# Patient Record
Sex: Male | Born: 1935 | Race: White | Hispanic: No | Smoking: Former smoker
Health system: Southern US, Community
[De-identification: ages and names within clinical notes are randomized; demographics above are authoritative.]

## PROBLEM LIST (undated history)

## (undated) DIAGNOSIS — F0390 Unspecified dementia without behavioral disturbance: Secondary | ICD-10-CM

## (undated) DIAGNOSIS — I2510 Atherosclerotic heart disease of native coronary artery without angina pectoris: Secondary | ICD-10-CM

## (undated) DIAGNOSIS — I213 ST elevation (STEMI) myocardial infarction of unspecified site: Secondary | ICD-10-CM

## (undated) DIAGNOSIS — E785 Hyperlipidemia, unspecified: Secondary | ICD-10-CM

## (undated) DIAGNOSIS — I10 Essential (primary) hypertension: Secondary | ICD-10-CM

## (undated) DIAGNOSIS — I4821 Permanent atrial fibrillation: Secondary | ICD-10-CM

## (undated) DIAGNOSIS — E119 Type 2 diabetes mellitus without complications: Secondary | ICD-10-CM

## (undated) DIAGNOSIS — I350 Nonrheumatic aortic (valve) stenosis: Secondary | ICD-10-CM

## (undated) HISTORY — PX: INGUINAL HERNIA REPAIR: SHX194

## (undated) HISTORY — DX: Essential (primary) hypertension: I10

## (undated) HISTORY — DX: Nonrheumatic aortic (valve) stenosis: I35.0

## (undated) HISTORY — DX: Atherosclerotic heart disease of native coronary artery without angina pectoris: I25.10

## (undated) HISTORY — DX: Hyperlipidemia, unspecified: E78.5

## (undated) HISTORY — PX: HERNIA REPAIR: SHX51

---

## 1996-09-07 HISTORY — PX: CORONARY ARTERY BYPASS GRAFT: SHX141

## 2002-07-17 ENCOUNTER — Ambulatory Visit (HOSPITAL_COMMUNITY): Admission: RE | Admit: 2002-07-17 | Discharge: 2002-07-17 | Payer: Self-pay | Admitting: Family Medicine

## 2002-07-17 ENCOUNTER — Encounter: Payer: Self-pay | Admitting: Family Medicine

## 2002-08-18 ENCOUNTER — Ambulatory Visit (HOSPITAL_COMMUNITY): Admission: RE | Admit: 2002-08-18 | Discharge: 2002-08-18 | Payer: Self-pay | Admitting: Cardiology

## 2002-08-18 ENCOUNTER — Encounter: Payer: Self-pay | Admitting: Cardiology

## 2002-08-30 ENCOUNTER — Encounter (HOSPITAL_COMMUNITY): Admission: RE | Admit: 2002-08-30 | Discharge: 2002-09-29 | Payer: Self-pay | Admitting: *Deleted

## 2002-08-30 ENCOUNTER — Encounter: Payer: Self-pay | Admitting: *Deleted

## 2002-09-18 ENCOUNTER — Encounter: Payer: Self-pay | Admitting: *Deleted

## 2002-09-22 ENCOUNTER — Encounter: Payer: Self-pay | Admitting: *Deleted

## 2002-09-22 ENCOUNTER — Ambulatory Visit (HOSPITAL_COMMUNITY): Admission: RE | Admit: 2002-09-22 | Discharge: 2002-09-23 | Payer: Self-pay | Admitting: *Deleted

## 2002-09-22 ENCOUNTER — Encounter (INDEPENDENT_AMBULATORY_CARE_PROVIDER_SITE_OTHER): Payer: Self-pay | Admitting: *Deleted

## 2003-03-21 ENCOUNTER — Emergency Department (HOSPITAL_COMMUNITY): Admission: EM | Admit: 2003-03-21 | Discharge: 2003-03-21 | Payer: Self-pay | Admitting: Emergency Medicine

## 2003-10-01 ENCOUNTER — Other Ambulatory Visit: Admission: RE | Admit: 2003-10-01 | Discharge: 2003-10-01 | Payer: Self-pay | Admitting: Dermatology

## 2004-02-19 ENCOUNTER — Ambulatory Visit: Payer: Self-pay | Admitting: *Deleted

## 2004-02-20 ENCOUNTER — Ambulatory Visit (HOSPITAL_COMMUNITY): Admission: RE | Admit: 2004-02-20 | Discharge: 2004-02-20 | Payer: Self-pay | Admitting: Family Medicine

## 2005-12-10 ENCOUNTER — Ambulatory Visit: Payer: Self-pay | Admitting: Cardiovascular Disease

## 2005-12-17 ENCOUNTER — Ambulatory Visit: Payer: Self-pay | Admitting: Cardiovascular Disease

## 2005-12-17 ENCOUNTER — Ambulatory Visit (HOSPITAL_COMMUNITY): Admission: RE | Admit: 2005-12-17 | Discharge: 2005-12-18 | Payer: Self-pay | Admitting: Cardiovascular Disease

## 2005-12-22 ENCOUNTER — Ambulatory Visit: Payer: Self-pay | Admitting: Cardiovascular Disease

## 2005-12-23 ENCOUNTER — Ambulatory Visit: Payer: Self-pay | Admitting: Cardiovascular Disease

## 2006-03-16 ENCOUNTER — Ambulatory Visit: Payer: Self-pay | Admitting: Cardiology

## 2006-03-16 ENCOUNTER — Ambulatory Visit (HOSPITAL_COMMUNITY): Admission: RE | Admit: 2006-03-16 | Discharge: 2006-03-17 | Payer: Self-pay | Admitting: Cardiology

## 2006-04-02 ENCOUNTER — Ambulatory Visit: Payer: Self-pay | Admitting: Cardiovascular Disease

## 2006-04-14 ENCOUNTER — Encounter (HOSPITAL_COMMUNITY): Admission: RE | Admit: 2006-04-14 | Discharge: 2006-05-14 | Payer: Self-pay | Admitting: Cardiology

## 2006-04-27 ENCOUNTER — Inpatient Hospital Stay (HOSPITAL_COMMUNITY): Admission: EM | Admit: 2006-04-27 | Discharge: 2006-04-29 | Payer: Self-pay | Admitting: Emergency Medicine

## 2006-04-27 ENCOUNTER — Ambulatory Visit: Payer: Self-pay | Admitting: Internal Medicine

## 2006-05-19 ENCOUNTER — Encounter (HOSPITAL_COMMUNITY): Admission: RE | Admit: 2006-05-19 | Discharge: 2006-06-18 | Payer: Self-pay | Admitting: Cardiology

## 2006-06-03 ENCOUNTER — Ambulatory Visit: Payer: Self-pay | Admitting: Cardiology

## 2006-06-18 ENCOUNTER — Encounter (HOSPITAL_COMMUNITY): Admission: RE | Admit: 2006-06-18 | Discharge: 2006-07-18 | Payer: Self-pay | Admitting: Cardiology

## 2006-07-19 ENCOUNTER — Encounter (HOSPITAL_COMMUNITY): Admission: RE | Admit: 2006-07-19 | Discharge: 2006-08-18 | Payer: Self-pay | Admitting: Cardiology

## 2006-08-03 ENCOUNTER — Ambulatory Visit: Payer: Self-pay | Admitting: Cardiology

## 2006-08-03 LAB — CONVERTED CEMR LAB
Basophils Absolute: 0 10*3/uL (ref 0.0–0.1)
Basophils Relative: 0.1 % (ref 0.0–1.0)
Eosinophils Absolute: 0.3 10*3/uL (ref 0.0–0.6)
Eosinophils Relative: 4.1 % (ref 0.0–5.0)
Hemoglobin: 14.2 g/dL (ref 13.0–17.0)
MCV: 96.7 fL (ref 78.0–100.0)
Monocytes Relative: 6.6 % (ref 3.0–11.0)
Neutro Abs: 5.6 10*3/uL (ref 1.4–7.7)
Neutrophils Relative %: 76.2 % (ref 43.0–77.0)
RBC: 4.22 M/uL (ref 4.22–5.81)

## 2007-01-19 ENCOUNTER — Ambulatory Visit: Payer: Self-pay | Admitting: Cardiology

## 2007-01-19 LAB — CONVERTED CEMR LAB
ALT: 21 units/L (ref 0–53)
AST: 26 units/L (ref 0–37)
Alkaline Phosphatase: 84 units/L (ref 39–117)
Cholesterol: 135 mg/dL (ref 0–200)
Total CHOL/HDL Ratio: 3.8

## 2007-03-21 ENCOUNTER — Ambulatory Visit: Payer: Self-pay | Admitting: Cardiology

## 2007-03-21 LAB — CONVERTED CEMR LAB
Bilirubin, Direct: 0.1 mg/dL (ref 0.0–0.3)
Cholesterol: 136 mg/dL (ref 0–200)
Total Bilirubin: 1.1 mg/dL (ref 0.3–1.2)
Total Protein: 6.5 g/dL (ref 6.0–8.3)
VLDL: 21 mg/dL (ref 0–40)

## 2007-07-06 ENCOUNTER — Ambulatory Visit (HOSPITAL_COMMUNITY): Admission: RE | Admit: 2007-07-06 | Discharge: 2007-07-06 | Payer: Self-pay | Admitting: Family Medicine

## 2007-07-15 ENCOUNTER — Ambulatory Visit: Payer: Self-pay | Admitting: Cardiology

## 2007-07-20 ENCOUNTER — Ambulatory Visit: Payer: Self-pay | Admitting: Cardiology

## 2007-09-14 ENCOUNTER — Ambulatory Visit: Payer: Self-pay | Admitting: Cardiology

## 2008-02-06 ENCOUNTER — Ambulatory Visit (HOSPITAL_COMMUNITY): Admission: RE | Admit: 2008-02-06 | Discharge: 2008-02-06 | Payer: Self-pay | Admitting: Family Medicine

## 2008-03-12 ENCOUNTER — Ambulatory Visit: Payer: Self-pay | Admitting: Cardiology

## 2008-03-12 LAB — CONVERTED CEMR LAB
ALT: 17 units/L (ref 0–53)
AST: 20 units/L (ref 0–37)
Alkaline Phosphatase: 55 units/L (ref 39–117)
LDL Cholesterol: 71 mg/dL (ref 0–99)
Total Protein: 6.3 g/dL (ref 6.0–8.3)

## 2008-03-26 ENCOUNTER — Ambulatory Visit: Payer: Self-pay | Admitting: Cardiology

## 2008-08-20 ENCOUNTER — Telehealth: Payer: Self-pay | Admitting: Cardiology

## 2008-09-11 ENCOUNTER — Ambulatory Visit: Payer: Self-pay | Admitting: Cardiology

## 2008-09-11 ENCOUNTER — Encounter: Payer: Self-pay | Admitting: Cardiology

## 2008-09-11 ENCOUNTER — Ambulatory Visit: Payer: Self-pay

## 2008-09-11 DIAGNOSIS — I350 Nonrheumatic aortic (valve) stenosis: Secondary | ICD-10-CM

## 2008-09-11 DIAGNOSIS — I2510 Atherosclerotic heart disease of native coronary artery without angina pectoris: Secondary | ICD-10-CM

## 2008-09-11 DIAGNOSIS — E782 Mixed hyperlipidemia: Secondary | ICD-10-CM

## 2008-09-19 ENCOUNTER — Telehealth (INDEPENDENT_AMBULATORY_CARE_PROVIDER_SITE_OTHER): Payer: Self-pay | Admitting: *Deleted

## 2008-11-23 ENCOUNTER — Emergency Department (HOSPITAL_COMMUNITY): Admission: EM | Admit: 2008-11-23 | Discharge: 2008-11-23 | Payer: Self-pay | Admitting: Emergency Medicine

## 2008-11-26 ENCOUNTER — Ambulatory Visit: Payer: Self-pay | Admitting: Cardiology

## 2008-11-26 ENCOUNTER — Inpatient Hospital Stay (HOSPITAL_COMMUNITY): Admission: EM | Admit: 2008-11-26 | Discharge: 2008-11-29 | Payer: Self-pay | Admitting: Emergency Medicine

## 2008-12-06 ENCOUNTER — Ambulatory Visit (HOSPITAL_COMMUNITY): Admission: RE | Admit: 2008-12-06 | Discharge: 2008-12-06 | Payer: Self-pay | Admitting: Family Medicine

## 2008-12-19 ENCOUNTER — Telehealth: Payer: Self-pay | Admitting: Cardiology

## 2008-12-19 ENCOUNTER — Encounter: Payer: Self-pay | Admitting: Cardiology

## 2009-01-16 ENCOUNTER — Encounter (INDEPENDENT_AMBULATORY_CARE_PROVIDER_SITE_OTHER): Payer: Self-pay | Admitting: *Deleted

## 2009-01-17 ENCOUNTER — Ambulatory Visit: Payer: Self-pay | Admitting: Cardiology

## 2009-01-17 DIAGNOSIS — I4891 Unspecified atrial fibrillation: Secondary | ICD-10-CM

## 2009-01-23 ENCOUNTER — Encounter: Payer: Self-pay | Admitting: Cardiology

## 2009-01-25 ENCOUNTER — Telehealth: Payer: Self-pay | Admitting: Cardiology

## 2009-01-30 ENCOUNTER — Telehealth (INDEPENDENT_AMBULATORY_CARE_PROVIDER_SITE_OTHER): Payer: Self-pay | Admitting: *Deleted

## 2009-01-30 ENCOUNTER — Encounter (INDEPENDENT_AMBULATORY_CARE_PROVIDER_SITE_OTHER): Payer: Self-pay | Admitting: *Deleted

## 2009-01-30 ENCOUNTER — Ambulatory Visit: Payer: Self-pay | Admitting: Cardiology

## 2009-02-04 LAB — CONVERTED CEMR LAB
CO2: 32 meq/L (ref 19–32)
Calcium: 8.8 mg/dL (ref 8.4–10.5)
Glucose, Bld: 77 mg/dL (ref 70–99)
Sodium: 142 meq/L (ref 135–145)

## 2009-03-27 ENCOUNTER — Ambulatory Visit: Payer: Self-pay | Admitting: Cardiology

## 2009-04-10 ENCOUNTER — Ambulatory Visit: Payer: Self-pay | Admitting: Cardiology

## 2009-06-10 ENCOUNTER — Ambulatory Visit: Payer: Self-pay | Admitting: Cardiology

## 2009-09-16 ENCOUNTER — Ambulatory Visit (HOSPITAL_COMMUNITY): Admission: RE | Admit: 2009-09-16 | Discharge: 2009-09-16 | Payer: Self-pay | Admitting: Cardiology

## 2009-09-16 ENCOUNTER — Ambulatory Visit: Payer: Self-pay

## 2009-09-16 ENCOUNTER — Encounter: Payer: Self-pay | Admitting: Cardiology

## 2009-09-16 ENCOUNTER — Ambulatory Visit: Payer: Self-pay | Admitting: Internal Medicine

## 2009-09-16 ENCOUNTER — Ambulatory Visit: Payer: Self-pay | Admitting: Cardiology

## 2009-09-20 ENCOUNTER — Ambulatory Visit: Payer: Self-pay | Admitting: Cardiology

## 2009-09-23 LAB — CONVERTED CEMR LAB
BUN: 19 mg/dL (ref 6–23)
CO2: 31 meq/L (ref 19–32)
Calcium: 8.6 mg/dL (ref 8.4–10.5)
Chloride: 107 meq/L (ref 96–112)
Creatinine, Ser: 1.3 mg/dL (ref 0.4–1.5)
Glucose, Bld: 87 mg/dL (ref 70–99)

## 2009-10-04 ENCOUNTER — Ambulatory Visit (HOSPITAL_COMMUNITY): Admission: RE | Admit: 2009-10-04 | Discharge: 2009-10-04 | Payer: Self-pay | Admitting: Internal Medicine

## 2010-03-25 ENCOUNTER — Encounter: Payer: Self-pay | Admitting: Cardiology

## 2010-03-25 ENCOUNTER — Ambulatory Visit: Payer: Self-pay | Admitting: Cardiology

## 2010-03-26 ENCOUNTER — Encounter: Payer: Self-pay | Admitting: Cardiology

## 2010-04-01 LAB — CONVERTED CEMR LAB
ALT: 15 units/L (ref 0–53)
Alkaline Phosphatase: 97 units/L (ref 39–117)
BUN: 17 mg/dL (ref 6–23)
CO2: 31 meq/L (ref 19–32)
Cholesterol: 143 mg/dL (ref 0–200)
Indirect Bilirubin: 0.4 mg/dL (ref 0.0–0.9)
LDL Cholesterol: 78 mg/dL (ref 0–99)
Sodium: 144 meq/L (ref 135–145)
Total Protein: 6.8 g/dL (ref 6.0–8.3)
Triglycerides: 122 mg/dL (ref <150)

## 2010-05-11 LAB — CONVERTED CEMR LAB
BUN: 24 mg/dL — ABNORMAL HIGH (ref 6–23)
Basophils Relative: 0.9 % (ref 0.0–3.0)
CO2: 33 meq/L — ABNORMAL HIGH (ref 19–32)
CO2: 34 meq/L — ABNORMAL HIGH (ref 19–32)
Chloride: 99 meq/L (ref 96–112)
Creatinine, Ser: 1.4 mg/dL (ref 0.4–1.5)
Eosinophils Absolute: 0.4 10*3/uL (ref 0.0–0.7)
Eosinophils Relative: 7.6 % — ABNORMAL HIGH (ref 0.0–5.0)
GFR calc non Af Amer: 51.39 mL/min (ref >60)
GFR calc non Af Amer: 61.91 mL/min (ref >60)
Glucose, Bld: 90 mg/dL (ref 70–99)
Glucose, Bld: 92 mg/dL (ref 70–99)
HCT: 36 % — ABNORMAL LOW (ref 39.0–52.0)
Lymphocytes Relative: 20.5 % (ref 12.0–46.0)
Lymphs Abs: 1.2 10*3/uL (ref 0.7–4.0)
Monocytes Absolute: 0.5 10*3/uL (ref 0.1–1.0)
Neutro Abs: 3.5 10*3/uL (ref 1.4–7.7)
Neutrophils Relative %: 62.5 % (ref 43.0–77.0)
RDW: 13.7 % (ref 11.5–14.6)
Sodium: 140 meq/L (ref 135–145)

## 2010-05-13 NOTE — Assessment & Plan Note (Signed)
Summary: ROV   Visit Type:  Follow-up Primary Provider:  cresenzo  CC:  No complains.  History of Present Illness: Overall doing well.  Denies any chest pain.  No shortness of breath.  Has a garden, and has still quite a bit.   Current Medications (verified): 1)  Crestor 10 Mg Tabs (Rosuvastatin Calcium) .... Take 1 Tablet By Mouth Once A Day 2)  Metoprolol Succinate 50 Mg Xr24h-Tab (Metoprolol Succinate) .... Take One Half  Tablet By Mouth Daily 3)  Trandolapril 2 Mg Tabs (Trandolapril) .... Take 1 Tablet By Mouth Once A Day 4)  Diltiazem Hcl Er Beads 120 Mg Xr24h-Cap (Diltiazem Hcl Er Beads) .... Take 1 Capsule By Mouth Once A Day 5)  Warfarin Sodium 5 Mg Tabs (Warfarin Sodium) .... Use As Directed By Anticoagulation Clinic 6)  Omeprazole 20 Mg Cpdr (Omeprazole) .... Take 2 By Mouth Once Daily 7)  Furosemide 20 Mg Tabs (Furosemide) .... Take One-Half Tablet By Mouth Daily 8)  Aspirin 81 Mg Tbec (Aspirin) .... Take One Tablet By Mouth Daily  Allergies: 1)  ! Penicillin  Vital Signs:  Patient profile:   77 year old male Height:      67 inches Weight:      161.25 pounds BMI:     25.35 Pulse rate:   51 / minute Pulse rhythm:   regular Resp:     18 per minute BP sitting:   120 / 60  (left arm) Cuff size:   regular  Vitals Entered By: Vikki Ports (September 16, 2009 10:55 AM)  Physical Exam  General:  Well developed, well nourished, in no acute distress. Head:  normocephalic and atraumatic Eyes:  PERRLA/EOM intact; conjunctiva and lids normal. Lungs:  Clear bilaterally to auscultation and percussion. Heart:  Regular. PMI non displaced.  Normal S1 and S2.  SEM 2/6 ursb.  No DM.   Abdomen:  Bowel sounds positive; abdomen soft and non-tender without masses, organomegaly, or hernias noted. No hepatosplenomegaly. Extremities:  No clubbing or cyanosis. Neurologic:  Alert and oriented x 3.   EKG  Procedure date:  09/16/2009  Findings:      SB.  RBBB..    Impression &  Recommendations:  Problem # 1:  ATRIAL FIBRILLATION (ICD-427.31)  Controlled.  On low doses.  Cannot consider afording dabigatran.  Wants to remain on warfarin.  PT at [facility] medical His updated medication list for this problem includes:    Metoprolol Succinate 50 Mg Xr24h-tab (Metoprolol succinate) .Marland Kitchen... Take one half  tablet by mouth daily    Warfarin Sodium 5 Mg Tabs (Warfarin sodium) ..... Use as directed by anticoagulation clinic    Aspirin 81 Mg Tbec (Aspirin) .Marland Kitchen... Take one tablet by mouth daily  Orders: EKG w/ Interpretation (93000) TLB-BMP (Basic Metabolic Panel-BMET) (80048-METABOL)  Problem # 2:  AORTIC STENOSIS/ INSUFFICIENCY, NON-RHEUMATIC (ICD-424.1)  Mild by exam.   His updated medication list for this problem includes:    Metoprolol Succinate 50 Mg Xr24h-tab (Metoprolol succinate) .Marland Kitchen... Take one half  tablet by mouth daily    Trandolapril 2 Mg Tabs (Trandolapril) .Marland Kitchen... Take 1 tablet by mouth once a day    Furosemide 20 Mg Tabs (Furosemide) .Marland Kitchen... Take one-half tablet by mouth daily  Orders: EKG w/ Interpretation (93000) TLB-BMP (Basic Metabolic Panel-BMET) (80048-METABOL)  Problem # 3:  CAD, NATIVE VESSEL (ICD-414.01)  Stable.  No recurrent symptoms.  His updated medication list for this problem includes:    Metoprolol Succinate 50 Mg Xr24h-tab (Metoprolol succinate) .Marland Kitchen... Take  one half  tablet by mouth daily    Trandolapril 2 Mg Tabs (Trandolapril) .Marland Kitchen... Take 1 tablet by mouth once a day    Diltiazem Hcl Er Beads 120 Mg Xr24h-cap (Diltiazem hcl er beads) .Marland Kitchen... Take 1 capsule by mouth once a day    Warfarin Sodium 5 Mg Tabs (Warfarin sodium) ..... Use as directed by anticoagulation clinic    Aspirin 81 Mg Tbec (Aspirin) .Marland Kitchen... Take one tablet by mouth daily  Orders: EKG w/ Interpretation (93000) TLB-BMP (Basic Metabolic Panel-BMET) (80048-METABOL)  Problem # 4:  HYPERCHOLESTEROLEMIA  IIA (ICD-272.0)  Followup labs eventually.  His updated medication list for  this problem includes:    Crestor 10 Mg Tabs (Rosuvastatin calcium) .Marland Kitchen... Take 1 tablet by mouth once a day  Orders: EKG w/ Interpretation (93000) TLB-BMP (Basic Metabolic Panel-BMET) (80048-METABOL)  Patient Instructions: 1)  Your physician recommends that you continue on your current medications as directed. Please refer to the Current Medication list given to you today. 2)  Your physician wants you to follow-up in:  6 MONTHS.  You will receive a reminder letter in the mail two months in advance. If you don't receive a letter, please call our office to schedule the follow-up appointment. 3)  Your physician recommends that you have lab work today: BMP

## 2010-05-13 NOTE — Assessment & Plan Note (Signed)
Summary: Aaron [name]   Visit Type:  2 months follow up  CC:  No cardiac complains.  History of Present Illness: Overall doing well.  He played golf yesterday, danced three nights in a row, put five loads of wood under the shed.  He is not going to plant a big garden.  No exertional symptoms.    Current Medications (verified): 1)  Crestor 10 Mg Tabs (Rosuvastatin Calcium) .... Take 1 Tablet By Mouth Once A Day 2)  Metoprolol Succinate 50 Mg Xr24h-Tab (Metoprolol Succinate) .... Take One Half  Tablet By Mouth Daily 3)  Trandolapril 2 Mg Tabs (Trandolapril) .... Take 1 Tablet By Mouth Once A Day 4)  Diltiazem Hcl Er Beads 120 Mg Xr24h-Cap (Diltiazem Hcl Er Beads) .... Take 1 Capsule By Mouth Once A Day 5)  Warfarin Sodium 5 Mg Tabs (Warfarin Sodium) .... Use As Directed By Anticoagulation Clinic 6)  Omeprazole 20 Mg Cpdr (Omeprazole) .... Take 2 By Mouth Once Daily 7)  Furosemide 20 Mg Tabs (Furosemide) .... Take One-Half Tablet By Mouth Daily 8)  Aspirin 81 Mg Tbec (Aspirin) .... Take One Tablet By Mouth Daily  Allergies: 1)  ! Penicillin  Vital Signs:  Patient profile:   77 year old male Height:      67 inches Weight:      166.50 pounds Pulse rate:   56 / minute Pulse rhythm:   irregular Resp:     18 per minute BP sitting:   112 / 58  (left arm) Cuff size:   large  Vitals Entered By: Vikki Ports (June 10, 2009 2:23 PM)  Physical Exam  General:  Well developed, well nourished, in no acute distress. Lungs:  Clear bilaterally to auscultation and percussion. Heart:  PMI non displaced.  SEM  3/6 mid to late peaking.  No rub or diastolic murmur. Abdomen:  Bowel sounds positive; abdomen soft and non-tender without masses, organomegaly, or hernias noted. No hepatosplenomegaly. Pulses:  pulses normal in all 4 extremities Extremities:  No clubbing or cyanosis. Neurologic:  Alert and oriented x 3.   EKG  Procedure date:  06/10/2009  Findings:      NSR.  RBBB  Impression &  Recommendations:  Problem # 1:  CAD, NATIVE VESSEL (ICD-414.01) Doing well.  No major symptoms. His updated medication list for this problem includes:    Metoprolol Succinate 50 Mg Xr24h-tab (Metoprolol succinate) .Marland Kitchen... Take one half  tablet by mouth daily    Trandolapril 2 Mg Tabs (Trandolapril) .Marland Kitchen... Take 1 tablet by mouth once a day    Diltiazem Hcl Er Beads 120 Mg Xr24h-cap (Diltiazem hcl er beads) .Marland Kitchen... Take 1 capsule by mouth once a day    Warfarin Sodium 5 Mg Tabs (Warfarin sodium) ..... Use as directed by anticoagulation clinic    Aspirin 81 Mg Tbec (Aspirin) .Marland Kitchen... Take one tablet by mouth daily  Problem # 2:  AORTIC STENOSIS/ INSUFFICIENCY, NON-RHEUMATIC (ICD-424.1) Murmur consistent.  No symptoms at present.  Does not think he would take surgery anyway.  Check echo in three months.  His updated medication list for this problem includes:    Metoprolol Succinate 50 Mg Xr24h-tab (Metoprolol succinate) .Marland Kitchen... Take one half  tablet by mouth daily    Trandolapril 2 Mg Tabs (Trandolapril) .Marland Kitchen... Take 1 tablet by mouth once a day    Furosemide 20 Mg Tabs (Furosemide) .Marland Kitchen... Take one-half tablet by mouth daily  Problem # 3:  ATRIAL FIBRILLATION (ICD-427.31) Tolerating well. His updated medication list for this problem  includes:    Metoprolol Succinate 50 Mg Xr24h-tab (Metoprolol succinate) .Marland Kitchen... Take one half  tablet by mouth daily    Warfarin Sodium 5 Mg Tabs (Warfarin sodium) ..... Use as directed by anticoagulation clinic    Aspirin 81 Mg Tbec (Aspirin) .Marland Kitchen... Take one tablet by mouth daily  Problem # 4:  HYPERCHOLESTEROLEMIA  IIA (ICD-272.0)  Continue same. His updated medication list for this problem includes:    Crestor 10 Mg Tabs (Rosuvastatin calcium) .Marland Kitchen... Take 1 tablet by mouth once a day  His updated medication list for this problem includes:    Crestor 10 Mg Tabs (Rosuvastatin calcium) .Marland Kitchen... Take 1 tablet by mouth once a day  Other Orders: EKG w/ Interpretation  (93000) Echocardiogram (Echo)  Patient Instructions: 1)  Your physician recommends that you schedule a follow-up appointment in: 3 MONTHS 2)  Your physician has requested that you have an echocardiogram in 3 MONTHS.  Echocardiography is a painless test that uses sound waves to create images of your heart. It provides your doctor with information about the size and shape of your heart and how well your heart's chambers and valves are working.  This procedure takes approximately one hour. There are no restrictions for this procedure.

## 2010-05-15 NOTE — Assessment & Plan Note (Signed)
Summary: f21m   Visit Type:  Follow-up Primary Provider:  cresenzo  CC:  no conplaints.  History of Present Illness: Doing well.  Goes dancing.  Gets a little short of breath with square dancing, but not with the two step.  Denies chest pain.  No syncope or presyncope.   Current Problems (verified): 1)  Atrial Fibrillation  (ICD-427.31) 2)  Aortic Stenosis/ Insufficiency, Non-rheumatic  (ICD-424.1) 3)  Cad, Native Vessel  (ICD-414.01) 4)  Hypercholesterolemia Iia  (ICD-272.0)  Current Medications (verified): 1)  Crestor 10 Mg Tabs (Rosuvastatin Calcium) .... Take 1 Tablet By Mouth Once A Day 2)  Metoprolol Succinate 50 Mg Xr24h-Tab (Metoprolol Succinate) .... Take One Half  Tablet By Mouth Daily 3)  Trandolapril 2 Mg Tabs (Trandolapril) .... Take 1 Tablet By Mouth Once A Day 4)  Diltiazem Hcl Er Beads 120 Mg Xr24h-Cap (Diltiazem Hcl Er Beads) .... Take 1 Capsule By Mouth Once A Day 5)  Warfarin Sodium 5 Mg Tabs (Warfarin Sodium) .... Use As Directed By Anticoagulation Clinic 6)  Omeprazole 20 Mg Cpdr (Omeprazole) .... Take 2 By Mouth Once Daily 7)  Furosemide 20 Mg Tabs (Furosemide) .... Take One-Half Tablet By Mouth Daily 8)  Aspirin 81 Mg Tbec (Aspirin) .... Take One Tablet By Mouth Daily 9)  Ferrous Sulfate 325 (65 Fe) Mg Tbec (Ferrous Sulfate) .Marland Kitchen.. 1 By Mouth Daily  Allergies (verified): 1)  ! Penicillin  Past History:  Past Medical History: Last updated: 01/17/2009 PCI-PTCA (S/P) 1993 PCI with DES  ([place]) 2008 CAD S/p CABG Aortic Stenosis Hypertension hyperlipidemia gout PCI-DES (S/P)  04/28/2006 for instent restenosis CFX PCI-BMS (S/P)  12/17/2005 bifurcation CFX PCI-PTCA (S/P)  03/2006 for ISR of non DES  Vital Signs:  Patient profile:   77 year old male Height:      67 inches Weight:      162 pounds BMI:     25.46 Pulse rate:   41 / minute Resp:     16 per minute BP sitting:   143 / 71  (right arm)  Vitals Entered By: Marrion Coy, CNA (March 25, 2010  11:21 AM)  Physical Exam  General:  Well developed, well nourished, in no acute distress. Head:  normocephalic and atraumatic Eyes:  PERRLA/EOM intact; conjunctiva and lids normal. Lungs:  Clear bilaterally to auscultation and percussion. Heart:  2-3/6 SEM.  Normal S1 and S2.  S2 not fixed.  No DM.  No rub. PMI normal. Abdomen:  Bowel sounds positive; abdomen soft and non-tender without masses, organomegaly, or hernias noted. No hepatosplenomegaly.  Umbilical hernia present.  Pulses:  pulses normal in all 4 extremities Extremities:  No clubbing or cyanosis. Neurologic:  Alert and oriented x 3.   EKG  Procedure date:  03/25/2010  Findings:      SB. RBBB.  No acute changes.   Impression & Recommendations:  Problem # 1:  CAD, NATIVE VESSEL (ICD-414.01)  stable at present.  No recurrent angina. Continue current medication. His updated medication list for this problem includes:    Metoprolol Succinate 50 Mg Xr24h-tab (Metoprolol succinate) .Marland Kitchen... Take one half  tablet by mouth daily    Trandolapril 2 Mg Tabs (Trandolapril) .Marland Kitchen... Take 1 tablet by mouth once a day    Diltiazem Hcl Er Beads 120 Mg Xr24h-cap (Diltiazem hcl er beads) .Marland Kitchen... Take 1 capsule by mouth once a day    Warfarin Sodium 5 Mg Tabs (Warfarin sodium) ..... Use as directed by anticoagulation clinic    Aspirin 81 Mg Tbec (  Aspirin) .Marland Kitchen... Take one tablet by mouth daily  His updated medication list for this problem includes:    Metoprolol Succinate 50 Mg Xr24h-tab (Metoprolol succinate) .Marland Kitchen... Take one half  tablet by mouth daily    Trandolapril 2 Mg Tabs (Trandolapril) .Marland Kitchen... Take 1 tablet by mouth once a day    Diltiazem Hcl Er Beads 120 Mg Xr24h-cap (Diltiazem hcl er beads) .Marland Kitchen... Take 1 capsule by mouth once a day    Warfarin Sodium 5 Mg Tabs (Warfarin sodium) ..... Use as directed by anticoagulation clinic    Aspirin 81 Mg Tbec (Aspirin) .Marland Kitchen... Take one tablet by mouth daily  Orders: EKG w/ Interpretation  (93000)  Problem # 2:  AORTIC STENOSIS/ INSUFFICIENCY, NON-RHEUMATIC (ICD-424.1)  moderate by exam and echo.  Repeat exam and study in six months. His updated medication list for this problem includes:    Metoprolol Succinate 50 Mg Xr24h-tab (Metoprolol succinate) .Marland Kitchen... Take one half  tablet by mouth daily    Trandolapril 2 Mg Tabs (Trandolapril) .Marland Kitchen... Take 1 tablet by mouth once a day    Furosemide 20 Mg Tabs (Furosemide) .Marland Kitchen... Take one-half tablet by mouth daily  His updated medication list for this problem includes:    Metoprolol Succinate 50 Mg Xr24h-tab (Metoprolol succinate) .Marland Kitchen... Take one half  tablet by mouth daily    Trandolapril 2 Mg Tabs (Trandolapril) .Marland Kitchen... Take 1 tablet by mouth once a day    Furosemide 20 Mg Tabs (Furosemide) .Marland Kitchen... Take one-half tablet by mouth daily  Orders: EKG w/ Interpretation (93000)  Problem # 3:  ATRIAL FIBRILLATION (ICD-427.31)  No obvious recurrene.  Need to watch rate.  On calcium and beta blockade.  Recheck at next OV. His updated medication list for this problem includes:    Metoprolol Succinate 50 Mg Xr24h-tab (Metoprolol succinate) .Marland Kitchen... Take one half  tablet by mouth daily    Warfarin Sodium 5 Mg Tabs (Warfarin sodium) ..... Use as directed by anticoagulation clinic    Aspirin 81 Mg Tbec (Aspirin) .Marland Kitchen... Take one tablet by mouth daily  Orders: EKG w/ Interpretation (93000)  Problem # 4:  HYPERCHOLESTEROLEMIA  IIA (ICD-272.0)  Needs lipid and liver profile  His updated medication list for this problem includes:    Crestor 10 Mg Tabs (Rosuvastatin calcium) .Marland Kitchen... Take 1 tablet by mouth once a day  His updated medication list for this problem includes:    Crestor 10 Mg Tabs (Rosuvastatin calcium) .Marland Kitchen... Take 1 tablet by mouth once a day  Patient Instructions: 1)  Your physician recommends that you have a FASTING LIPID, LIVER and BMP. Nothing to eat or drink after midnight.  (Order given to patient) 2)  Your physician recommends that you  continue on your current medications as directed. Please refer to the Current Medication list given to you today. 3)  Your physician wants you to follow-up in: 6 MONTHS.   You will receive a reminder letter in the mail two months in advance. If you don't receive a letter, please call our office to schedule the follow-up appointment. 4)  Your physician has requested that you have an echocardiogram in 6 MONTHS.  Echocardiography is a painless test that uses sound waves to create images of your heart. It provides your doctor with information about the size and shape of your heart and how well your heart's chambers and valves are working.  This procedure takes approximately one hour. There are no restrictions for this procedure.

## 2010-07-19 LAB — CBC
HCT: 40 % (ref 39.0–52.0)
HCT: 41.5 % (ref 39.0–52.0)
Hemoglobin: 11.3 g/dL — ABNORMAL LOW (ref 13.0–17.0)
Hemoglobin: 13.8 g/dL (ref 13.0–17.0)
Hemoglobin: 14.2 g/dL (ref 13.0–17.0)
MCHC: 33.9 g/dL (ref 30.0–36.0)
MCHC: 33.9 g/dL (ref 30.0–36.0)
MCHC: 34.2 g/dL (ref 30.0–36.0)
MCHC: 34.3 g/dL (ref 30.0–36.0)
MCV: 98.7 fL (ref 78.0–100.0)
Platelets: 125 10*3/uL — ABNORMAL LOW (ref 150–400)
Platelets: 137 10*3/uL — ABNORMAL LOW (ref 150–400)
Platelets: 146 10*3/uL — ABNORMAL LOW (ref 150–400)
Platelets: 186 10*3/uL (ref 150–400)
Platelets: 131 10*3/uL — ABNORMAL LOW (ref 150–400)
RBC: 3.47 MIL/uL — ABNORMAL LOW (ref 4.22–5.81)
RBC: 4.06 MIL/uL — ABNORMAL LOW (ref 4.22–5.81)
RBC: 4.2 MIL/uL — ABNORMAL LOW (ref 4.22–5.81)
RDW: 14.2 % (ref 11.5–15.5)
RDW: 14.3 % (ref 11.5–15.5)
RDW: 13.8 % (ref 11.5–15.5)
WBC: 5.6 10*3/uL (ref 4.0–10.5)
WBC: 6.7 10*3/uL (ref 4.0–10.5)
WBC: 6.5 10*3/uL (ref 4.0–10.5)

## 2010-07-19 LAB — BASIC METABOLIC PANEL
BUN: 22 mg/dL (ref 6–23)
BUN: 41 mg/dL — ABNORMAL HIGH (ref 6–23)
BUN: 45 mg/dL — ABNORMAL HIGH (ref 6–23)
CO2: 26 mEq/L (ref 19–32)
CO2: 27 mEq/L (ref 19–32)
CO2: 28 mEq/L (ref 19–32)
Calcium: 8.3 mg/dL — ABNORMAL LOW (ref 8.4–10.5)
Calcium: 8.3 mg/dL — ABNORMAL LOW (ref 8.4–10.5)
Calcium: 8.4 mg/dL (ref 8.4–10.5)
Calcium: 8.1 mg/dL — ABNORMAL LOW (ref 8.4–10.5)
Chloride: 100 mEq/L (ref 96–112)
Chloride: 100 mEq/L (ref 96–112)
Creatinine, Ser: 1.48 mg/dL (ref 0.4–1.5)
Creatinine, Ser: 1.51 mg/dL — ABNORMAL HIGH (ref 0.4–1.5)
Creatinine, Ser: 1.8 mg/dL — ABNORMAL HIGH (ref 0.4–1.5)
Creatinine, Ser: 1.88 mg/dL — ABNORMAL HIGH (ref 0.4–1.5)
GFR calc Af Amer: 54 mL/min — ABNORMAL LOW (ref >60)
GFR calc Af Amer: 59 mL/min — ABNORMAL LOW (ref >60)
GFR calc Af Amer: 42 mL/min — ABNORMAL LOW (ref >60)
GFR calc non Af Amer: 36 mL/min — ABNORMAL LOW (ref >60)
GFR calc non Af Amer: 45 mL/min — ABNORMAL LOW (ref >60)
GFR calc non Af Amer: 49 mL/min — ABNORMAL LOW (ref >60)
GFR calc non Af Amer: 34 mL/min — ABNORMAL LOW (ref >60)
Glucose, Bld: 105 mg/dL — ABNORMAL HIGH (ref 70–99)
Potassium: 4.1 mEq/L (ref 3.5–5.1)
Sodium: 139 mEq/L (ref 135–145)
Sodium: 139 mEq/L (ref 135–145)

## 2010-07-19 LAB — CARDIAC PANEL(CRET KIN+CKTOT+MB+TROPI)
CK, MB: 3.5 ng/mL (ref 0.3–4.0)
CK, MB: 4.8 ng/mL — ABNORMAL HIGH (ref 0.3–4.0)
CK, MB: 6.1 ng/mL — ABNORMAL HIGH (ref 0.3–4.0)
Relative Index: 1.4 (ref 0.0–2.5)
Relative Index: 1.7 (ref 0.0–2.5)
Relative Index: 2.1 (ref 0.0–2.5)
Total CK: 283 U/L — ABNORMAL HIGH (ref 7–232)
Troponin I: 0.08 ng/mL — ABNORMAL HIGH (ref 0.00–0.06)
Troponin I: 0.11 ng/mL — ABNORMAL HIGH (ref 0.00–0.06)

## 2010-07-19 LAB — D-DIMER, QUANTITATIVE: D-Dimer, Quant: 3.91 ug/mL-FEU — ABNORMAL HIGH (ref 0.00–0.48)

## 2010-07-19 LAB — BASIC METABOLIC PANEL WITH GFR
CO2: 23 meq/L (ref 19–32)
Glucose, Bld: 125 mg/dL — ABNORMAL HIGH (ref 70–99)
Potassium: 4.1 meq/L (ref 3.5–5.1)
Sodium: 131 meq/L — ABNORMAL LOW (ref 135–145)

## 2010-07-19 LAB — PROTIME-INR
INR: 1.2 (ref 0.00–1.49)
INR: 1.9 — ABNORMAL HIGH (ref 0.00–1.49)
Prothrombin Time: 15 seconds (ref 11.6–15.2)
Prothrombin Time: 21.2 seconds — ABNORMAL HIGH (ref 11.6–15.2)

## 2010-07-19 LAB — DIFFERENTIAL
Basophils Absolute: 0 K/uL (ref 0.0–0.1)
Basophils Relative: 0 % (ref 0–1)
Eosinophils Absolute: 0.1 10*3/uL (ref 0.0–0.7)
Eosinophils Relative: 3 % (ref 0–5)
Eosinophils Relative: 2 % (ref 0–5)
Lymphocytes Relative: 6 % — ABNORMAL LOW (ref 12–46)
Lymphs Abs: 0.4 10*3/uL — ABNORMAL LOW (ref 0.7–4.0)
Monocytes Absolute: 0.5 10*3/uL (ref 0.1–1.0)
Monocytes Absolute: 0.3 K/uL (ref 0.1–1.0)
Monocytes Relative: 8 % (ref 3–12)
Monocytes Relative: 5 % (ref 3–12)
Neutro Abs: 5.5 10*3/uL (ref 1.7–7.7)
Neutro Abs: 5.7 10*3/uL (ref 1.7–7.7)
Neutrophils Relative %: 88 % — ABNORMAL HIGH (ref 43–77)

## 2010-07-19 LAB — BRAIN NATRIURETIC PEPTIDE: Pro B Natriuretic peptide (BNP): 570 pg/mL — ABNORMAL HIGH (ref 0.0–100.0)

## 2010-07-19 LAB — TSH: TSH: 1.987 u[IU]/mL (ref 0.350–4.500)

## 2010-07-20 LAB — BASIC METABOLIC PANEL
BUN: 25 mg/dL — ABNORMAL HIGH (ref 6–23)
Chloride: 99 mEq/L (ref 96–112)
Creatinine, Ser: 1.63 mg/dL — ABNORMAL HIGH (ref 0.4–1.5)
Glucose, Bld: 163 mg/dL — ABNORMAL HIGH (ref 70–99)
Potassium: 4.1 mEq/L (ref 3.5–5.1)

## 2010-07-20 LAB — CBC
HCT: 42.1 % (ref 39.0–52.0)
MCV: 98 fL (ref 78.0–100.0)
Platelets: 119 10*3/uL — ABNORMAL LOW (ref 150–400)
RDW: 13.9 % (ref 11.5–15.5)

## 2010-07-20 LAB — DIFFERENTIAL
Basophils Absolute: 0 10*3/uL (ref 0.0–0.1)
Basophils Relative: 0 % (ref 0–1)
Eosinophils Absolute: 0.1 10*3/uL (ref 0.0–0.7)
Eosinophils Relative: 2 % (ref 0–5)
Neutrophils Relative %: 85 % — ABNORMAL HIGH (ref 43–77)

## 2010-07-20 LAB — URINALYSIS, ROUTINE W REFLEX MICROSCOPIC
Glucose, UA: NEGATIVE mg/dL
Leukocytes, UA: NEGATIVE
Protein, ur: 100 mg/dL — AB
Specific Gravity, Urine: 1.025 (ref 1.005–1.030)
Urobilinogen, UA: 0.2 mg/dL (ref 0.0–1.0)

## 2010-07-20 LAB — URINE MICROSCOPIC-ADD ON

## 2010-08-26 NOTE — Discharge Summary (Signed)
NAMELENVILLE, HIBBERD           ACCOUNT NO.:  [id number]   MEDICAL RECORD NO.:  [id number]          PATIENT TYPE:  INP   LOCATION:  2020                         FACILITY:  [facility]   PHYSICIAN:  Richarda Overlie, MD       DATE OF BIRTH:  Sep 02, 1923   DATE OF ADMISSION:  11/25/2008  DATE OF DISCHARGE:  11/29/2008                               DISCHARGE SUMMARY   PRIMARY CARE PHYSICIAN:  Patrica Duel, M.D.   DISCHARGE DIAGNOSES:  1. Community-acquired pneumonia.  2. New onset atrial fibrillation.  3. Dyslipidemia.  4. Hypertension.  5. Mild aortic stenosis.  6. Psoriasis  7. Hyponatremia, resolved.  8. Mild dehydration.   SUBJECTIVE:  This is an 77 year old male who presents to the ER with the  chief complaint of fever, chills and rigors and cough.  The patient was  at [facility] emergency room on August 13 and was diagnosed with  pneumonia, was given one injection of Rocephin and was placed on  azithromycin to be continued as an outpatient but the patient continued  to have worsening of the symptoms with continued fever, chills and  rigors and subsequently presented to [facility] on the 16th of this  month.  At the time of presentation, the patient was found to have a  fever of 100.2, was mildly tachycardic, 95% on room air.  The patient  was also found to be in new onset atrial fibrillation with rapid  ventricular response.   The patient had an initial EKG that showed atrial fibrillation with a  rate of 111 and right bundle branch block.   HOSPITAL COURSE.:  1. Community-acquired pneumonia.  The patient was started on      ceftriaxone and azithromycin with significant improvement in the      patient's pulmonary symptoms.  He has remained afebrile during the      course of his hospitalization.  The patient was also treated with      Xopenex nebulizer as needed and he is being provided with meter      dose inhalers which he will continue to use at home.  The patient      was  also found to be in new onset atrial fibrillation which was      contributing to his shortness of breath. As far as oxygen      requirements are concerned, the patient was ambulated prior to      discharge and will be discharged home on oxygen if his ambulatory      oxygen saturation in room air is less than 90%.   1. Atrial fibrillation.  The patient was found to be in atrial      fibrillation with rapid ventricular response.  The patient had a      cardiology consultation by Dr. Charlies Constable.  His primary      cardiologist is Dr. Shawnie Pons.  The patient was initiated on a      Cardizem drip IV initially and was subsequently switched to p.o.      Cardizem with good rate control.  The patient also had a VQ scan  that was found to be low probability for acute PE.  The patient was      initiated on Coumadin because of a CHADS score of 2 and during his      hospitalization he was bridged with Lovenox.  The patient received      2 doses of Coumadin 7.5 mg each and his INR jumped from 1.2 to 1.9      within 2 days.  The dose of his Coumadin on 11/29/2008 was held.      The patient's Lovenox was discontinued.  The patient is being      provided with home health R.N. who will closely monitor the      patient's INR and the dose of the patient's Coumadin needs to be      adjusted fairly acutely following discharge and this needs to be      coordinated by the patient's primary care Jahniya Duzan is Dr. Patrica Duel.  The patient had a cardiac echo done in June 2010 that      did not show any significant valvular heart disease.  He had a      normal systolic function at that time.  Therefore repeat cardiac      echo was not done.   DISCHARGE MEDICATIONS:  1. Coumadin 6 mg p.o. daily to be started on 11/30/2008.  2. Avelox 400 mg p.o. daily for 5 days.  3. Cardizem CD 240 mg p.o. daily.  4. Lasix 40 mg p.o. daily.  5. Protonix 40 mg p.o. daily.  6. Combivent MDI 2 puffs q.4 hours  p.r.n.  7. Metoprolol 50 mg p.o. twice a day.  8. Aspirin 81 mg p.o. daily.  9. Mavik 2 mg p.o. daily.  10.Calcium (Os-Cal) 600 mg with vitamin D twice a day.  11.Crestor on 10 mg p.o. daily.      Richarda Overlie, MD  Electronically Signed     NA/MEDQ  D:  11/29/2008  T:  11/29/2008  Job:  [id number]

## 2010-08-26 NOTE — Consult Note (Signed)
Aaron [name], Aaron [name]           ACCOUNT NO.:  [id number]   MEDICAL RECORD NO.:  [id number]          PATIENT TYPE:  INP   LOCATION:  2020                         FACILITY:  [facility]   PHYSICIAN:  Everardo Beals. Juanda Chance, MD, FACCDATE OF BIRTH:  09/20/1923   DATE OF CONSULTATION:  11/26/2008  DATE OF DISCHARGE:                                 CONSULTATION   PRIMARY CARE PHYSICIAN:  Patrica Duel, MD   PRIMARY CARDIOLOGIST:  Arturo Morton. Riley Kill, MD, [facility]   CHIEF COMPLAINT:  AFib, RVR.   HISTORY OF PRESENT ILLNESS:  Mr. Aaron [name] is an 77 year old male with a  history of coronary artery disease.  He came to the hospital secondary  to presyncope.  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  He initially went to  [facility] on November 23, 2008, and was diagnosed with community-  acquired pneumonia.  He was put on antibiotics and discharged home.  When his symptoms worsened with fevers and poor p.o. intake, his family  brought him to [facility] on November 25, 2008, where he was admitted for  further evaluation and treatment.  He was noted to be in AFib with rapid  heart rate at times and Cardiology was asked to evaluate him.   Mr. Aaron [name] denies chest pain or shortness of breath.  He admits to  coughing but denies wheezing, although he does wheeze audibly.  He  states he has had palpitations in the past once or twice a week that are  brief and last only a few minutes.  He denies symptoms with these.  He  says he has had none in the last day or so and is not aware of an  irregular heart rate now.  He says he feels better than on admission but  is confused and oriented to name but not day, date, or place.  According  to the nursing staff earlier, he knew he was in the hospital but is not  remembering instructions well and is trying to get out of bed and seems  agitated at times.   PAST MEDICAL HISTORY:  1. Status post aortocoronary  bypass surgery in 1995 with LIMA to LAD.  2. Status post percutaneous intervention to the circumflex and OM      bifurcation in September and December 2007 as well as in January      2008.  3. Status post cardiac catheterization in January 2008 showing left      main patent, LAD totaled, LIMA to LAD patent, OM stent 95% reduced      to 10% with a drug-eluting stent in the AV branch, 90% reduced to      60% with PTCA, RCA 75% and EF 60%.  4. History of aortic stenosis with a valve area of 1.2 cm2, grade 1      diastolic dysfunction, mild mitral regurgitation, PA is 37 and a      peak gradient of 26 mmHg, mean gradient 14 mmHg by echocardiogram      in June 2010.  5. History of psoriasis.  6. History of left bundle-branch block.  7. Hypertension.  8. Hyperlipidemia.  9. Family history of premature coronary artery disease.   SURGICAL HISTORY:  He is status post cardiac catheterization as well as  bypass surgery, cholecystectomy, and hernia repair.   ALLERGIES:  He is allergic or intolerant to PENICILLIN and reportedly  VYTORIN.   CURRENT MEDICATIONS:  1. Albuterol inhaled q.6 h. and p.r.n.  2. Aspirin 81 mg daily.  3. Zithromax 500 mg IV at bedtime.  4. Os-Cal daily.  5. Rocephin 1 gram daily.  6. Heparin drip.  7. Lopressor 50 mg daily (prior to admission on Toprol-XL 50 mg      daily).  8. Protonix 40 mg a day.  9. Crestor 10 mg a day.  10.Senokot 1 tablet daily.  11.Mavik 2 mg daily   SOCIAL HISTORY:  He lives in [place] by himself.  He has a daughter  that lives in the area and he still drives.  He denies any history of  alcohol, tobacco, or drug abuse.  He is a retired Naval architect.   FAMILY HISTORY:  Both of his parents died of strokes but he has one  brother that died of a heart attack at age 40.   REVIEW OF SYSTEMS:  He reportedly had fevers to 102 prior to admission,  T-max in the hospital 100.5.  He has coughing and wheezing and states  the cough is productive  but cannot describe the sputum.  He also has  dyspnea on exertion but denies shortness of breath at rest.  He has  confusion as described above.  He has occasional arthralgias.  He denies  melena and full 14-point review of systems is otherwise negative.   PHYSICAL EXAMINATION:  VITAL SIGNS:  Temperature is 98.3, blood pressure  138/85, pulse 107, respiratory rate 18, O2 saturation 99% on 2 liters.  GENERAL:  He is a well-developed, well-nourished elderly white male in  no acute distress but does appear slightly anxious or agitated.  HEENT:  Essentially normal.  NECK:  There is no lymphadenopathy, thyromegaly, and he has minimal JVD.  CVA:  His heart is irregular in rate and rhythm with an S1 and S2 and a  2/6 systolic ejection murmur is noted.  Distal pulses are intact in all  four extremities.  LUNGS:  He has some rales and an expiratory wheeze is noted.  SKIN:  No rashes or lesions are noted.  ABDOMEN:  Soft and nontender with active bowel sounds.  EXTREMITIES:  There is no cyanosis, clubbing, or edema noted.  MUSCULOSKELETAL:  There is no joint deformity or effusions and no spine  or CVA tenderness.  NEURO:  He is alert and oriented to name but not today, date, or city.  He did eventually realize that he was in a hospital after some coaching.  Cranial nerves II through XII are grossly intact and there is no  unilateral weakness noted.   Chest x-ray shows left upper lobe airspace infiltrate, more diffuse when  compared to a chest x-ray performed November 23, 2008.   EKG atrial fibrillation rate 111 with a right bundle branch block, the  patient in sinus rhythm with a right bundle branch block by EKG in June  2010.   LABORATORY VALUES:  Hemoglobin 13.8, hematocrit 40, WBC 6.7, platelets  125, INR 1.2.  Sodium 134, potassium 4.7, chloride 100, CO2 of 26, BUN  41, creatinine 1.8, glucose 146, GFR 36, CK-MB 283/4.8 with an index of  1.7  and troponin I 0.11.  TSH 1.97.  Urinalysis  showing 40 ketones,  small amount of bilirubin, trace blood, 0-2 red blood cells and white  blood cells and few bacteria as of November 23, 2008.   IMPRESSION:  Aaron [name] was seen today by Dr. Charlies Constable.  He is an  77 year old male with a history of coronary artery disease who came to  the hospital because of presyncope and weakness and was found to have  pneumonia as well as atrial fibrillation with rapid ventricular  response.  Currently, the best option is rate control.  He has been  started on intravenous Cardizem by the primary care team.  We will also  increase his beta-blocker.  It would be appropriate to discontinue the  Cardizem in a.m. if rate control has been achieved by beta blockade.  It  is appropriate to anticoagulate him with heparin as has been done.  His  D-dimer was elevated and a V/Q scan is pending.  A decision about  Coumadin will be made once his mental status is further assessed.  His  confusion is very concerning for compliance with Coumadin as well as  increased fall risk.  Neuro consult may be needed to define this  further.  Additional cardiac enzymes are ordered as well as a BNP.  We  will continue to follow him closely following the hospital and follow up  on these labs.      Theodore Demark, PA-C      Bruce R. Juanda Chance, MD, [facility]  Electronically Signed    RB/MEDQ  D:  11/26/2008  T:  11/27/2008  Job:  [id number]

## 2010-08-26 NOTE — Letter (Signed)
March 26, 2008    Patrica Duel, M.D.  [address]  [place], [place] [address]   RE:  ESLI, JERNIGAN  MRN:  [id number]  /  DOB:  [birth year]   Dear Loraine Leriche,   I had the pleasure of seeing Ladanian Kelter in the office today in a  followup visit.  Cardiacwise, he is stable.  He has not been having any  chest pain.  He did have a chest x-ray recently in your office, I do  have the report of that from [facility].  There were no significant  findings.  He actually feels quite well.  His most recent lipid profile  here revealed an LDL of 71 and an HDL of 48.  This was in line with his  previous study.  His liver functions were unremarkable.   Medications include:  1. Mavik 2 mg daily.  2. Plavix 75 mg daily.  3. Aspirin 160 mg daily.  4. Crestor 10 mg daily.  5. Calcium.  6. Vitamin D 600 mg daily.  7. Metoprolol succinate 50 mg daily.   On physical examination, he is alert and oriented in no distress.  The  blood pressure is 132/78, the pulse is 60.  The lung fields are clear,  and the cardiac rhythm is regular.  There is a soft systolic ejection  murmur that would be graded at 2/6, there is no diastolic murmurs  appreciated, and there is no extremity edema.   The electrocardiogram demonstrates normal sinus rhythm with right bundle-  branch block and left posterior fascicular block.  Compared to previous  tracings, this is relatively new, but does not require any specific  treatment.   In summary, Mr. Bruna is doing well.  He does have a conduction  delay which he did not have previously, but this is not of great  concern.  We will discontinue to monitor this.  We will get a 2-D echo  in 6 months to evaluate his aortic murmur.  I will plan to see him back  in follow up in 6 months' time and drop you a note at that time.    Sincerely,      Arturo Morton. Riley Kill, MD, [facility]  Electronically Signed    TDS/MedQ  DD: 03/26/2008  DT: 03/27/2008  Job #:  [id number]

## 2010-08-26 NOTE — H&P (Signed)
NAMEJAWAN, Aaron [name]           ACCOUNT NO.:  [id number]   MEDICAL RECORD NO.:  [id number]          PATIENT TYPE:  INP   LOCATION:  5033                         FACILITY:  [facility]   PHYSICIAN:  Della Goo, M.D. DATE OF BIRTH:  1924/03/13   DATE OF ADMISSION:  11/26/2008  DATE OF DISCHARGE:                              HISTORY & PHYSICAL   PRIMARY CARE PHYSICIAN:  Patrica Duel, M.D.   CHIEF COMPLAINT:  Cough, fever, chills.   HISTORY OF PRESENT ILLNESS:  This is an 77 year old male who presents to  the emergency department at [facility] secondary to complaints  of worsening pneumonia with fever, chills, cough productive of whitish  to yellowish sputum.  The patient had been seen at the emergency  department at [facility] on August 13 and diagnosed with pneumonia and  had been treated with an IM injection of Rocephin and placed on  azithromycin to continue as an outpatient.  The patient reports  worsening of his symptoms with continued fever, chills and coughing, and  he contacted the on-call physician and was advised to go to [facility] for admission.   PAST MEDICAL HISTORY:  1. Significant for coronary artery disease.  2. Hyperlipidemia.  3. Hypertension.  4. Aortic stenosis.  5. Psoriasis.   PAST SURGICAL HISTORY:  1. History of a coronary artery bypass grafting.  2. Cholecystectomy.  3. Hernia repair.   MEDICATIONS:  1. Metoprolol 50 mg once daily.  2. Mavik 2 mg p.o. daily.  3. Plavix 75 mg 1 p.o. daily.  4. Crestor 10 mg 1 p.o. daily.  5. Aspirin 81 mg 1 p.o. daily.  6. Calcium 600 mg 1 p.o. daily.  7. Azithromycin 500 mg p.o. daily.   ALLERGIES:  PENICILLIN.   SOCIAL HISTORY:  The patient is a nonsmoker, nondrinker.  No history of  illicit drug usage.   FAMILY HISTORY:  Positive for coronary artery disease in a brother,  hypertension in his father and paternal uncles, and cerebrovascular  accidents in both parents.   REVIEW OF  SYSTEMS:  Pertinents are mentioned above.  The patient denies  having any nausea, vomiting, diarrhea.  Denies having any syncope or  seizures or lightheadedness.  He does report having fatigue and malaise  and loss of appetite.   PHYSICAL EXAMINATION FINDINGS:  This is an 77 year old well-nourished,  well-developed male in no visible discomfort or acute distress.  VITAL SIGNS:  Temperature 100.2 orally, blood pressure 124/61, heart  rate 109, respirations 20, O2 saturations 95%.  HEENT EXAMINATION:  Normocephalic, atraumatic.  Pupils equally round,  reactive to light.  Extraocular movements are intact.  Funduscopic  benign.  There is no scleral icterus.  Nares are patent bilaterally.  Oropharynx is clear.  NECK:  Supple, full range of motion.  No thyromegaly, adenopathy or  jugular venous distention.  CARDIOVASCULAR:  Regular rate and rhythm.  No murmurs, gallops or rubs.  LUNGS:  Clear to auscultation bilaterally.  ABDOMEN:  Positive bowel sounds, soft, nontender, nondistended.  EXTREMITIES:  Without cyanosis, clubbing or edema.  NEUROLOGIC EXAMINATION:  Alert and oriented x3.  No focal  deficits on  examination.   LABORATORY STUDIES:  White blood cell count 6.5, hemoglobin 14.2,  hematocrit 41.5, MCV 98.7, platelets 131, neutrophils 88% lymphocytes  6%.  Sodium 131, potassium 4.1, chloride 100, carbon dioxide 23, BUN 45,  creatinine 1.88 and glucose 125.  Chest x-ray performed on November 23, 2008, reveals a left upper lobe pneumonia.   ASSESSMENT:  An 77 year old male being admitted with:  1. Worsening left upper lobe pneumonia, community-acquired pneumonia.  2. Early sepsis.  3. Hyponatremia.  4. Mild dehydration.   PLAN:  The patient will be admitted and placed on IV antibiotic therapy  of Rocephin and azithromycin.  Nebulizer treatments have also been  ordered along with supplemental oxygen.  The patient will be placed on  gentle IV fluids for fluid resuscitation.   Antipyretics have been  ordered for fever as needed.  The patient's regular medications have  also been ordered, and the patient will be placed on DVT and GI  prophylaxis.  Further workup will ensue pending results of the patient's  clinical course.      Della Goo, M.D.  Electronically Signed     HJ/MEDQ  D:  11/26/2008  T:  11/26/2008  Job:  [id number]

## 2010-08-26 NOTE — Assessment & Plan Note (Signed)
[facility]                            CARDIOLOGY OFFICE NOTE   NAME:Aaron [name], Aaron [name]                  MRN:          [id number]  DATE:07/15/2007                            DOB:          1924/03/09    HISTORY:  Mr. Atchley is in for followup.  In general, he is doing  reasonably well.  He says at times he will get slightly dizzy.  He says  it sometimes feels like his heart is quivering at that time.  It is not  related to changing position.  He denies any ongoing chest pain.  He has  been able to tolerate his medicines without difficulty.   CURRENT MEDICATIONS:  1. Mavik 2 mg daily.  2. Metoprolol 25 mg p.o. b.i.d.  3. Plavix 75 mg daily.  4. Aspirin 160 mg daily.  5. Crestor 10 mg daily at bedtime.  6. Calcium with vitamin D.   PHYSICAL EXAMINATION:  GENERAL:  On physical he is alert and oriented  and in no distress.  VITAL SIGNS:  Blood pressure 122/76, pulse 64.  NECK:  There are not significant carotid bruits.  LUNGS:  The lung fields are clear.  HEART:  The cardiac rhythm today is regular without a significant murmur  noted.   Electrocardiogram demonstrates normal sinus rhythm and a rare premature  ventricular contraction.   Recent laboratory studies done revealed an LDL cholesterol of 63 with an  HDL of 50, triglycerides 104, cholesterol 134.  Liver functions are  normal.   IMPRESSION:  1. Coronary artery disease with complex intervention and prior drug      eluting stent at a circumflex bifurcation lesion using drug eluting      technology.  2. Hypercholesterolemia on lipid lowering therapy.  3. Palpitations and/or quivering heart with some mild intermittent      dizziness.   RECOMMENDATIONS:  1. Continue current medical regimen.  2. He and I discussed Plavix duration.  3. We will get an event monitor.    Arturo Morton. Riley Kill, MD, [facility]  Electronically Signed   TDS/MedQ  DD: 07/15/2007  DT: 07/15/2007  Job #: [id number]

## 2010-08-26 NOTE — Letter (Signed)
September 14, 2007    Aaron [name], M.D.  [address], [address]  [place], [place] [address]   RE:  Aaron [name], Aaron [name]  MRN:  [id number]  /  DOB:  Jul 02, 1923   Dear Aaron [name]:   I had the pleasure of seeing Aaron [name] in the office today in  followup.  Since I last saw him, he is doing well.  His dizziness is  virtually resolved.  He says that he stopped using caffeine, and also  cut down his chocolate and things are dramatically better.  He had an  event monitor.  I reviewed this with our electrophysiologist and there  were no significant findings to suggest a significant arrhythmia.   CURRENT MEDICATIONS:  1. Mavik 2 mg daily.  2. Metoprolol 25 mg b.i.d.  3. Plavix 75 mg daily  4. Aspirin 160 mg daily.  5. Crestor 10 mg nightly.  6. Doxycycline, which he just started.   PHYSICAL:  Today, his blood pressure is 130/80,  the pulse is 78.  LUNGS:  Fields are clear.  CARDIAC:  Rhythm is regular.   Overall, he appears to be stable.  He is doing well.  He is now out more  than a year from his percutaneous intervention.  Given the findings, I  would be likely inclined to continue on with clopidogrel.  We will see  him back in followup in 6 months' time, but I would be happy to see him  sooner if further problems arise. Thanks for allowing me to share in his  care.    Sincerely,      Aaron [name]. Riley Kill, MD, [facility]  Electronically Signed    TDS/MedQ  DD: 09/14/2007  DT: 09/14/2007  Job #: [id number]

## 2010-08-26 NOTE — Assessment & Plan Note (Signed)
[facility]                            CARDIOLOGY OFFICE NOTE   NAME:Aaron [name], Aaron [name]                  MRN:          [id number]  DATE:01/19/2007                            DOB:          02-18-1924    SUBJECTIVE:  Aaron [name] is in today for followup.  He really is doing  quite well.  He is now out about 9 months from his last percutaneous  coronary intervention.  His angina was resolved by placement of a drug-  eluting stent.  He currently is on both aspirin and Plavix and  tolerating all of this reasonably well.  His last CBC was unremarkable.   OBJECTIVE:  VITAL SIGNS:  Currently his blood pressure is 132/70, pulse  is 55.  LUNG:  Lung fields are clear.  CARDIAC:  The cardiac rhythm is regular.  There is a 2/6 systolic  ejection murmur compatible with aortic sclerosis.   Electrocardiogram today demonstrates sinus bradycardia, otherwise  unremarkable.   IMPRESSION:  1. Coronary artery disease with prior coronary artery bypass grafting      surgery and subsequent percutaneous stenting of the circumflex      artery with a drug-eluting platform, stable.  2. Hypercholesterolemia on Lipitor - lowering.  3. Memory loss complaints on the part of the patient who wants to      switch to maybe another drug.  4. Prior splenic infarct.   PLAN:  1. Continue dual antiplatelet therapy with reduction in aspirin dose      to 160 mg.  2. Return to the clinic in six months.  3. Lipid and liver profile with consideration of switching to Crestor.  4. Continue general medical followup with Dr. Patrica Duel.     Arturo Morton. Riley Kill, MD, [facility]  Electronically Signed    TDS/MedQ  DD: 01/19/2007  DT: 01/20/2007  Job #: [id number]   cc:   Patrica Duel, M.D.

## 2010-08-29 NOTE — Discharge Summary (Signed)
NAMETIMONTHY, HOVATER           ACCOUNT NO.:  [id number]   MEDICAL RECORD NO.:  [id number]          PATIENT TYPE:  OIB   LOCATION:  6525                         FACILITY:  [facility]   PHYSICIAN:  Veverly Fells. Excell Seltzer, MD  DATE OF BIRTH:  December 11, 1923   DATE OF ADMISSION:  12/17/2005  DATE OF DISCHARGE:  12/18/2005                                 DISCHARGE SUMMARY   PROCEDURES:  1. Cardiac catheterization.  2. Coronary arteriogram.  3. Left ventriculogram.  4. PTCA and bare metal stent to one vessel.   TIME AT DISCHARGE:  33 minutes.   PRIMARY DIAGNOSIS:  Unstable anginal pain.   SECONDARY DIAGNOSES:  1. Status post PTCA in 1993.  2. Single-vessel bypass in 1995 with LIMA to LAD.  3. History of aortic stenosis (mild).  4. Status post hernia repair and cholecystectomy.  5. Hypertension.  6. Family history of coronary artery disease in his brother.  7. Allergy or intolerance to penicillin and Vytorin.   HOSPITAL COURSE:  Mr. Desa is an 77 year old male with no previous  history of coronary artery disease.  He was referred by Dr. Nobie Putnam to Dr.  Excell Seltzer for exertional chest tightness, and we felt that he needed a cardiac  catheterization to further define his anatomy, and this was performed  December 17, 2005.   The cardiac catheterization showed a total LAD, and the LAD to LIMA was  patent with no obstructive disease.  The RCA system was normal.  There was a  99% proximal stenosis in the OM1 which was treated with a bare metal stent  reducing that to zero.  Mr. Placencia tolerated the procedure well, and the  sheath was removed without difficulty.   On 12/18/2005, Mr. Temme without chest pain or shortness of breath.  He  normally walks about 2 miles a day and did not feel he needed cardiac rehab,  but he was given activity restrictions.  On a recent lipid profile, his LDL  was 95, so his Lipitor was increased from 10 to 20 mg q.day.  He was in  sinus bradycardia with a  heart rate in the 50s on metoprolol 25 mg b.i.d.,  so no dose changes were made.  Mr. Antigua was evaluated by Dr. Excell Seltzer and  considered stable for discharge on 12/18/2005 with outpatient followup  arranged.   DISCHARGE INSTRUCTIONS:  His activity level is to be increased gradually.  He is to stick to a low-fat diet.  He is to call office for problems with  the cath site.  He is to follow up with Dr. Excell Seltzer on September 20th at  10:30.  He will need fasting lipid profile and liver function testing done  in 12 weeks.   DISCHARGE MEDICATIONS:  1. Mavik 2 mg q. day.  2. Lipitor 20 mg a day.  3. Coated aspirin 325 mg a day.  4. Plavix 75 mg q. day for 30 days.  5. Metoprolol 25 mg b.i.d.  6. Nitroglycerin sublingual p.r.n.  7. Calcium as prior to admission.     ______________________________  Theodore Demark, PA-C      Veverly Fells. Excell Seltzer,  MD  Electronically Signed    RB/MEDQ  D:  12/18/2005  T:  12/18/2005  Job:  [id number]   cc:   Patrica Duel, M.D.

## 2010-08-29 NOTE — Procedures (Signed)
   NAME:  Aaron [name], Aaron [name]                     ACCOUNT NO.:  [id number]   MEDICAL RECORD NO.:  [id number]                   PATIENT TYPE:  OUT   LOCATION:  RAD                                  FACILITY:  [facility]   PHYSICIAN:  Vida Roller, M.D.                DATE OF BIRTH:  03/03/1924   DATE OF PROCEDURE:  DATE OF DISCHARGE:  08/18/2002                                    STRESS TEST   INDICATIONS FOR PROCEDURE:  [name] is a 77 year old male with past  medical history significant for coronary artery disease, status post  coronary artery bypass grafting in 1996.  He denies any ischemic symptoms.  He is here for preoperative clearance for a cholecystectomy.   BASELINE DATA:  EKG showed sinus rhythm at 64 beats per minute with frequent  ventricular ectopy.  Blood pressure is 154/82.   The patient exercised for a total of 10 minutes and 19 seconds, Bruce  protocol stage 4, and 12.9 mets.  The patient denied any chest discomfort.  Cardiolite was inducted at 5 minutes and 49 seconds, and a heart rate of 123  beats per minute.  Maximum heart rate achieved was 152 beats per minute,  which is 113% of predicted maximum.  Maximum blood pressure was 230/82, and  recovered down to 162/78 in recovery.  EKG showed frequent ventricular  ectopy and minimal ST depression and T-wave inversion inferolaterally which  resolved in recovery.   Final images and results are pending.  M.D. reviewed.     Amy Mercy Riding, P.A. [facility]                     Vida Roller, M.D.    AB/MEDQ  D:  08/30/2002  T:  08/30/2002  Job:  [id number]

## 2010-08-29 NOTE — H&P (Signed)
Aaron [name], Aaron [name]           ACCOUNT NO.:  [id number]   MEDICAL RECORD NO.:  [id number]          PATIENT TYPE:  OIB   LOCATION:  6526                         FACILITY:  [facility]   PHYSICIAN:  Arturo Morton. Riley Kill, MD, FACCDATE OF BIRTH:  06-Jul-1923   DATE OF ADMISSION:  03/16/2006  DATE OF DISCHARGE:                              HISTORY & PHYSICAL   PRIMARY CARDIOLOGIST:  Dr. Tonny Bollman.   PRIMARY CARE PHYSICIAN:  Dr. Nobie Putnam in [place], [place].   CHIEF COMPLAINT:  Chest pain.   HISTORY OF PRESENT ILLNESS:  Aaron [name] is an 77 year old male with a  history of coronary artery disease.  He had percutaneous intervention to  his OM1 with a bare-metal stent on December 17, 2005.  Aaron [name]  states that about five weeks after that he began experiencing exertional  chest pain.  He was seen in the office on December 23, 2005 but was not  having symptoms at that time.  He began experiencing symptoms only with  increased exertion but his symptoms have progressed to the point that he  cannot walk a significant distance on flat ground without getting chest  tightness.  He has not tried nitroglycerin for this.  He saw his primary  care physician and was restarted on Plavix and had Imdur 30 added to his  medication regimen.  He did note an improvement in his symptoms but  because of the progressive nature of his chest pain he was referred for  cardiac catheterization.  He is here today for the procedure.  He has  had no resting chest pain.  He is currently symptom free.   PAST MEDICAL HISTORY:  1. Status post cardiac catheterization on December 17, 2005 with a 99%      OM1 treated with PTCA and bare-metal stent.  Secondary diagnosis      status post aortocoronary bypass surgery in 1995 with LIMA to LAD.  2. Aortic stenosis.  3. Hypertension.  4. Hyperlipidemia.  5. Family history of coronary artery disease.  6. Remote history of psoriasis and hives, both have  resolved.  7. Abdominal pain post cath in September 2007 with probable splenic      cyst on CT.   SURGICAL HISTORY:  He is status post cardiac catheterizations as well as  bypass surgery, hernia repair, cholecystectomy.   ALLERGIES:  PENICILLIN.   CURRENT MEDICATIONS:  1. Isosorbide 30 mg daily.  2. Mavik 2 mg daily.  3. Aspirin 325 mg daily.  4. Plavix 75 mg daily.  5. Metoprolol 50 mg 1/2 tablet b.i.d.  6. Lipitor 20 mg daily.   SOCIAL HISTORY:  He lives alone in [place], [place].  He has  two children but they are not close by.  He has not smoked or had  alcohol in 60 years.  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  He eats  a heart healthy diet.   FAMILY HISTORY:  Both his parents died of strokes.  He has one brother  who died of myocardial infarction at age 20, but no other siblings with  coronary artery disease.   REVIEW  OF SYSTEMS:  The chest pain as described above.  He denies  hematemesis, hemoptysis, melena.  He has had no fevers or chills.  He  has had no change in his weight.  A 12-point review of systems otherwise  negative.   PHYSICAL EXAM:  VITAL SIGNS:  Temperature is 96.8, blood pressure  131/64, heart rate 53, respiratory rate 20, O2 saturation 97% on room  air.  GENERAL:  He is a well-developed elderly white male in no acute  distress.  HEENT:  His head is normocephalic and atraumatic with pupils equal,  round, reactive to light accommodation.  Extraocular movements intact.  Sclera clear.  Nares without discharge.  NECK:  There is no lymphadenopathy, thyromegaly, bruit or JVD noted.  CVA:  His heart is regular in rate and rhythm with an S1-S2 and a 2 or  3/6 systolic ejection murmur is noted at the base.  It has a decrescendo  pattern.  LUNGS:  Clear to auscultation bilaterally.  ABDOMEN:  Soft and nontender with active bowel sounds.  EXTREMITIES:  There is no cyanosis, clubbing or edema noted.  No femoral  bruits are appreciated.  Distal  pulses are 2+ in all four extremities.  There is no cyanosis, clubbing or edema noted.  He has no joint  effusions or deformities and no spine or CVA tenderness.  NEURO:  He is alert and oriented.  Cranial nerves II-XII grossly intact.   LABORATORY VALUES:  Hemoglobin 14.9, hematocrit 43.3, WBC 6.8, platelets  167.  INR 1.1 and BMET pending.   IMPRESSION:  1. Chest pain:  Aaron [name] is here today for cardiac      catheterization.  Further evaluation and treatment will be will      depend on the results of the cath.  2. Aortic stenosis:  Have no record of an echocardiogram since 2005.      He is to follow-up with Dr. Excell Seltzer as an outpatient and      determination can be made on the timing of an echocardiogram at      that time.  3. Aaron [name] shoulders is otherwise stable and will be continued      on his home medications.      Theodore Demark, PA-C      Arturo Morton. Riley Kill, MD, [facility]  Electronically Signed    RB/MEDQ  D:  03/16/2006  T:  03/16/2006  Job:  [id number]   cc:   Patrica Duel, M.D.

## 2010-08-29 NOTE — Letter (Signed)
December 10, 2005     Patrica Duel, MD  [address]  [place], [place]  [address]   RE:  NICHOLS, CORTER  MRN:  [id number]  /  DOB:  Oct 10, 1923   Dear Dr. Nobie Putnam,   It was my pleasure to see the patient here in the cardiology clinic this  morning.  As you know, he is a very pleasant 77 year old man who has known  coronary artery disease who is status post coronary artery bypass surgery in  1995 presenting with new onset exertional chest tightness.   He reports a six week history of consistent chest tightness that occurs with  walking, using the vacuum cleaner or carrying heavy objects.  He states that  even with slow walking, he develops some degree of chest tightness.  He  denies dyspnea, orthopnea, PND, lightheadedness, palpitations or syncope.  His chest pressure does not radiate and it is localized to the anterior  chest area.  He has not tried sublingual nitroglycerin for his pain.   In general, he has done very well since his bypass surgery in 1995.  Prior  to the last six weeks, he really was asymptomatic and has maintained an  active lifestyle.  He regularly plays golf, works in his garden and walks a  few miles daily.   PAST MEDICAL HISTORY:  1. Coronary artery disease.  He underwent PTCA in 1993 and underwent      single vessel CABG in 1995.  I do not have the records of his bypass      surgery but, by his description, he had a LIMA graft likely to his LAD.      It was a single vessel bypass.  2. Aortic stenosis.  His last echocardiogram was performed in November of      2005 and demonstrated mild to moderate thickening and calcification of      his aortic valve and mild aortic stenosis by Doppler measurements.  3. Hernia repair in 1997.  4. Cholecystectomy.  5. Hypertension, currently treated with Mavik.   He reports no other medical problems today.   FAMILY HISTORY:  His parents are deceased of strokes.  He has a sister who  is deceased  from Alzheimer's disease and another living sister who is alive  with Alzheimer's disease.  He has a brother who died of asbestosis at age 9  and another brother who died of a myocardial infarction at age 71.   SOCIAL HISTORY:  He lives alone and maintains a very active lifestyle as  described above.  He has two children.  He is a remote smoker back in his  17s but has not smoked in approximately 60 years.  He has never used  recreational drugs and he does not drink alcohol or caffeinated beverages.  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   REVIEW OF SYSTEMS:  A complete 12 point review of systems was performed.  Pertinent positives include a heart murmur since 1990 and scarlet fever back  in the 1930s.  All other systems were reviewed and are negative except as  described above.   PHYSICAL EXAMINATION:  GENERAL:  The patient is an alert and oriented  elderly white male in no acute distress.  EYES:  Sclerae anicteric.  Conjunctivae pink.  Pupils are equally round and  reactive to light.  ENT:  Oropharynx is clear.  Moist oral mucosa.  NECK:  There is no cervical lymphadenopathy.  Carotid upstrokes  are 2+  without bruits.  Jugular venous pressure is normal.  LUNGS:  Clear to auscultation bilaterally.  CARDIOVASCULAR:  The apex is discrete and non-displaced.  The heart is  regular rate and rhythm.  There is a 2/6 harsh early systolic murmur at the  base with radiation to the base of the neck.  There are no gallops present.  There are no diastolic murmurs present.  ABDOMEN:  Soft and non-tender.  No abdominal bruits.  There is no  organomegaly present.  Bowel sounds are present.  EXTREMITIES:  There is no clubbing, cyanosis or edema.  Peripheral pulses  are 2+ and equal throughout.  Femoral pulses are 2+ without bruits.  SKIN:  Warm and dry.  NEUROLOGIC:  Motor strength is 5/5 and equal in the arms and legs.   MEDICATIONS:  1. Mavik 2 mg daily.  2. Aspirin  325 mg daily.  3. Calcium 600 mg daily.  4. Lipitor 10 mg at bedtime.   ALLERGIES:  PENICILLIN.   EKG:  EKG from the [place] office dated August 29th shows normal sinus  rhythm with non-specific T wave flattening.  There are no significant ST  segment changes and no other abnormalities noted.   LABORATORY DATA:  From March of this year shows total cholesterol of 162,  HDL of 48, LDL of 95.   ASSESSMENT:  This is an 77 year old man with prior coronary artery bypass  surgery presenting with the following problem list:  1. Chest pressure.  His symptoms are classic for angina.  Discussed      potential diagnostic and treatment options.  I do not think there is      much utility in performing a stress test as the patient's symptoms are      really classic and I am convinced that he is having anginal symptoms      from myocardial ischemia.  Regarding treatment options, I discussed      medical therapy versus pursuing an invasive strategy of performing a      cardiac catheterization.  With his active lifestyle and desire to      continue gardening and exercising, with the presence of symptoms at      even minimal levels of activity, I have recommended a cardiac      catheterization.  The patient is in agreement and we will proceed with      this next week.  In the meantime, I have asked him to start metoprolol      25 mg twice daily as anti-anginal therapy.  I have also asked him to      fill the prescription that you gave him yesterday for sublingual      nitroglycerin to take on an as needed basis.  He will have pre-cath lab      work done here in the office and I will plan on performing his cardiac      catheterization next week.  He should continue on a daily aspirin as      well.  Depending on the findings at the time of catheterization, we      will make further recommendations at that time. 2. Hypertension.  He appears to be well controlled with Mavik.  We will      add  metoprolol as well mainly for anti-anginal effect.  3. Dyslipidemia.  He had an acceptable lipid profile earlier this year and      will continue him on the Atorvastatin.   FOLLOWUP:  We will follow up with the patient in the hospital for his  catheterization and then I would like to see him back in the clinic in  approximately two to three weeks time.   Dr. Nobie Putnam, thanks again for the opportunity to evaluate this patient.  Please feel free to call me at any time with questions regarding his care.   Sincerely,      Micheline Chapman, MD   MDC/MedQ  DD:  12/10/2005 DT:  12/11/2005 Job #:  [id number]

## 2010-08-29 NOTE — Cardiovascular Report (Signed)
[name], [name]           ACCOUNT NO.:  [id number]   MEDICAL RECORD NO.:  [id number]          PATIENT TYPE:  OIB   LOCATION:  2899                         FACILITY:  [facility]   PHYSICIAN:  Veverly Fells. Excell Seltzer, MD  DATE OF BIRTH:  November 09, 1923   DATE OF PROCEDURE:  12/17/2005  DATE OF DISCHARGE:                              CARDIAC CATHETERIZATION   PROCEDURES:  Left heart catheterization, selective coronary angiogram, LIMA  angiogram and PTCA and stenting of the first obtuse marginal.   INDICATION:  Mr. Aaron [name] is an 77 year old man with classic CCS class III  angina.  This is despite medical therapy.  He has a history of prior bypass  surgery with a LIMA to LAD and has new onset of symptoms in the past 2  months.  He was therefore referred for cardiac catheterization.   COMPLICATIONS:  None.   PROCEDURAL DETAILS:  Risks and indicates of the procedure were explained to  the patient in detail and informed consent was obtained.  The right groin  was prepped, draped and anesthetized with 1% lidocaine under normal sterile  fashion.  Arterial access was gained with a 6 French sheath using a modified  Seldinger technique.  Catheters used included a 6 [place], JR4, JR5, [place],  AL2, [place], multipurpose catheter and an angle pigtail.  All catheter  exchanges were performed over a guidewire.  Following the diagnostic  procedure, a percutaneous coronary intervention was performed on the first  obtuse marginal.  An XB 3.5 guide catheter was used and a Cougar coronary  guidewire.  It was initially ballooned with a 2.0 x 12 balloon followed by a  2.5 x 18 driver bare metal stent and then post dilated with a 2.75 x 15  noncompliant balloon.  For details of the intervention, please see below.   FINDINGS:  Aortic pressure 213/109 with a mean of 155, left ventricular  pressure 217/17 with an end diastolic pressure of 29.   CORONARY ANATOMY:  The left main stem is normal.  It bifurcates  into the LAD  and left circumflex.  LAD is 100% occluded in its proximal segment.  Left  circumflex:  The left circumflex is normal throughout the proximal vessel.  It gives off a large first obtuse marginal that has a 99% ostial stenosis.  The remainder of the mid and distal circumflex are normal.  Right coronary  artery:  The right coronary artery was very difficult to engage.  As above,  there were multiple catheters used.  It was imaged non-selectively,  ultimately with a JR5 catheter.  There were no lesions seen throughout the  right coronary artery.  The right coronary artery is dominant and bifurcates  distally into a posterolateral branch and dominant PDA.   Following native coronary angiography, a JR4 catheter was used to engage the  left subclavian and then to selectively engage the LIMA.  This demonstrated  a patent LIMA to mid LAD.  The LAD appeared patent throughout its mid and  distal segments with no obstructive disease.  The proximal LAD filled  retrograde from the graft.   A left ventriculogram was  not performed due to the patient's renal  insufficiency.   ASSESSMENT:  1. High-grade obtuse marginal disease with a 99% ostial obtuse marginal      stenosis.  2. Occluded native LAD with a patent LIMA to mid LAD.  3. Normal right coronary artery.  4. Severe hypertension with elevated left ventricular filling pressures.   PLAN:  As above, with the patient's classic symptoms and high-grade focal  lesion in the first obtuse marginal, I elected to intervene on this vessel.  The patient was given 600 mg of clopidogrel and Angiomax for  anticoagulation.  Using a 6 [place] XB 3.5 guide catheter, the left coronary  artery was engaged.  The lesion was wired with a Cougar coronary guidewire.  Initially performed a balloon inflation with a 2.0 x 12 mm balloon to 8  atmospheres.  There appeared to be a localized dissection following balloon  inflation.  This was treated with stenting  with a 2.5 x 18 mm driver stent.  There was an excellent angiographic result after stenting with no further  evidence of coronary dissection.  A noncompliant balloon was used to post  dilate the vessel.  This was a 2.75 x 15 mm noncompliant balloon.  Following  post dilatation, there was an excellent angiographic result with TIMI III  flow down the OM-1 as well as the native circumflex.   Patient will remain overnight and will continue on aspirin and Plavix in  combination for one month with aspirin 81 mg for ongoing therapy.      Veverly Fells. Excell Seltzer, MD  Electronically Signed     MDC/MEDQ  D:  12/17/2005  T:  12/17/2005  Job:  [id number]   cc:   Patrica Duel, M.D.

## 2010-08-29 NOTE — Letter (Signed)
December 23, 2005     Patrica Duel, M.D.  [address], [address]  [place], [place] [address]   RE:  Aaron [name], Aaron [name]  MRN:  [id number]  /  DOB:  [birth year]   Dear Dr. Nobie Putnam,   It is my pleasure to see Aaron [name] back in Cardiology Clinic today  following his percutaneous coronary intervention.  As you know him well, he  is a very pleasant 77 year old man who you initially sent here for  evaluation of classic angina.  After reviewing his history and known  coronary artery disease, I opted to proceed with a cardiac catheterization  on September 6.  This demonstrated a patent LIMA to LAD graft, normal right  coronary artery, and a high grade focal lesion of his first obtuse marginal.  He did not have a significant pressure gradient across his aortic valve but  he does have a known history of mild aortic stenosis.   Following his diagnostic catheterization, I proceeded to intervene on his  obtuse marginal and I placed a bare metal stent in this vessel with an  excellent angiographic result.  Aaron [name] has not had any chest  discomfort since the procedure, but he has not performed any vigorous  activity since that time.   He came to the office yesterday complaining of left flank and abdominal pain  that was worse with inspiration or coughing.  He had no complaints on the  right side at the area of his catheterization access site.  A CAT scan of  the chest and abdomen was performed yesterday to evaluate this complaint and  it demonstrated an abnormality on the tip of his spleen.  There was an  approximately 3 by 2.5 cm lesion in the tip of the spleen.  The differential  diagnosis raised was a splenic infarct versus an abscess or neoplasm.  Another possibility was raised that it could  be a simple cyst that had  hemorrhaged and now become complex.  Aaron [name]' pain has subsequently  resolved.  He reported minimal symptoms this morning, but currently, he has  no  discomfort in his abdomen, whatsoever.   His current medications include:  1. Aspirin 325 mg daily.  2. Mavik 2 mg daily.  3. Calcium 600 mg daily.  4. Lipitor 10 mg at bedtime.  5. Metoprolol 25 mg twice daily.  6. Plavix 75 mg daily.   On examination, he is an alert and oriented elderly male in no acute  distress.  VITAL SIGNS:  His weight is 168 pounds, blood pressure 114/65, heart rate  64, respiratory rate 12.  HEENT:  Sclerae anicteric, conjunctivae pink, moist oral mucosa.  NECK:  Normal carotid upstrokes.  Jugular venous pressure is normal.  LUNGS:  Clear to auscultation bilaterally.  HEART:  Regular rate and rhythm without murmurs or gallops.  ABDOMEN:  The abdomen is nontender.  Specifically, there is no tenderness to  deep palpation in the left upper quadrant or the left flank area.  Bowel  sounds are present.  There is no rebound or guarding.  EXTREMITIES:  The right groin site was checked and there is mild ecchymosis  around the access site, but no hematoma or bruit.  Peripheral pulses are 2+  and equal throughout.  There is no cyanosis, clubbing, or edema of the  extremities.   ASSESSMENT:  1. Left upper abdominal pain with splenic abnormality seen on CT scan.      Differential diagnoses raised include a splenic infarction, neoplasm,  abscess, or complex cyst.  2. Coronary artery disease status post prior CABG and recent stenting of      the obtuse marginal last week.  3. Dyslipidemia.  4. Hypertension.   Aaron [name] is currently stable from a cardiovascular standpoint.  By  history, with his abdominal pain beginning just after his catheterization  and resolving within a one week time period, I suspect the most likely  etiology to his pain as well as his splenic abnormality on CT scan as a  splenic infarct secondary to catheter manipulation.  However, with the  possibility of other etiologies being raised, I would like him to be  evaluated by a general  surgeon.  I have referred Aaron [name] to Dr.  Luretha Murphy for evaluation.  I suspect he will require a follow up CT  scan of the abdomen in a few months time, but will leave this to Dr.  Ermalene Searing discretion.  I appreciate his evaluation of Aaron [name] in  advance.   Regarding his coronary artery disease, I did not make any medication changes  today.  He is tolerating dual anti-platelet therapy with aspirin and Plavix.  He will continue on Lipitor and  Metoprolol, as well.  I would like to see him back in approximately three  months time for follow up.    Sincerely,      Veverly Fells. Excell Seltzer, MD   MDC/MedQ  DD:  12/23/2005  DT:  12/23/2005  Job #:  [id number]   CC:    [place]. Daphine Deutscher, MD

## 2010-08-29 NOTE — Assessment & Plan Note (Signed)
[facility]                            CARDIOLOGY OFFICE NOTE   NAME:[name], Aaron ALBARRAN                  MRN:          [id number]  DATE:06/03/2006                            DOB:          [birth year]    [name] is in for followup. He is stable. He has not been having  any ongoing chest pain. He was recently discharged from the hospital. He  subsequently underwent stenting with a Cypher stent through his  previously placed stent. He had dilatation of the AV branch with the  Cypher going into the main branch. The patient had had prior  revascularization surgery, and has had percutaneous intervention of the  circumflex by Dr. Excell Seltzer and developed in-stent re-stenosis. I had re-  dilated the in-stent re-stenosis, but that subsequently reoccurred and  was treated then with a drug-eluting stent. He now has been symptom  free. He and I reviewed the need for at least one year of continuous  aspirin and Plavix therapy, and the likely need for continuous therapy  up to potentially beyond that period of time given the off label use of  the stent.   Today, on examination, the blood pressure is 135/66, pulse is 64.  The lung fields are clear.  There is a systolic ejection murmur that is 2/6 compatible with aortic  valve sclerosis.   Electrocardiogram demonstrates normal sinus rhythm within normal limits.   IMPRESSION:  1. Coronary artery disease, status post percutaneous stenting of the      circumflex coronary artery with prior bypass of the left anterior      descending artery.  2. Recurrent in-stent re-stenosis treated with ES.  3. Hypercholesterolemia, on lipid-lowering therapy.  4. Hypertension.   PLAN:  1. Return to clinic in 2 months.  2. Continue current medical regimen.     Arturo Morton. Riley Kill, MD, [facility]  Electronically Signed    TDS/MedQ  DD: 06/03/2006  DT: 06/03/2006  Job #: [id number]

## 2010-08-29 NOTE — Procedures (Signed)
NAMEJOS, Aaron [name]           ACCOUNT NO.:  [id number]   MEDICAL RECORD NO.:  [id number]          PATIENT TYPE:  OUT   LOCATION:  RAD                           FACILITY:  [facility]   PHYSICIAN:  [place] Bing, M.D.  DATE OF BIRTH:  Dec 02, 1923   DATE OF PROCEDURE:  02/20/2004  DATE OF DISCHARGE:                                  ECHOCARDIOGRAM   REFERRED BY:  Patrica Duel, M.D.   CLINICAL DATA:  A 77 year old gentleman with coronary disease, aortic valve  disease, prior CABG and hypertension.   M-MODE:  1.  Aorta 2.5.  2.  Left atrium 4.4.  3.  Septum 1.6.  4.  Posterior wall 1.4.  5.  LV diastole 4.0.  6.  LV systole 2.7.   FINDINGS:  1.  A technically-adequate echocardiographic study.  2.  Mild left atrial enlargement; normal right atrial size.  3.  Normal right ventricular size; borderline RVH.  4.  Normal RV systolic function.  5.  Mild-to-moderate thickening and calcification of the aortic valve; which      appears to be trileaflet.  6.  Mild impairment in leaflet separation.  7.  Mild calcification in the wall of the proximal ascending aorta.  Doppler      indicates minimal insufficiency and very mild stenosis.  8.  Normal mitral valve; mild annular calcification; trivial regurgitation.  9.  Normal tricuspid valve with no regurgitation.  10. Pulmonic valve not well seen.  11. Normal internal dimension of the left ventricle; mild-to-moderate      hypertrophy.  12. Normal regional and global LV systolic function.  13. Normal IVC.     [place]   RR/MEDQ  D:  02/21/2004  T:  02/21/2004  Job:  [id number]

## 2010-08-29 NOTE — Letter (Signed)
April 02, 2006    Patrica Duel, M.D.  [address], [address]  [place], [place] [address]   RE:  MESSIAH, AHR  MRN:  [id number]  /  DOB:  [birth year]   Dear Loraine Leriche:   I saw Aaron [name] back at the [facility] as an  outpatient on April 02, 2006.  As you know, he is a very pleasant 77-  year-old male with coronary artery disease status post coronary bypass  surgery in 1995 who I initially saw in September for classic exertional  angina.  He underwent a cardiac catheterization at that time that  demonstrated a patent LIMA to LAD graft and a high-grade focal lesion in  the first obtuse marginal branch of his left circumflex.  The area was  complicated by the fact that it involved the left circumflex and obtuse  marginal bifurcation.  I elected to treat him with a bare-metal stent in  the setting of his advanced age.  He initially did well but developed  recurrent chest pain approximately 3 weeks after the coronary  intervention.  He was seen back by Dr. Riley Kill, who performed a cardiac  catheterization on December 4 which demonstrated high-grade restenosis  in the stent, as well as a high-grade stenosis of the left circumflex  branch. At that point, he performed successful balloon angioplasty in  this region and Mr. Endsley has done well since.   Today he reports improvement of his anginal symptoms.  He actually has  not had any angina since his repeat procedure.  He had previously  developed a squeezing sensation across his anterior chest wall with  activity and this has subsequently resolved.  He is back to modest level  activity.  He had his initial assessment at cardiac rehab yesterday  where he walked for 6 minutes without any symptoms.  He denies dyspnea,  orthopnea, PND, palpitations, or other complaints at this time.   His medications include Mavik 2 mg daily, aspirin 325 mg daily, calcium  600 mg daily, metoprolol 25 mg twice daily, Plavix  75 mg daily, and  Lipitor 20 mg daily.   On exam, he is alert and oriented in no acute distress.  His weight is  172 pounds, blood pressure is 118/63, heart rate 61.   HEENT is normal.  Neck with normal carotid upstrokes.  Jugular venous  pressure is normal.  Lungs are clear to auscultation bilaterally.  Cardiovascular:  The heart is regular rate and rhythm with a 2/6 harsh  systolic murmur at the right upper sternal border.  Abdomen is soft,  nontender, no organomegaly.  Extremities:  There is no clubbing,  cyanosis, or edema.  Peripheral pulses are 2+ and equal throughout.  The  right groin has mild ecchymosis present.  There is no femoral artery  bruit.   ASSESSMENT:  Mr. Highbaugh is currently stable from a cardiovascular  standpoint with respect to his coronary artery disease.  His current  problems include the following:  1. Coronary artery disease status post coronary artery bypass surgery      and percutaneous coronary intervention.  Unfortunately, Mr.      Guillette has developed a restenosis involving both the obtuse      marginal branch and the native left circumflex.  This was      successfully treated by Dr. Riley Kill earlier this month but it is a      complex area that may be prone to problems in the future.  We will  plan on following him from a symptomatic standpoint and if he has      recurrent symptoms we will reassess at that time.  He should      continue on aspirin and Plavix in the interim.  As he has good      blood pressure control today we will not make any changes to his      metoprolol dose.  2. Aortic stenosis.  This is stable by exam and he has a mild gradient      by cardiac catheterization.  Will follow him with serial      echocardiography in the future, but his gradient at the time of his      recent catheterization was only 13 mmHg.  3. Dyslipidemia, on 20 mg of Lipitor.  I understand that you are      following his lipids at this point and he  appears stable on      atorvastatin.  4. Followup.  Will plan on seeing Mr. Racette back in 3 months for      his regular followup.  I let him know that if he is more      comfortable following up with Dr. Riley Kill,  that would be fine.      Our office will call him with a followup appointment and he will      let us know who he would like to see in the future.   Loraine Leriche, thanks again for allowing me to participate in the care of Mr.  Hagedorn.  Feel free to call at any time with questions regarding his  care.    Sincerely,      Veverly Fells. Excell Seltzer, MD  Electronically Signed    MDC/MedQ  DD: 04/02/2006  DT: 04/02/2006  Job #: [id number]

## 2010-08-29 NOTE — Discharge Summary (Signed)
Aaron [name], Aaron [name]           ACCOUNT NO.:  [id number]   MEDICAL RECORD NO.:  [id number]          PATIENT TYPE:  INP   LOCATION:  6522                         FACILITY:  [facility]   PHYSICIAN:  Arturo Morton. Riley Kill, MD, FACCDATE OF BIRTH:  [birth year]   DATE OF ADMISSION:  04/26/2006  DATE OF DISCHARGE:  04/29/2006                               DISCHARGE SUMMARY   PRIMARY CARE PHYSICIANS:  Primary cardiologist is Veverly Fells. Excell Seltzer, MD;  Primary care physician is Patrica Duel, M.D.   DISCHARGE DIAGNOSES:  Chest pain status post cardiac catheterization  this admission with the patient ruled out for myocardial infarction.  Cardiac catheterization on April 28, 2006 by Dr. Juanda Chance.  The patient  underwent successful percutaneous coronary intervention of bifurcation  lesion in the circumflex marginal and arteriovenous branch of the  circumflex artery with improvement in the marginal branch in-stent  restenosis from 95-10% using a Cypher drug-eluting stent with  improvement in the AV side branch from  90-60% with balloon angioplasty.  The patient tolerated procedure without complications.   PAST MEDICAL HISTORY:  Coronary artery disease status post single vessel  CABG in 1995 with a LIMA to the LAD, status post percutaneous  intervention with bare metal stent to the OM-3 in September, 2007,  status post cardiac catheterization in December, 2007.  This showed  diffuse in-stent restenosis as well as 95% lesion in the ostial AV  groove circumflex , otherwise, the LIMA to the LAD was widely patent.  The RCA had 70-75% lesion in the PDA but otherwise was relatively  normal.  He underwent kissing balloon angioplasty of both lesions  including in-stent restenosis in the gelled AV groove circumflex.  His  other past medical history includes arthritis pain, aortic stenosis  which is mild with a mean gradient of 13, hypertension, hyperlipidemia,  status post cholecystectomy.   ALLERGIES:  Includes  PENICILLIN.   HOSPITAL COURSE:  Aaron [name] is a very pleasant 77 year old gentleman  with known history of coronary artery disease status post multiple  interventions/bypass who continued to remain very active doing cardiac  rehab.  Prior to this admission, the patient experienced some chest  discomfort again while walking, took nitroglycerin with relief, however  the patient had recurrent chest pain and presented to [facility]  emergency room for evaluation.  He was given IV nitroglycerin which  resolved his pain. Cardiac markers were  negative.  EKG showed normal  sinus rhythm, no ST-T wave abnormalities.  The patient was taken to the  cath lab on the 16th by Dr. Juanda Chance with results as stated above,  tolerated the procedure without complications.  Dr. Riley Kill was in to  see the patient on day of discharge.  The patient is afebrile, blood  pressure stable, no significant EKG changes.  Hemoglobin 12.5, CK-MB  7.5.  Patient is being discharged home to follow up with Dr. Riley Kill in  2 weeks.  The patient agrees to call office for appointment.   DISCHARGE MEDICATIONS:  Medications at discharge include aspirin 325 mg  daily, Plavix 75, Lipitor 20, Lopressor 25 b.i.d., Mavik 2 mg daily,  nitroglycerin p.r.n.  DISCHARGE INSTRUCTIONS:  The patient has been given the post cardiac  catheterization discharge instructions.  He agrees to call our office  for any problems from the cath site and to follow up with Dr. Riley Kill  within 2 weeks.   Duration discharge encounter is 30 minutes.      Dorian Pod, ACNP      Arturo Morton. Riley Kill, MD, [facility]  Electronically Signed    MB/MEDQ  D:  04/29/2006  T:  04/29/2006  Job:  [id number]   cc:   Patrica Duel, M.D.

## 2010-08-29 NOTE — Assessment & Plan Note (Signed)
[facility]                            CARDIOLOGY OFFICE NOTE   NAME:Aaron [name], Aaron [name]                  MRN:          [id number]  DATE:08/03/2006                            DOB:          12/24/1923    Mr. Mcclenahan is in for followup.  In general he is doing extremely  well, he denies any ongoing chest pain.  He finally had a drug-eluting  stent placed.  He is on dual anti-platelet therapy.  He has not had much  in the way of trouble.  His LDL while he was in the hospital in January  was well controlled on a regimen that includes Lipitor 20 mg daily.   Currently the patient has:  1. Mavik 2 mg daily.  2. Aspirin 325 mg daily.  3. Calcium 600 mg daily.  4. Metoprolol 25 mg p.o. b.i.d.  5. Plavix 75 mg daily.  6. Lipitor 20 mg daily.   PHYSICAL:  He is an alert, oriented gentleman in no distress.  The blood  pressure is 152/68, the pulse is 60.  LUNGS:  Fields are clear.  CARDIAC:  Rhythm is regular.   IMPRESSION:  1. Coronary artery disease status post stenting of the circumflex      coronary artery and prior bypass of the left anterior descending      artery followed by recurrent in-stent restenosis treated with drug-      eluting stent platform.  2. Hypercholesterolemia.  3. Hypertension.  4. Prior splenic infarct.   PLAN:  1. Continue dual anti-platelet therapy.  2. Return to clinic in 6 months.  3. Check CBC.  4. Continue general medical follow up with Dr. Patrica Duel.     Arturo Morton. Riley Kill, MD, [facility]  Electronically Signed    TDS/MedQ  DD: 08/03/2006  DT: 08/03/2006  Job #: [id number]   cc:   Patrica Duel, M.D.

## 2010-08-29 NOTE — Op Note (Signed)
NAME:  Aaron [name], Aaron [name]                     ACCOUNT NO.:  [id number]   MEDICAL RECORD NO.:  [id number]                   PATIENT TYPE:  OIB   LOCATION:  5703                                 FACILITY:  [facility]   PHYSICIAN:  Maisie Fus B. Samuella Cota, M.D.               DATE OF BIRTH:  11-Sep-1923   DATE OF PROCEDURE:  09/22/2002  DATE OF DISCHARGE:  09/23/2002                                 OPERATIVE REPORT   CCS NUMBER:  [id number]   PREOPERATIVE DIAGNOSES:  Chronic cholecystitis with cholelithiasis.   POSTOPERATIVE DIAGNOSES:  Chronic cholecystitis with cholelithiasis.   OPERATION:  Laparoscopic cholecystectomy with operative cholangiogram.   SURGEON:  Maisie Fus B. Samuella Cota, M.D.   ASSISTANT:  Abigail Miyamoto, M.D.   ANESTHESIA:  General.   ANESTHESIA:  Germaine Pomfret, M.D. and C.R.N.A   PROCEDURE:  The patient was taken to the operating room and placed on the  table in the supine position.  After satisfactory general anesthetic with  intubation, the entire abdomen was prepped and draped as a sterile field.  Four percent Marcaine without epinephrine was injected in the infraumbilical  area, and then a small vertical incision was made through the skin and  subcutaneous midline fascia.  A finger was placed in the peritoneal cavity,  and there were no adhesions.  A pursestring suture of 0 Vicryl was placed.  The Hasson trocar was place into the abdomen, and the abdomen insufflated to  14 mmHg pressure.  A second 10-mm trocar was placed just to the right of  midline in the subxiphoid area.  Two 5-mm trocars were placed laterally.  Gallbladder was placed on traction, and the gallbladder was quite friable  and bled easily.  The tissue also tore very easily.  The patient had a  normal size cystic duct which was isolated.  The cystic duct was clipped on  the gallbladder side, and a small opening was made into the cystic duct.  A  Cook Cholangiocath was introduced through a separate stab wound  in the right  upper quadrant of the abdomen.  The catheter was placed into the cystic duct  and held in place with a Hemoclip.  Using real-time C-arm fluoroscopy,  cholangiogram was carried out which revealed good filling of the entire  biliary system with no filling defects and good spillage of contrast into  the duodenum.  After the cholangiogram had been done, the Cholangiocath was  removed, and the cystic duct was triply clipped on the remaining side and  divided.  The cystic artery was just superior to this, and it was triply  clipped on the remaining side, once on the gallbladder side, and divided.  The gallbladder was dissected from the bed, and there was another vessel  which was clipped.  The gallbladder was quite friable and was entered with  leakage of a minimal amount of bile but no stones.  After the gallbladder  had been freed  up, it was placed into an EndoCatch bag.  The gallbladder  fossa was copiously irrigated clear.  Hemostasis was obtained, but because  of the friability of the tissues, a piece of Surgicel was left in the  gallbladder bed.  The gallbladder in the EndoCatch bag was then easily  removed through the infraumbilical incision.  The fluid in the right upper  quadrant was copiously irrigated, and there was no evidence of bile or blood  leakage.  The pursestring suture at the infraumbilical incision was tied  with good closure.  The two lateral trocars were removed under direct vision  after the fluid in the right upper quadrant had been suctioned.  The abdomen  was deflated, and the second 10-mm trocar was removed. The two midline  incisions were closed with running subcuticular sutures of 4-0 Monocryl, and  the two lateral trocar sites were closed with a single simple subcuticular  inverted 4-0 Monocryl.  Benzoin and 1/2-inch Steri-Strips were applied to  all trocar sites and also to the site in the right upper quadrant where the  Cholangiocath was introduced.   Dry sterile dressings were applied. The  patient seemed to tolerate the procedure well and was taken to the PACU in  satisfactory condition.                                               Thomas B. Samuella Cota, M.D.    TBP/MEDQ  D:  09/22/2002  T:  09/23/2002  Job:  [id number]   cc:   Patrica Duel, M.D.  [address]  [place]  [place] [address]  Fax: [phone]   Jesse Sans. Wall, M.D.

## 2010-08-29 NOTE — Cardiovascular Report (Signed)
Aaron [name], Aaron [name]           ACCOUNT NO.:  [id number]   MEDICAL RECORD NO.:  [id number]          PATIENT TYPE:  INP   LOCATION:  6522                         FACILITY:  [facility]   PHYSICIAN:  Everardo Beals. Juanda Chance, MD, FACCDATE OF BIRTH:  1924-02-07   DATE OF PROCEDURE:  04/28/2006  DATE OF DISCHARGE:                            CARDIAC CATHETERIZATION   CLINICAL HISTORY:  Mr. Aaron [name] is 77 years old and has had prior  bypass surgery with a LIMA graft to the LAD.  In September, he underwent  PCI of a bifurcation lesion in the circumflex artery with a driver stent  in the marginal branch of the circumflex artery and PTCA in the AV  branch which was jailed by the driver.  He developed restenosis and in  December underwent balloon angioplasty for in-stent restenosis with  kissing balloon into the side branch.  Over the last four days, he  developed recurrent chest pain and came in with unstable angina to the  emergency room and scheduled for evaluation today.   PROCEDURE:  The procedure was performed via right femoral arterial  sheath and 6-French preformed coronary catheters.  A front wall arterial  puncture was performed and Omnipaque contrast was used.  After  completion of the diagnostic study, we made a decision to proceed with  intervention on the bifurcation lesion in the circumflex artery which  represented a second in-stent restenosis.  We elected not to take  pictures of the mammary artery since this was done recently.   The patient was given Angiomax bolus and infusion and had been  previously on Plavix.  We used a 7-French CLS-4 guiding catheter with  side holes and two Prowater wires.  We advanced one Prowater wire into  the marginal branch of the circumflex artery which is a vessel that was  stented.  We advanced a second Prowater wire through the stent into the  AV circumflex artery which was a side branch relative to the marginal  branch.  We then dilated the marginal  branch within the stent with a  2.25 x 20-mm Maverick performing two inflations up to 15 atmospheres for  30 seconds.  We then dilated the side branch with a 2.5 x 15-mm Maverick  performing two inflations up to 8 atmospheres for 30 seconds.  We then  deployed a 2.5 x 28-mm Cypher stent within the previous driver stent and  deployed this with one inflation up to 12 atmospheres for 30 seconds.  We then removed the wire from the side branch (the AV circumflex artery)  and re-routed the wire through the Cypher stent and then into the AV  side branch.  We then postdilated the Cypher stent with a 2.75 x 20-mm  Quantum Maverick performing two inflations up to 20 atmospheres for 30  seconds.  We then performed kissing balloon angioplasty using a 2.25 x  50-mm Maverick in the AV side branch and a 2.5 x 20-mm Maverick within  the main branch which was a marginal branch of the circumflex artery.  We performed inflations up to 10 atmospheres simultaneously and then  deflated the balloons and removed  the balloons.  Final diagnostic study  was then performed through the guiding catheter.   The patient tolerated the procedure well and left the lab in  satisfactory condition.  The right femoral artery was closed with  AngioSeal at the end of the procedure.  We decided given an extra 300 mg  of Plavix at the end of the procedure.   RESULTS:  The aortic pressure was 194/104 with a mean of 145.  The left  ventricular pressure is 194/27.  This came down later in the procedure  with treatment.   The left main coronary artery:  The left main coronary artery is free of  significant disease.   Left anterior descending artery.  Left anterior descending artery was  completely occluded in its origin.   The circumflex artery:  The circumflex artery gave rise to a large  marginal branch and a large AV branch which terminated in a  posterolateral branch.  These two branches were about equal.  The stent  which  extended into the marginal branch had a 95% stenosis in its mid  and distal portion.  The AV branch had a 90% ostial stenosis relative to  the stented segment.   The right coronary artery:  The right coronary artery is a moderate-  sized vessel.  It gave rise to a conus branch, a right ventricular  branch, a posterior descending branch and a posterolateral branch.  There was a long area of 70-75% narrowing in the posterior descending  branch.   The LIMA graft to LAD was patent by prior study in December.   LEFT VENTRICULOGRAM:  Left ventriculogram performed in the RAO  projection showed hypokinesis of the inferolateral wall.  The overall  wall motion was good with an estimated fraction of 60%.   Following treatment of the bifurcation lesion in the marginal and AV  branch of the circumflex artery, the in-stent restenotic lesion in the  marginal branch improved from 95% to 10% using a Cypher drug-eluting  stent and the AV side branch improved from 90% to 60% with balloon  angioplasty.   CONCLUSION:  1. Coronary artery disease status post prior bypass surgery and status      post prior percutaneous interventions as described above with total      occlusion of the left anterior descending artery, 95% stenosis      within the circumflex marginal branch and a 90% stenosis at the      ostium of the arteriovenous branch of the circumflex artery      (bifurcation lesion), a 70-75% stenosis in the posterior descending      branch of the right coronary artery and a patent left internal      mammary artery graft to the left anterior descending artery by      prior study with inferolateral wall hypokinesis with an estimated      fraction of 60%.  2. Successful percutaneous coronary intervention of the bifurcation      lesion in the circumflex marginal and arteriovenous branch of the      circumflex artery with improvement in the marginal branch in-stent     restenosis from 95% to 10% using a  Cypher drug-eluting stent and      improvement in the arteriovenous side branch from 90% to 60% with      balloon angioplasty.   DISPOSITION:  The patient returned postanesthesia for further  observation.      Bruce Elvera Lennox Juanda Chance, MD, [facility]  Electronically  Signed     BRB/MEDQ  D:  04/28/2006  T:  04/28/2006  Job:  [id number]   cc:   Arturo Morton. Riley Kill, MD, [facility]  Patrica Duel, M.D.  Cardiopulmonary Lab

## 2010-08-29 NOTE — Discharge Summary (Signed)
Aaron [name], Aaron [name]           ACCOUNT NO.:  [id number]   MEDICAL RECORD NO.:  [id number]          PATIENT TYPE:  OIB   LOCATION:  6526                         FACILITY:  [facility]   PHYSICIAN:  Arturo Morton. Riley Kill, MD, FACCDATE OF BIRTH:  22-Jan-1924   DATE OF ADMISSION:  03/16/2006  DATE OF DISCHARGE:  03/17/2006                               DISCHARGE SUMMARY   PROCEDURES:  1. Cardiac catheterization.  2. Coronary arteriogram.  3. Left ventriculogram.  4. LIMA arteriogram.  5. Complex percutaneous intervention for in-stent restenosis with      cutting balloon angioplasty to two vessels utilizing kissing      balloon angioplasty.   PRIMARY DIAGNOSIS:  Anginal chest pain.   SECONDARY DIAGNOSES:  1. Status post aortocoronary bypass surgery in 1996 with LIMA to LAD.  2. Status post percutaneous intervention to the obtuse marginal #1      with a bare-metal stent on December 17, 2005.  3. Remote history of percutaneous transluminal coronary angioplasty in      1993 (location unavailable).  4. History of aortic stenosis.  5. Hypertension.  6. Hyperlipidemia.  7. Family history of coronary artery disease in his brother.  8. Remote history of psoriasis/hives, resolved.  9. History of abdominal pain post cath in September 2007 with probable      splenic infarct/cyst on CT.  10.Status post hernia repair and cholecystectomy.  11.Allergy or intolerance to penicillin.   TIME OF DISCHARGE:  Thirty-eight minutes.   HOSPITAL COURSE:  Aaron [name] is an 77 year old male with known  coronary artery disease.  He had a bare-metal stent placed to the OM in  September 2007.  Since then, he had problems with increasing angina  which was exertional but crescendo in pattern.  Dr. Nobie Putnam evaluated  him and referred him for outpatient catheterization.  He came to the  hospital for the procedure on March 16, 2006.   The cardiac catheterization showed 95% in-stent restenosis in the OM1  stent  and he also had a 95% stenosis in the AV circumflex.  The OM1  stenosis was treated with cutting balloon angioplasty reducing the in-  stent restenosis to less than 40%.  A kissing balloon was used to also  reduce the stenosis in the AV circumflex from 95% to 60-70%.  His EF was  normal and the LIMA to LAD was patent with no critical distal disease.  There was a 70% stenosis in the PDA for which medical therapy is  recommended.   Aaron [name] was seen by cardiac rehab and educated on the stent  restrictions, cardiac risk factor reduction and exercise.   On March 17, 2006, his postprocedure labs were stable.  His hemoglobin  dropped slightly after the procedure to 12.5 with a hematocrit of 36.4  and his platelets dropped slightly to 143 with WBCs 5.4 but it was felt  this could be followed as an outpatient.  He had received dextrose  containing IV fluids and his blood sugar and was 116 post procedure.  His CK-MB was within normal limits at 73/2.9.  He was evaluated by Dr.  Riley Kill and considered  stable for discharge with outpatient follow-up  arranged.   DISCHARGE INSTRUCTIONS:  His activity level is to be increased  gradually.  He is not to drive for two days and no lifting for a week.  He is to call our office for problems with the cath site.  He is to  stick to a low-fat diet.  He is to follow up with Dr. Excell Seltzer on April 02, 2006 at 10 a.m. and with Dr. Nobie Putnam as needed.   DISCHARGE MEDICATIONS:  1. Imdur 30 mg a day, on hold.  2. Mavik 2 mg daily.  3. Aspirin 325 mg q. Day.  4. Plavix 75 mg daily.  5. Lipitor 20 mg a day.  6. Metoprolol 50 mg 1/2 tablet b.i.d.  7. Sublingual nitroglycerin p.r.n.      Theodore Demark, PA-C      Arturo Morton. Riley Kill, MD, [facility]  Electronically Signed    RB/MEDQ  D:  03/17/2006  T:  03/17/2006  Job:  [id number]   cc:   Patrica Duel, M.D.

## 2010-08-29 NOTE — Cardiovascular Report (Signed)
Aaron [name], Aaron [name]           ACCOUNT NO.:  [id number]   MEDICAL RECORD NO.:  [id number]          PATIENT TYPE:  OIB   LOCATION:  6526                         FACILITY:  [facility]   PHYSICIAN:  Aaron Morton. Riley Kill, MD, FACCDATE OF BIRTH:  1923/10/23   DATE OF PROCEDURE:  03/16/2006  DATE OF DISCHARGE:                            CARDIAC CATHETERIZATION   INDICATIONS:  Aaron [name] is a very delightful gentleman who underwent  revascularization surgery in 1995 by Dr. Andrey Campanile with an internal mammary  to the LAD.  He has done clinically well but in September of this year  developed progressive chest pain and underwent catheterization  demonstrating a high-grade stenosis in the circumflex coronary artery.  This overlaid the AV circumflex.  He underwent percutaneous stenting of  the circumflex vessel and had a marked improvement in symptoms.  A non  drug-eluting platform was placed.  He recently has developed recurrent  angina pectoris with limited exercise tolerance.  He was brought to the  laboratory today for further evaluation.   PROCEDURES:  1. Left heart catheterization.  2. Selective coronary arteriography.  3. Selective left ventriculography.  4. Internal mammary angiography.  5. Complex percutaneous intervention of the AV circumflex.   DESCRIPTION OF PROCEDURE:  The patient was brought to the  catheterization laboratory and prepped and draped in the usual fashion.  Through an anterior puncture the right femoral artery was easily  entered.  A 5-French sheath was placed.  Views of the left and right  coronary arteries were obtained.  Internal mammary angiography was  performed.  Central aortic and left ventricular pressures were measured  with a pigtail.  Ventriculography was performed in the RAO projection.  Following a pressure pullback, the pigtail catheter was removed.  We  discussed the various options with this gentleman.  We elected to go  ahead and try to reopen the  stent.  The patient is 77 years old and has  had prior bypass surgery.  Redo surgery would be potentially an option  but would require vein grafts to both the circumflex and probable distal  PDA.  Otherwise, the anatomy is intact.  Preparations were then made for  intervention.  A load of Plavix was then given.  Bivalrudin was given  according to protocol.  ACT was checked and found to be appropriate for  percutaneous intervention.  We were able to get a Prowater wire down the  AV circumflex.  A high-torque floppy wire was placed down the native  vessel.  Following this, we attempted get a 2.5 x 10 cutting balloon  cross the stented area where the restenosis was, and we could not get  the cutting balloon down.  We then predilated with 2.5 mm Maverick.  Following this, a cutting balloon was taken down and cuts done  throughout the course of the restenotic stent.  Following this, we were  subsequently able to get a 2.0 Voyager down as well as a 2.5 Maverick  into the main vessel.  The bifurcation was then kissed in order to try  to open up both branches.  We were subsequently successful in doing  this.  There was marked improvement in the appearance of the large  marginal branch.  There was modest improvement in the appearance of the  AV branch.  When we appeared to have an adequate result, all balloons  and wires were subsequently removed.  The femoral sheath was sewn into  place.  He was taken to the holding area in satisfactory clinical  condition.  His electrocardiogram was unchanged.   HEMODYNAMIC DATA.:  1. Central aortic pressure was 146/81 with a mean of 109.  2. Left ventricular pressure was 159/20.  3. There was no gradient on pullback across the aortic valve.   ANGIOGRAPHIC DATA:  1. Ventriculography was done in the RAO projection.  Overall systolic      function was vigorous.  No segmental abnormalities of contraction      were identified.  2. The left main is free of  critical disease.  3. The LAD is totally occluded.  4. The distal LAD appears to be supplied by the internal mammary,      which appears to be patent.  5. The circumflex provides a small first and second marginal branch.      The second marginal branch has about 70% narrowing but is tiny.  At      the third marginal branch, there is a stent laid across the AV      circumflex, which supplies the fourth marginal branch.  There is      diffuse in-stent restenosis at this location with 95% narrowing.      The AV circumflex is also pinched, and this has 95% narrowing at      the ostium.  Following the balloon dilatations, there is about 40%      residual narrowing in the stented site.  There is a very nice lumen      that is residual with good antegrade flow.  The ostium of the AV      circumflex was reduced from about 95% down to about 60-70% and      while narrow, it appeared markedly improved from the preprocedural      study.  6. The right coronary artery demonstrates some calcification and minor      irregularity in the proximal and midvessel.  The posterolateral      branch actually is fairly large, and there is perhaps 30-40%      narrowing going into this area distally.  The PDA itself has about      70-75% narrowing proximally with a 70% stenosis just distal to      this.  It is a moderately long lesion but would be suitable for      stenting if the patient continued to have symptoms.   CONCLUSIONS:  1. Preserved left ventricular function.  2. Continued patency of the internal mammary to the left anterior      descending artery.  3. High-grade in-stent restenosis of a bifurcation lesion with      successful repeat dilatation.  4. Segmental posterior descending artery disease.   DISPOSITION:  The patient had in-stent restenosis.  We were able to  successfully reopen the vessel.  His risk of need for target vessel revascularization is substantially increased because of the nature  of  the restenosis.  We could place a drug-eluting stent across the  previously placed-stent, but this would be at the potential cost of the  AV circumflex.  My hope is that this will remain patent and supply  collaterals.  He will need to be seen back in follow-up.      Aaron Morton. Riley Kill, MD, [facility]  Electronically Signed     TDS/MEDQ  D:  03/16/2006  T:  03/17/2006  Job:  [id number]   cc:   Patrica Duel, M.D.  [facility]

## 2010-08-29 NOTE — H&P (Signed)
NAMEANTONYO, HINDERER           ACCOUNT NO.:  [id number]   MEDICAL RECORD NO.:  [id number]          PATIENT TYPE:  INP   LOCATION:  1832                         FACILITY:  [facility]   PHYSICIAN:  Bevelyn Buckles. Bensimhon, MDDATE OF BIRTH:  Aug 23, 1923   DATE OF ADMISSION:  04/26/2006  DATE OF DISCHARGE:                              HISTORY & PHYSICAL   PRIMARY CARE PHYSICIAN:  Patrica Duel, M.D. in [place].   CARDIOLOGIST:  Veverly Fells. Excell Seltzer, M.D.   REASON FOR ADMISSION:  Unstable angina.   HISTORY OF PRESENT ILLNESS:  Mr. Lamping is an 77 year old male with a  history of coronary artery disease, status post single vessel CABG in  1995 with a LIMA to the LAD.  He was admitted in September for chest  pain and underwent percutaneous intervention with a bare metal stent to  the OM3.  This jailed the AV groove circumflex which fed a fourth  marginal.  He presented with recurrent angina in December.  Cath at that  time showed diffuse in-stent restenosis as well as 95% lesion in the  ostial AV groove circumflex.  Otherwise, the LIMA to the LAD was widely  patent.  The RCA had some 70-75% lesion in the PDA but was otherwise  relatively normal.  He underwent kissing balloon angioplasty of both  lesions including in-stent restenosis and the jailed AV groove  circumflex with good result.  He was doing well.  He has been going to  cardiac rehab and also walking on his own.  This Sunday, he was [place]  [place] the [place] when he developed exertional chest pain radiating down both  arms.  He took one nitroglycerin which relieved his pain.  This morning,  he went to cardiac rehab and once again while getting on the treadmill  had some exertional chest pain.  He was switched to the new step and was  able to complete this without problems.  An appointment was made for him  to see Dr. Riley Kill on Wednesday for followup.  However, he was told that  if he had recurrent chest pain he should come to the ER.  Tonight, while  getting into bed, he had recurrent chest pain similar to his previous  angina so he came to the emergency room.  He was given IV nitroglycerin  which resolved his pain.  He is now painfree.  First set of cardiac  markers and EKG are unremarkable.   REVIEW OF SYSTEMS:  He does have some constipation as well as some  arthritis pain.  He denies any bright red blood per rectum.  No melena.  No fevers, no chills, no cough, no heart failure. The remainder of the  review of systems is completely negative except for HPI and problem  list.   PROBLEM LIST:  1. Coronary artery disease, status post single vessel CABG in 1995      with a LIMA to the LAD.      a.     PTCA and bare metal stenting to the obtuse marginal as       described above, complicated by in-stent restenosis requiring  cutting balloon angioplasty in December of 2007.  2. Aortic stenosis, mild.  Mean gradient of 13.  3. Hypertension.  4. Hyperlipidemia.  5. Gallstones, status post cholecystectomy.   CURRENT MEDICATIONS:  1. Mavik 2 mg a day.  2. Lipitor 20.  3. Aspirin 325.  4. Lopressor 25 b.i.d.  5. Plavix 75 a day.  6. Imdur 30 a day.   ALLERGIES:  PENICILLIN.   SOCIAL HISTORY:  Lives in [place] alone.  He is a retired Ecologist.  He has two kids.  He denies tobacco, alcohol or drug use.   FAMILY HISTORY:  Both parents died of strokes.  He has one brother who  died of myocardial infarction at age 34.  No other siblings with  coronary artery disease.   PHYSICAL EXAMINATION:  He is well-appearing, lying flat in bed, in no  acute distress.  Respirations are unlabored.  Blood pressure 120/71,  heart rate is 59.  He is afebrile.  HEENT:  Sclerae anicteric.  EOMI.  There is no xanthelasma.  Mucus  membranes are moist.  NECK:  Supple.  No JVD.  Carotids are 2+ bilaterally without any audible  bruits.  There is no lymphadenopathy or thyromegaly.  CARDIAC:  He is bradycardic and regular.   There is a 2/6 systolic  ejection murmur across the LV outflow tract as well as a 2/6  holosystolic murmur at the apex.  There i no rub or gallop.  LUNGS:  Clear.  ABDOMEN:  Soft, nontender, and nondistended.  There is no  hepatosplenomegaly, no bruits, no masses.  Good bowel sounds.  EXTREMITIES:  Warm with no clubbing, cyanosis, or edema.  DP pulses are  2+ bilaterally.  There are no rashes or arthropathies.  NEUROLOGICAL:  Alert and oriented x3.  Cranial nerves II through XII are  intact.  Moves all four extremities without difficulty.   EKG shows normal sinus rhythm at a rate of 63.  No ST-T wave  abnormalities.  Labs show a white count of 4.8, hemoglobin of 13.3,  platelets of 156.  Sodium 140, potassium 3.8, chloride of 106, bicarb of  27, BUN of 16, creatinine of 1.3, glucose of 110.  Point of care  markers:  MB 2.6, troponin less than 0.05.   ASSESSMENT/PLAN:  Mr. Leonhardt' presentation is consistent with  unstable angina quite concerning for recurrent in-stent restenosis.  We  will admit him, cycle his enzymes.  Treat him with aspirin, Plavix,  nitroglycerin, heparin and Integrilin.  We will plan cardiac  catheterization tomorrow.  Continue aggressive risk factor management.      Bevelyn Buckles. Bensimhon, MD  Electronically Signed     DRB/MEDQ  D:  04/27/2006  T:  04/27/2006  Job:  [id number]   cc:   Patrica Duel, M.D.  Veverly Fells. Excell Seltzer, MD

## 2010-09-05 ENCOUNTER — Encounter: Payer: Self-pay | Admitting: Cardiology

## 2010-09-17 ENCOUNTER — Encounter: Payer: Self-pay | Admitting: Cardiology

## 2010-09-17 ENCOUNTER — Ambulatory Visit (INDEPENDENT_AMBULATORY_CARE_PROVIDER_SITE_OTHER): Payer: Medicare Other | Admitting: Cardiology

## 2010-09-17 VITALS — BP 126/66 | HR 52 | Ht 67.0 in | Wt 160.0 lb

## 2010-09-17 DIAGNOSIS — I4891 Unspecified atrial fibrillation: Secondary | ICD-10-CM

## 2010-09-17 DIAGNOSIS — I359 Nonrheumatic aortic valve disorder, unspecified: Secondary | ICD-10-CM

## 2010-09-17 DIAGNOSIS — I350 Nonrheumatic aortic (valve) stenosis: Secondary | ICD-10-CM

## 2010-09-17 DIAGNOSIS — E7800 Pure hypercholesterolemia, unspecified: Secondary | ICD-10-CM

## 2010-09-17 DIAGNOSIS — I2510 Atherosclerotic heart disease of native coronary artery without angina pectoris: Secondary | ICD-10-CM

## 2010-09-17 NOTE — Patient Instructions (Signed)
Your physician recommends that you schedule a follow-up appointment in: 6 months  Your physician has requested that you have an echocardiogram. Echocardiography is a painless test that uses sound waves to create images of your heart. It provides your doctor with information about the size and shape of your heart and how well your heart's chambers and valves are working. This procedure takes approximately one hour. There are no restrictions for this procedure. To be done @ [facility].

## 2010-09-20 NOTE — Progress Notes (Signed)
HPI:  Mr. Aaron [name] comes in for follow up.  He is doing ok.  We talked a bit about his problems, and he says under no conditions would he have any other surgery. Denies chest pain.  Denies other symptoms.  Remains on warfarin for AF.  Current Outpatient Prescriptions  Medication Sig Dispense Refill  . aspirin 81 MG EC tablet Take 81 mg by mouth daily.        Marland Kitchen diltiazem (CARDIZEM) 120 MG tablet Take 120 mg by mouth daily.        . furosemide (LASIX) 20 MG tablet Take one - half tablet by mouth daily       . metoprolol (TOPROL-XL) 50 MG 24 hr tablet Take one-half tablet by mouth daily       . omeprazole (PRILOSEC) 20 MG capsule Take 2 by mouth once daily       . rosuvastatin (CRESTOR) 10 MG tablet Take 10 mg by mouth daily.        . trandolapril (MAVIK) 2 MG tablet Take 2 mg by mouth daily.        Marland Kitchen warfarin (COUMADIN) 5 MG tablet as directed.          Allergies  Allergen Reactions  . Penicillins     Past Medical History  Diagnosis Date  . S/P PTCA (percutaneous transluminal coronary angioplasty) 1993    PIC  . Other abnormal clinical finding 2008    PIC WITH DES (CYPHER)  . CAD (coronary artery disease)     S/P CABG  . Aortic stenosis   . Hypertension   . Hyperlipidemia   . Gout   . Other abnormal clinical finding 04/28/2006    PIC -DES S/P FOR INSTENT RESTENOSIS CFX  . Other abnormal clinical finding 12/17/2005    PCI -BMS,S/P BIFURCATION CFX  . Other abnormal clinical finding 03/2006    PCI-PTCA,S/P FOR ISR OF NON DES    Past Surgical History  Procedure Date  . Coronary artery bypass graft 09/07/1996    LIMA TO LAD MINIMALLY INVASIVE OFF PUMP  PROCEDURE RIGHT INGUINAL HERNIA 04/28/1996 ([facility]).    Family History  Problem Relation Age of Onset  . Stroke Mother   . Stroke Father   . Other Brother 56    MYOCARDIAL INFARCTION,NO OTHER SIBLINGD WITH CAD    History   Social History  . Marital Status: Divorced    Spouse Name: N/A    Number of Children: N/A  . Years  of Education: N/A   Occupational History  . Not on file.   Social History Main Topics  . Smoking status: Never Smoker   . Smokeless tobacco: Not on file  . Alcohol Use: No  . Drug Use: No  . Sexually Active: Not on file   Other Topics Concern  . Not on file   Social History Narrative   Lives in [place]  alone. 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    ROS: Please see the HPI.  All other systems reviewed and negative.  PHYSICAL EXAM:  BP 126/66  Pulse 52  Ht 5\' 7"  (1.702 m)  Wt 160 lb (72.576 kg)  BMI 25.06 kg/m2  General: Well developed, well nourished, in no acute distress. Head:  Normocephalic and atraumatic. Neck: no JVD Lungs: Clear to auscultation and percussion. Heart: Normal S1 and S2.  2-3/6 SEM.  No diastolic murmurs.   Abdomen:  Normal bowel sounds; soft; non tender; no organomegaly Pulses: Pulses normal in all  4 extremities. Extremities: No clubbing or cyanosis. No edema. Neurologic: Alert and oriented x 3.  EKG:  SB, rate 52.  RBBB.  No acute changes.  ASSESSMENT AND PLAN:

## 2010-09-21 NOTE — Assessment & Plan Note (Signed)
Remains on warfarin at the present time.  Stable. Continue current regimen.  No change at present.

## 2010-09-21 NOTE — Assessment & Plan Note (Signed)
Last LDL was 78 mg/dl, less than a year ago.  Continue same.

## 2010-09-21 NOTE — Assessment & Plan Note (Signed)
Needs followup echo, but would not agree to surgery.  Might be a candidate for TAVI eventually if required.  Will recheck echo.  Patient agreeable and will be done in [place].

## 2010-09-21 NOTE — Assessment & Plan Note (Addendum)
Currently stable.  No chest pain.  Doing well.  Continue medical therapy at present.

## 2010-09-23 ENCOUNTER — Ambulatory Visit (HOSPITAL_COMMUNITY): Payer: Medicare Other

## 2010-09-30 ENCOUNTER — Ambulatory Visit (HOSPITAL_COMMUNITY)
Admission: RE | Admit: 2010-09-30 | Discharge: 2010-09-30 | Disposition: A | Discharge status: Home / Self Care | Payer: Medicare Other | Source: Ambulatory Visit | Attending: Cardiology | Admitting: Cardiology

## 2010-09-30 DIAGNOSIS — I359 Nonrheumatic aortic valve disorder, unspecified: Secondary | ICD-10-CM | POA: Insufficient documentation

## 2010-09-30 DIAGNOSIS — E785 Hyperlipidemia, unspecified: Secondary | ICD-10-CM | POA: Insufficient documentation

## 2010-09-30 DIAGNOSIS — I2510 Atherosclerotic heart disease of native coronary artery without angina pectoris: Secondary | ICD-10-CM | POA: Insufficient documentation

## 2010-09-30 DIAGNOSIS — I350 Nonrheumatic aortic (valve) stenosis: Secondary | ICD-10-CM

## 2010-11-05 ENCOUNTER — Telehealth: Payer: Self-pay | Admitting: Cardiology

## 2010-11-05 MED ORDER — TRANDOLAPRIL 2 MG PO TABS: 2.0000 mg | ORAL_TABLET | Freq: Every day | ORAL | Status: DC

## 2010-11-05 NOTE — Telephone Encounter (Signed)
This has been done.

## 2010-11-05 NOTE — Telephone Encounter (Signed)
Pt needs Mavix (generic) called into [facility] in [place].  Pt will pick up this pm.  If a problem, call pt back

## 2010-11-05 NOTE — Telephone Encounter (Signed)
pt. 

## 2010-12-28 ENCOUNTER — Other Ambulatory Visit: Payer: Self-pay | Admitting: Cardiovascular Disease

## 2011-02-16 ENCOUNTER — Other Ambulatory Visit: Payer: Self-pay | Admitting: Cardiology

## 2011-04-03 ENCOUNTER — Encounter: Payer: Self-pay | Admitting: Cardiology

## 2011-04-03 ENCOUNTER — Ambulatory Visit (INDEPENDENT_AMBULATORY_CARE_PROVIDER_SITE_OTHER): Payer: Medicare Other | Admitting: Cardiology

## 2011-04-03 DIAGNOSIS — I359 Nonrheumatic aortic valve disorder, unspecified: Secondary | ICD-10-CM

## 2011-04-03 DIAGNOSIS — I4891 Unspecified atrial fibrillation: Secondary | ICD-10-CM

## 2011-04-03 DIAGNOSIS — I2510 Atherosclerotic heart disease of native coronary artery without angina pectoris: Secondary | ICD-10-CM

## 2011-04-03 DIAGNOSIS — E7800 Pure hypercholesterolemia, unspecified: Secondary | ICD-10-CM

## 2011-04-03 NOTE — Assessment & Plan Note (Signed)
No chest pain.  Has stent, therefore plan to continue ASA, low dose, with warfarin.

## 2011-04-03 NOTE — Assessment & Plan Note (Signed)
moderately severe by echo on last.  He says he would not agree to surgery, but it would be helpful to follow if his condition deteriorates.  He is asymptomatic, even in AF.

## 2011-04-03 NOTE — Patient Instructions (Signed)
Your physician has requested that you have an echocardiogram in 6 MONTHS. Echocardiography is a painless test that uses sound waves to create images of your heart. It provides your doctor with information about the size and shape of your heart and how well your heart's chambers and valves are working. This procedure takes approximately one hour. There are no restrictions for this procedure.  Your physician wants you to follow-up in: 6 MONTHS. You will receive a reminder letter in the mail two months in advance. If you don't receive a letter, please call our office to schedule the follow-up appointment.  Your physician recommends that you continue on your current medications as directed. Please refer to the Current Medication list given to you today.  Please contact Dr Lamar Blinks office about having labs drawn to follow-up on cholesterol, potassium and kidney function (BMP and LIPID)

## 2011-04-03 NOTE — Assessment & Plan Note (Addendum)
Asymptomatic.  Feels great.  On anticoagulation, so would not change treatment.  He gets his protime done at [facility].  Would suggest he have some other labs done. No change in treatment.

## 2011-04-03 NOTE — Progress Notes (Signed)
HPI:  Aaron [name] is back in for followup. He walks every day. He has not been having much in the way of problems. He denies chest pain, syncope, or shortness of breath with exertion. He underwent echocardiography back in the summer time, and at that time the aortic valve area was just over 1 cm. He does note that, under almost any circumstance, he would be unwilling to have aortic valve replacement. There is no progression in symptoms. He has not had recent labs.  Current Outpatient Prescriptions  Medication Sig Dispense Refill  . aspirin 81 MG EC tablet Take 81 mg by mouth daily.        . CRESTOR 10 MG tablet TAKE ONE TABLET BY MOUTH EVERY DAY  30 each  6  . diltiazem (CARDIZEM CD) 120 MG 24 hr capsule TAKE ONE CAPSULE BY MOUTH EVERY DAY  30 capsule  6  . furosemide (LASIX) 20 MG tablet Take one - half tablet by mouth daily       . metoprolol (TOPROL-XL) 50 MG 24 hr tablet Take one-half tablet by mouth daily       . omeprazole (PRILOSEC) 20 MG capsule Take 20 mg by mouth 2 (two) times daily. Take 2 by mouth once daily      . trandolapril (MAVIK) 2 MG tablet Take 1 tablet (2 mg total) by mouth daily.  30 tablet  6  . warfarin (COUMADIN) 5 MG tablet as directed.          Allergies  Allergen Reactions  . Penicillins     Past Medical History  Diagnosis Date  . S/P PTCA (percutaneous transluminal coronary angioplasty) 1993    PIC  . Other abnormal clinical finding 2008    PIC WITH DES (CYPHER)  . CAD (coronary artery disease)     S/P CABG  . Aortic stenosis   . Hypertension   . Hyperlipidemia   . Gout   . Other abnormal clinical finding 04/28/2006    PIC -DES S/P FOR INSTENT RESTENOSIS CFX  . Other abnormal clinical finding 12/17/2005    PCI -BMS,S/P BIFURCATION CFX  . Other abnormal clinical finding 03/2006    PCI-PTCA,S/P FOR ISR OF NON DES    Past Surgical History  Procedure Date  . Coronary artery bypass graft 09/07/1996    LIMA TO LAD MINIMALLY INVASIVE OFF PUMP   PROCEDURE RIGHT INGUINAL HERNIA Feb 12, 201998 ([facility]).    Family History  Problem Relation Age of Onset  . Stroke Mother   . Stroke Father   . Other Brother 82    MYOCARDIAL INFARCTION,NO OTHER SIBLINGD WITH CAD    History   Social History  . Marital Status: Divorced    Spouse Name: N/A    Number of Children: N/A  . Years of Education: N/A   Occupational History  . Not on file.   Social History Main Topics  . Smoking status: Never Smoker   . Smokeless tobacco: Not on file  . Alcohol Use: No  . Drug Use: No  . Sexually Active: Not on file   Other Topics Concern  . Not on file   Social History Narrative   L[place]  alone. 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    ROS: Please see the HPI.  All other systems reviewed and negative.  PHYSICAL EXAM:  BP 147/84  Pulse 58  Ht 5\' 7"  (1.702 m)  Wt 70.489 kg (155 lb 6.4 oz)  BMI 24.34 kg/m2  General: Well developed, well nourished, in no acute distress. Head:  Normocephalic and atraumatic. Neck: no JVD.  No delay in carotid pulsation.   Lungs: Clear to auscultation and percussion. Heart: irregularly irregular rhythm, with SEM about 2/6.  Not as prominent as expected.  .  Abdomen:  Normal bowel sounds; soft; non tender; no organomegaly Pulses: Pulses normal in all 4 extremities. Extremities: No clubbing or cyanosis. No edema. Neurologic: Alert and oriented x 3.  EKG:  Atrial fib.  Controlled ventricular response.   RBBB with LAFB.  On acute changes. Recurrence of af is new from last tracing.  ECHO:  09/2010 Study Conclusions    - Left ventricle: The cavity size was normal. There was mild to     moderate concentric hypertrophy. Systolic function was vigorous.     The estimated ejection fraction was in the range of 65% to 70%.   - Aortic valve: Mildly calcified annulus. Mildly to moderately     thickened,and calcified leaflets. Cusp separation was mildly     reduced. There was mild stenosis.  Trivial regurgitation. Valve     area: 1.02cm^2(VTI). Valve area: 1.07cm^2 (Vmax).   - Aorta: The aorta was moderately calcified.   - Mitral valve: Calcified annulus. Valve area by continuity equation     (using LVOT flow): 2.1cm^2.   - Left atrium: The atrium was mildly to moderately dilated.   - Right ventricle: The cavity size was normal. Wall thickness was     mildly to moderately increased.   - Right atrium: The atrium was mildly dilated.   - Pulmonary arteries: PA peak pressure: 36mm Hg (S).   Impressions:    - Compared to the prior study performed 09/16/09, there has been no     significant interval change.   ASSESSMENT AND PLAN:

## 2011-04-03 NOTE — Assessment & Plan Note (Signed)
Needs lipid and liver.  

## 2011-04-15 IMAGING — CR DG CHEST 2V
2 series · 2 of 2 positions shown · non-contrast
Comparison: 02/06/2008

CLINICAL DATA: Fever and weakness.

CHEST - 2 VIEW

[view not recorded (1 of 2)]
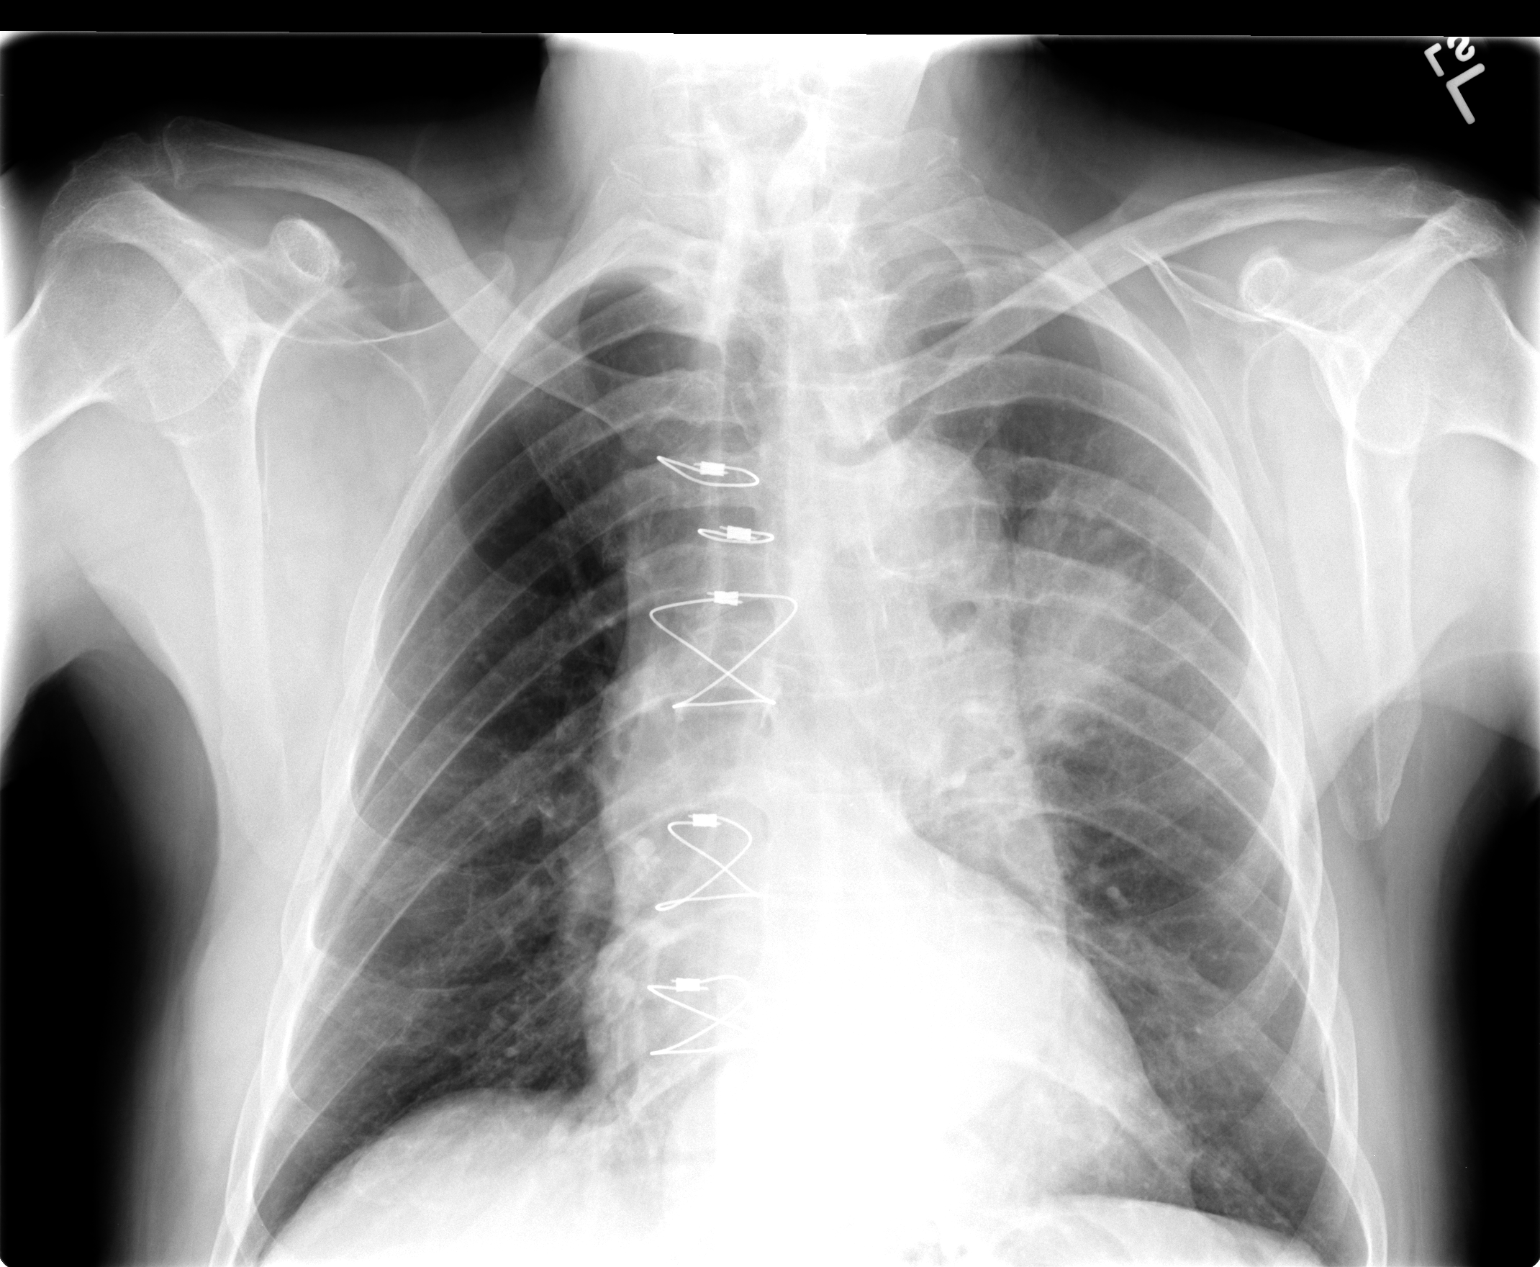

[view not recorded (2 of 2)]
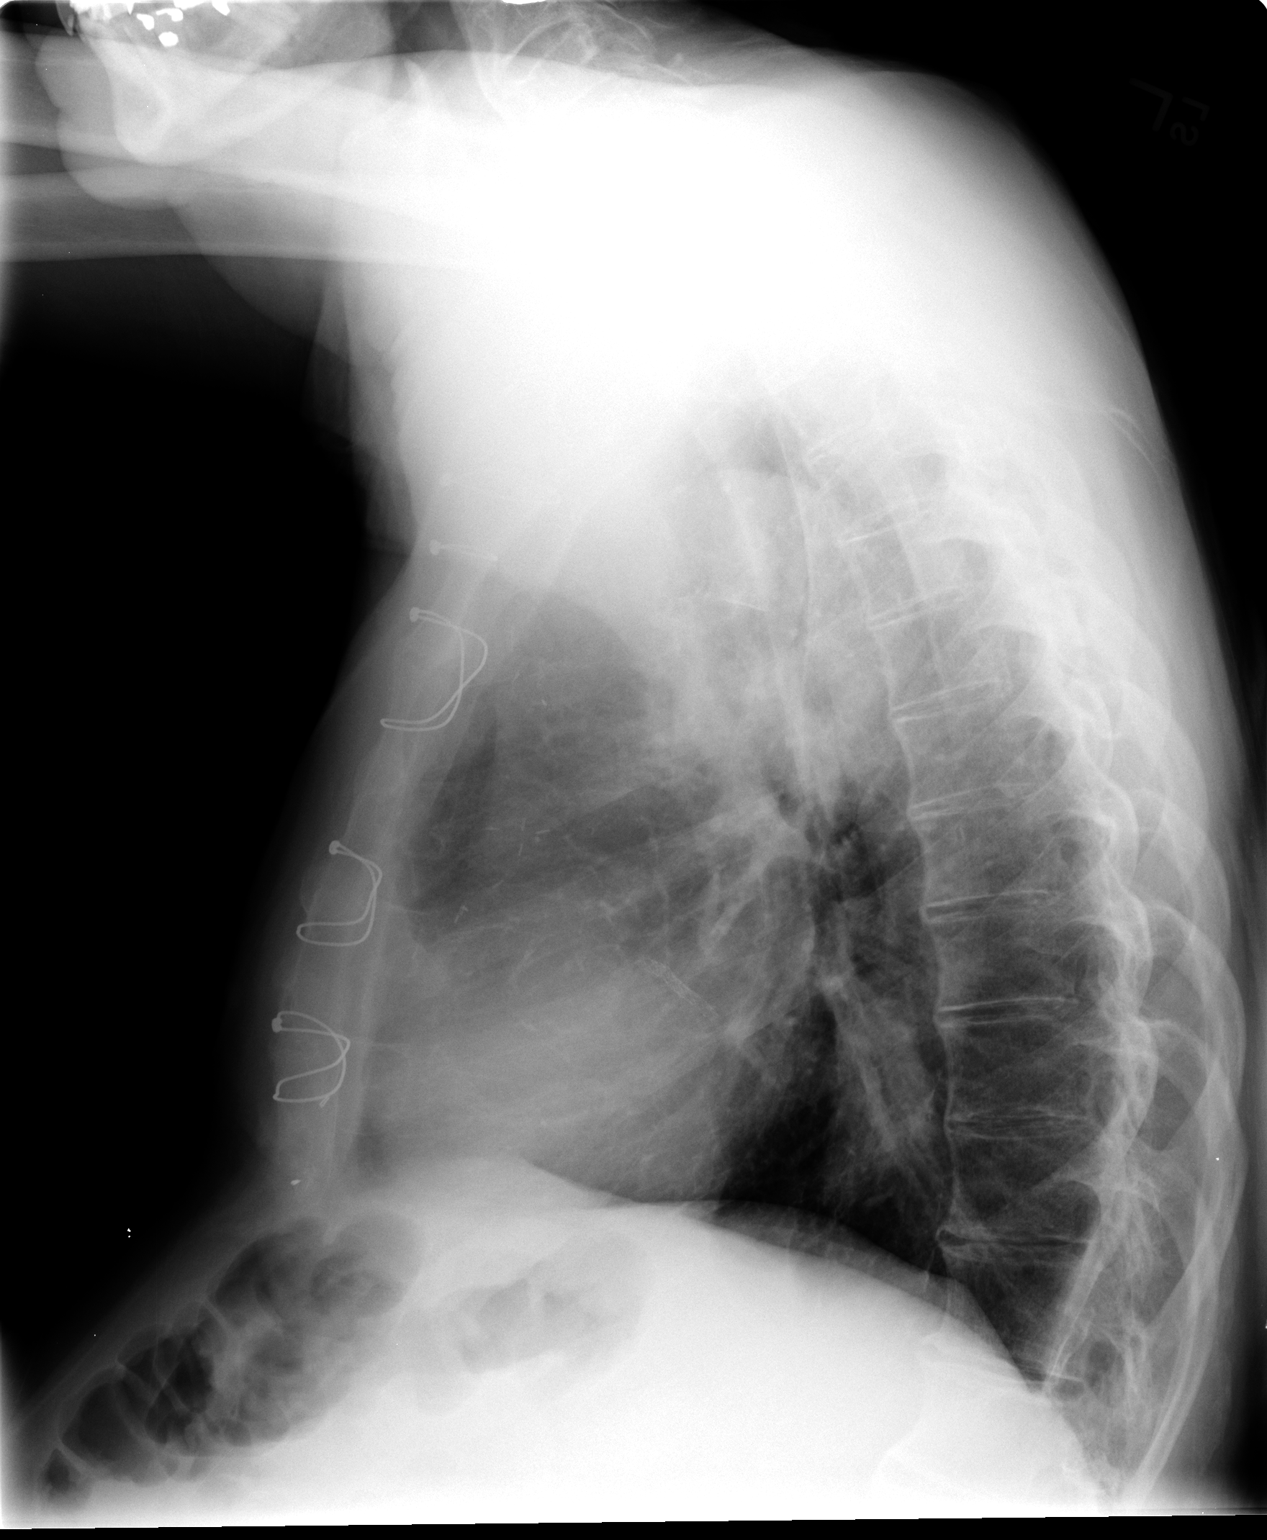

[2 of 2 positions shown; findings below may reference images not displayed]

FINDINGS: The cardiac silhouette, mediastinal and hilar contours
are within normal limits and stable.  There is tortuosity, ectasia
and calcification of the thoracic aorta.  There is left upper lobe
airspace opacity consistent with pneumonia.  No pleural effusions.
The bony thorax is intact.
IMPRESSION: Left upper lobe pneumonia.

## 2011-04-29 ENCOUNTER — Encounter: Payer: Self-pay | Admitting: Cardiology

## 2011-06-02 ENCOUNTER — Other Ambulatory Visit: Payer: Self-pay | Admitting: Cardiology

## 2011-09-12 ENCOUNTER — Other Ambulatory Visit: Payer: Self-pay | Admitting: Cardiovascular Disease

## 2011-09-15 ENCOUNTER — Other Ambulatory Visit: Payer: Self-pay | Admitting: Cardiology

## 2011-12-29 ENCOUNTER — Other Ambulatory Visit: Payer: Self-pay | Admitting: Cardiology

## 2012-01-14 ENCOUNTER — Other Ambulatory Visit: Payer: Self-pay | Admitting: Cardiology

## 2012-01-26 ENCOUNTER — Other Ambulatory Visit: Payer: Self-pay | Admitting: Cardiology

## 2012-02-23 ENCOUNTER — Ambulatory Visit (INDEPENDENT_AMBULATORY_CARE_PROVIDER_SITE_OTHER): Payer: Medicare Other | Admitting: Cardiology

## 2012-02-23 ENCOUNTER — Encounter: Payer: Self-pay | Admitting: Cardiology

## 2012-02-23 VITALS — BP 136/83 | HR 86 | Ht 67.0 in | Wt 151.0 lb

## 2012-02-23 DIAGNOSIS — I4891 Unspecified atrial fibrillation: Secondary | ICD-10-CM

## 2012-02-23 DIAGNOSIS — E7800 Pure hypercholesterolemia, unspecified: Secondary | ICD-10-CM

## 2012-02-23 DIAGNOSIS — I359 Nonrheumatic aortic valve disorder, unspecified: Secondary | ICD-10-CM

## 2012-02-23 DIAGNOSIS — I2510 Atherosclerotic heart disease of native coronary artery without angina pectoris: Secondary | ICD-10-CM

## 2012-02-23 MED ORDER — ROSUVASTATIN CALCIUM 10 MG PO TABS: 10.0000 mg | ORAL_TABLET | Freq: Every day | ORAL | Status: DC

## 2012-02-23 MED ORDER — FUROSEMIDE 20 MG PO TABS: 10.0000 mg | ORAL_TABLET | Freq: Every day | ORAL | Status: DC

## 2012-02-23 MED ORDER — DILTIAZEM HCL ER COATED BEADS 120 MG PO CP24: 120.0000 mg | ORAL_CAPSULE | Freq: Every day | ORAL | Status: DC

## 2012-02-23 MED ORDER — TRANDOLAPRIL 2 MG PO TABS: 2.0000 mg | ORAL_TABLET | Freq: Every day | ORAL | Status: DC

## 2012-02-23 MED ORDER — METOPROLOL SUCCINATE ER 50 MG PO TB24: ORAL_TABLET | ORAL | Status: DC

## 2012-02-23 NOTE — Patient Instructions (Addendum)
Your physician wants you to follow-up in: March 2014 with Dr. Riley Kill. You will receive a reminder letter in the mail two months in advance. If you don't receive a letter, please call our office to schedule the follow-up appointment.

## 2012-02-23 NOTE — Assessment & Plan Note (Signed)
He says he is feeling great and has had no symptoms at the present time

## 2012-02-23 NOTE — Assessment & Plan Note (Signed)
A separate clearly that is known to have anything else done in the future. I will see him in March and we will rediscuss this again to make sure that this is his firm stance. Will not repeat an echocardiogram at this point

## 2012-02-23 NOTE — Progress Notes (Signed)
HPI:  The patient returns today in a followup visit. His medicines were not renewed as he had not been back in followup. His rate is yesterday up today with his atrial fibrillation, and the medicine that he has not been taking his been specifically diltiazem CD. He denies any chest pain, and says he is able to do absolutely anything he really wants to do. He is also stated pretty emphatically that he does not want to have anything done about his aortic valve. He has aortic stenosis which is moderately severe as measured a year ago. He's not been having any angina.  Current Outpatient Prescriptions  Medication Sig Dispense Refill  . aspirin 81 MG EC tablet Take 81 mg by mouth daily.        Marland Kitchen diltiazem (CARDIZEM CD) 120 MG 24 hr capsule Take 1 capsule (120 mg total) by mouth daily.  90 capsule  3  . furosemide (LASIX) 20 MG tablet Take 0.5 tablets (10 mg total) by mouth daily. Take one - half tablet by mouth daily  30 tablet  11  . metoprolol succinate (TOPROL-XL) 50 MG 24 hr tablet Take one-half tablet by mouth daily  90 tablet  3  . omeprazole (PRILOSEC) 20 MG capsule Take 20 mg by mouth 2 (two) times daily. Take 2 by mouth once daily      . rosuvastatin (CRESTOR) 10 MG tablet Take 1 tablet (10 mg total) by mouth daily.  90 tablet  3  . trandolapril (MAVIK) 2 MG tablet Take 1 tablet (2 mg total) by mouth daily.  90 tablet  3  . warfarin (COUMADIN) 5 MG tablet as directed.        . [DISCONTINUED] CRESTOR 10 MG tablet TAKE ONE TABLET BY MOUTH EVERY DAY  30 each  6  . [DISCONTINUED] diltiazem (CARDIZEM CD) 120 MG 24 hr capsule TAKE ONE CAPSULE BY MOUTH EVERY DAY  30 capsule  0  . [DISCONTINUED] furosemide (LASIX) 20 MG tablet Take one - half tablet by mouth daily       . [DISCONTINUED] metoprolol (TOPROL-XL) 50 MG 24 hr tablet Take one-half tablet by mouth daily       . [DISCONTINUED] trandolapril (MAVIK) 2 MG tablet TAKE ONE TABLET BY MOUTH EVERY DAY  30 tablet  0    Allergies  Allergen  Reactions  . Penicillins     Past Medical History  Diagnosis Date  . S/P PTCA (percutaneous transluminal coronary angioplasty) 1993    PIC  . Other abnormal clinical finding 2008    PIC WITH DES (CYPHER)  . CAD (coronary artery disease)     S/P CABG  . Aortic stenosis   . Hypertension   . Hyperlipidemia   . Gout   . Other abnormal clinical finding 04/28/2006    PIC -DES S/P FOR INSTENT RESTENOSIS CFX  . Other abnormal clinical finding 12/17/2005    PCI -BMS,S/P BIFURCATION CFX  . Other abnormal clinical finding 03/2006    PCI-PTCA,S/P FOR ISR OF NON DES    Past Surgical History  Procedure Date  . Coronary artery bypass graft 09/07/1996    LIMA TO LAD MINIMALLY INVASIVE OFF PUMP  PROCEDURE RIGHT INGUINAL HERNIA 30-Aug-201998 ([facility]).    Family History  Problem Relation Age of Onset  . Stroke Mother   . Stroke Father   . Other Brother 35    MYOCARDIAL INFARCTION,NO OTHER SIBLINGD WITH CAD    History   Social History  . Marital Status: Divorced  Spouse Name: N/A    Number of Children: N/A  . Years of Education: N/A   Occupational History  . Not on file.   Social History Main Topics  . Smoking status: Never Smoker   . Smokeless tobacco: Not on file  . Alcohol Use: No  . Drug Use: No  . Sexually Active: Not on file   Other Topics Concern  . Not on file   Social History Narrative   Lives in [place]  alone. 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    ROS: Please see the HPI.  All other systems reviewed and negative.  PHYSICAL EXAM:  BP 136/83  Pulse 86  Ht 5\' 7"  (1.702 m)  Wt 151 lb (68.493 kg)  BMI 23.65 kg/m2  General: Well developed, well nourished, in no acute distress. Head:  Normocephalic and atraumatic. Neck: no JVD Lungs: Clear to auscultation and percussion. Heart: irregularly irregular SEM 1-2/6.  No DM.  PMI non displaced.    Abdomen:  Normal bowel sounds; soft; non tender; no organomegaly Pulses: Pulses normal in all 4  extremities. Extremities: No clubbing or cyanosis. No edema. Neurologic: Alert and oriented x 3.  EKG:  Atrial fib.  Poor rate control.  RBBB.  LAFB.    ASSESSMENT AND PLAN:

## 2012-02-23 NOTE — Assessment & Plan Note (Signed)
We will resume his Cardizem CD as his rate is been nicely controlled on this in combination with the beta blocker.

## 2012-02-23 NOTE — Assessment & Plan Note (Signed)
He did not have lab work done today so we will repeat his lipid liver when seen in March.

## 2012-10-10 ENCOUNTER — Other Ambulatory Visit (HOSPITAL_COMMUNITY): Payer: Self-pay | Admitting: Family Medicine

## 2012-10-10 ENCOUNTER — Ambulatory Visit (HOSPITAL_COMMUNITY)
Admission: RE | Admit: 2012-10-10 | Discharge: 2012-10-10 | Disposition: A | Discharge status: Home / Self Care | Payer: Medicare Other | Source: Ambulatory Visit | Attending: Family Medicine | Admitting: Family Medicine

## 2012-10-10 DIAGNOSIS — M545 Low back pain, unspecified: Secondary | ICD-10-CM | POA: Insufficient documentation

## 2012-10-10 DIAGNOSIS — IMO0002 Reserved for concepts with insufficient information to code with codable children: Secondary | ICD-10-CM | POA: Insufficient documentation

## 2012-10-10 DIAGNOSIS — S335XXA Sprain of ligaments of lumbar spine, initial encounter: Secondary | ICD-10-CM

## 2012-10-10 DIAGNOSIS — X58XXXA Exposure to other specified factors, initial encounter: Secondary | ICD-10-CM | POA: Insufficient documentation

## 2012-11-12 ENCOUNTER — Encounter (HOSPITAL_COMMUNITY)
Admission: EM | Disposition: A | Discharge status: Skilled Nursing Facility | Payer: Self-pay | Source: Home / Self Care | Attending: Cardiology

## 2012-11-12 ENCOUNTER — Emergency Department (HOSPITAL_COMMUNITY): Payer: Medicare Other

## 2012-11-12 ENCOUNTER — Other Ambulatory Visit: Payer: Self-pay

## 2012-11-12 ENCOUNTER — Ambulatory Visit (HOSPITAL_COMMUNITY): Admit: 2012-11-12 | Payer: Self-pay | Admitting: Cardiovascular Disease

## 2012-11-12 ENCOUNTER — Inpatient Hospital Stay (HOSPITAL_COMMUNITY)
Admission: EM | Admit: 2012-11-12 | Discharge: 2012-11-21 | DRG: 281 | Disposition: A | Discharge status: Skilled Nursing Facility | Payer: Medicare Other | Attending: Cardiology | Admitting: Cardiology

## 2012-11-12 ENCOUNTER — Encounter (HOSPITAL_COMMUNITY): Payer: Self-pay | Admitting: Emergency Medicine

## 2012-11-12 DIAGNOSIS — I472 Ventricular tachycardia, unspecified: Secondary | ICD-10-CM | POA: Diagnosis not present

## 2012-11-12 DIAGNOSIS — I4821 Permanent atrial fibrillation: Secondary | ICD-10-CM

## 2012-11-12 DIAGNOSIS — E7800 Pure hypercholesterolemia, unspecified: Secondary | ICD-10-CM | POA: Diagnosis present

## 2012-11-12 DIAGNOSIS — Z823 Family history of stroke: Secondary | ICD-10-CM

## 2012-11-12 DIAGNOSIS — Z91199 Patient's noncompliance with other medical treatment and regimen due to unspecified reason: Secondary | ICD-10-CM

## 2012-11-12 DIAGNOSIS — I2789 Other specified pulmonary heart diseases: Secondary | ICD-10-CM | POA: Diagnosis present

## 2012-11-12 DIAGNOSIS — Z951 Presence of aortocoronary bypass graft: Secondary | ICD-10-CM

## 2012-11-12 DIAGNOSIS — I252 Old myocardial infarction: Secondary | ICD-10-CM

## 2012-11-12 DIAGNOSIS — I2510 Atherosclerotic heart disease of native coronary artery without angina pectoris: Secondary | ICD-10-CM | POA: Diagnosis present

## 2012-11-12 DIAGNOSIS — Z7982 Long term (current) use of aspirin: Secondary | ICD-10-CM

## 2012-11-12 DIAGNOSIS — Z9119 Patient's noncompliance with other medical treatment and regimen: Secondary | ICD-10-CM

## 2012-11-12 DIAGNOSIS — Z9861 Coronary angioplasty status: Secondary | ICD-10-CM

## 2012-11-12 DIAGNOSIS — E785 Hyperlipidemia, unspecified: Secondary | ICD-10-CM | POA: Diagnosis present

## 2012-11-12 DIAGNOSIS — Z66 Do not resuscitate: Secondary | ICD-10-CM | POA: Diagnosis present

## 2012-11-12 DIAGNOSIS — R079 Chest pain, unspecified: Secondary | ICD-10-CM

## 2012-11-12 DIAGNOSIS — I4891 Unspecified atrial fibrillation: Secondary | ICD-10-CM | POA: Diagnosis present

## 2012-11-12 DIAGNOSIS — I4729 Other ventricular tachycardia: Secondary | ICD-10-CM

## 2012-11-12 DIAGNOSIS — Z79899 Other long term (current) drug therapy: Secondary | ICD-10-CM

## 2012-11-12 DIAGNOSIS — M109 Gout, unspecified: Secondary | ICD-10-CM | POA: Diagnosis present

## 2012-11-12 DIAGNOSIS — R413 Other amnesia: Secondary | ICD-10-CM | POA: Diagnosis present

## 2012-11-12 DIAGNOSIS — I10 Essential (primary) hypertension: Secondary | ICD-10-CM | POA: Diagnosis present

## 2012-11-12 DIAGNOSIS — Z7901 Long term (current) use of anticoagulants: Secondary | ICD-10-CM

## 2012-11-12 DIAGNOSIS — I213 ST elevation (STEMI) myocardial infarction of unspecified site: Secondary | ICD-10-CM

## 2012-11-12 DIAGNOSIS — D72829 Elevated white blood cell count, unspecified: Secondary | ICD-10-CM

## 2012-11-12 DIAGNOSIS — I4510 Unspecified right bundle-branch block: Secondary | ICD-10-CM | POA: Diagnosis present

## 2012-11-12 DIAGNOSIS — I359 Nonrheumatic aortic valve disorder, unspecified: Secondary | ICD-10-CM | POA: Diagnosis present

## 2012-11-12 DIAGNOSIS — E782 Mixed hyperlipidemia: Secondary | ICD-10-CM | POA: Diagnosis present

## 2012-11-12 DIAGNOSIS — Z880 Allergy status to penicillin: Secondary | ICD-10-CM

## 2012-11-12 DIAGNOSIS — I350 Nonrheumatic aortic (valve) stenosis: Secondary | ICD-10-CM | POA: Diagnosis present

## 2012-11-12 DIAGNOSIS — I2119 ST elevation (STEMI) myocardial infarction involving other coronary artery of inferior wall: Principal | ICD-10-CM

## 2012-11-12 HISTORY — PX: CARDIAC CATHETERIZATION: SHX172

## 2012-11-12 HISTORY — DX: Permanent atrial fibrillation: I48.21

## 2012-11-12 HISTORY — PX: AORTOGRAM: SHX6300

## 2012-11-12 LAB — TROPONIN I
Troponin I: <0.3 ng/mL (ref <0.30)
Troponin I: 4.66 ng/mL (ref <0.30)

## 2012-11-12 LAB — CBC WITH DIFFERENTIAL/PLATELET
Basophils Absolute: 0 10*3/uL (ref 0.0–0.1)
Basophils Relative: 0 % (ref 0–1)
MCHC: 34.3 g/dL (ref 30.0–36.0)
Neutro Abs: 8.7 10*3/uL — ABNORMAL HIGH (ref 1.7–7.7)
Neutrophils Relative %: 76 % (ref 43–77)
RDW: 14.2 % (ref 11.5–15.5)

## 2012-11-12 LAB — COMPREHENSIVE METABOLIC PANEL
ALT: 17 U/L (ref 0–53)
Albumin: 3.5 g/dL (ref 3.5–5.2)
Alkaline Phosphatase: 90 U/L (ref 39–117)
Potassium: 3.7 mEq/L (ref 3.5–5.1)
Sodium: 140 mEq/L (ref 135–145)
Total Protein: 6.4 g/dL (ref 6.0–8.3)

## 2012-11-12 LAB — PROTIME-INR: Prothrombin Time: 15.5 seconds — ABNORMAL HIGH (ref 11.6–15.2)

## 2012-11-12 LAB — MRSA PCR SCREENING: MRSA by PCR: NEGATIVE

## 2012-11-12 LAB — APTT: aPTT: 28 seconds (ref 24–37)

## 2012-11-12 SURGERY — CORONARY ANGIOGRAM

## 2012-11-12 MED ORDER — SODIUM CHLORIDE 0.9 % IV SOLN: INTRAVENOUS | Status: AC

## 2012-11-12 MED ORDER — HEPARIN (PORCINE) IN NACL 100-0.45 UNIT/ML-% IJ SOLN
INTRAMUSCULAR | Status: AC
  Administered 2012-11-12: 12 [IU]/kg/h via INTRAVENOUS
  Filled 2012-11-12: qty 250

## 2012-11-12 MED ORDER — TRANDOLAPRIL 2 MG PO TABS
2.0000 mg | ORAL_TABLET | Freq: Every day | ORAL | Status: DC
  Administered 2012-11-12 – 2012-11-21 (×10): 2 mg via ORAL
  Filled 2012-11-12 (×10): qty 1

## 2012-11-12 MED ORDER — HEPARIN SODIUM (PORCINE) 5000 UNIT/ML IJ SOLN: 4000.0000 [IU] | INTRAMUSCULAR | Status: DC

## 2012-11-12 MED ORDER — HEPARIN (PORCINE) IN NACL 100-0.45 UNIT/ML-% IJ SOLN: 12.0000 [IU]/kg/h | Freq: Once | INTRAMUSCULAR | Status: AC

## 2012-11-12 MED ORDER — HEPARIN (PORCINE) IN NACL 2-0.9 UNIT/ML-% IJ SOLN
INTRAMUSCULAR | Status: AC
  Filled 2012-11-12: qty 1000

## 2012-11-12 MED ORDER — NITROGLYCERIN 0.4 MG SL SUBL
SUBLINGUAL_TABLET | SUBLINGUAL | Status: AC
  Administered 2012-11-12: 0.4 mg via SUBLINGUAL
  Filled 2012-11-12: qty 25

## 2012-11-12 MED ORDER — HEPARIN (PORCINE) IN NACL 100-0.45 UNIT/ML-% IJ SOLN
900.0000 [IU]/h | INTRAMUSCULAR | Status: DC
  Administered 2012-11-12: 850 [IU]/h via INTRAVENOUS
  Administered 2012-11-14 – 2012-11-19 (×5): 900 [IU]/h via INTRAVENOUS
  Filled 2012-11-12 (×11): qty 250

## 2012-11-12 MED ORDER — PANTOPRAZOLE SODIUM 40 MG PO TBEC
40.0000 mg | DELAYED_RELEASE_TABLET | Freq: Every day | ORAL | Status: DC
  Administered 2012-11-13 – 2012-11-21 (×9): 40 mg via ORAL
  Filled 2012-11-12 (×8): qty 1

## 2012-11-12 MED ORDER — DILTIAZEM HCL ER COATED BEADS 120 MG PO CP24
120.0000 mg | ORAL_CAPSULE | Freq: Every day | ORAL | Status: DC
  Administered 2012-11-12: 120 mg via ORAL
  Filled 2012-11-12 (×2): qty 1

## 2012-11-12 MED ORDER — SODIUM CHLORIDE 0.9 % IV SOLN: INTRAVENOUS | Status: DC

## 2012-11-12 MED ORDER — ACETAMINOPHEN 325 MG PO TABS: 650.0000 mg | ORAL_TABLET | ORAL | Status: DC | PRN

## 2012-11-12 MED ORDER — ASPIRIN 81 MG PO CHEW
81.0000 mg | CHEWABLE_TABLET | Freq: Every day | ORAL | Status: DC
  Administered 2012-11-13 – 2012-11-21 (×9): 81 mg via ORAL
  Filled 2012-11-12 (×9): qty 1

## 2012-11-12 MED ORDER — ASPIRIN 81 MG PO CHEW
324.0000 mg | CHEWABLE_TABLET | Freq: Once | ORAL | Status: AC
  Administered 2012-11-12: 324 mg via ORAL

## 2012-11-12 MED ORDER — HEPARIN SODIUM (PORCINE) 5000 UNIT/ML IJ SOLN
4000.0000 [IU] | Freq: Once | INTRAMUSCULAR | Status: AC
  Administered 2012-11-12: 4000 [IU] via INTRAVENOUS

## 2012-11-12 MED ORDER — NITROGLYCERIN 0.2 MG/ML ON CALL CATH LAB
INTRAVENOUS | Status: AC
  Filled 2012-11-12: qty 1

## 2012-11-12 MED ORDER — METOPROLOL SUCCINATE ER 25 MG PO TB24
25.0000 mg | ORAL_TABLET | Freq: Every day | ORAL | Status: DC
  Administered 2012-11-12 – 2012-11-19 (×8): 25 mg via ORAL
  Filled 2012-11-12 (×8): qty 1

## 2012-11-12 MED ORDER — ASPIRIN 81 MG PO TBEC: 81.0000 mg | DELAYED_RELEASE_TABLET | Freq: Every day | ORAL | Status: DC

## 2012-11-12 MED ORDER — MORPHINE SULFATE 2 MG/ML IJ SOLN: 1.0000 mg | INTRAMUSCULAR | Status: DC | PRN

## 2012-11-12 MED ORDER — ASPIRIN 81 MG PO CHEW: 324.0000 mg | CHEWABLE_TABLET | Freq: Once | ORAL | Status: DC

## 2012-11-12 MED ORDER — SODIUM CHLORIDE 0.9 % IV SOLN
INTRAVENOUS | Status: DC
  Administered 2012-11-13: 17:00:00 via INTRAVENOUS

## 2012-11-12 MED ORDER — ONDANSETRON HCL 4 MG/2ML IJ SOLN: 4.0000 mg | Freq: Four times a day (QID) | INTRAMUSCULAR | Status: DC | PRN

## 2012-11-12 MED ORDER — FUROSEMIDE 20 MG PO TABS
10.0000 mg | ORAL_TABLET | Freq: Every day | ORAL | Status: DC
  Administered 2012-11-12: 10 mg via ORAL
  Filled 2012-11-12 (×2): qty 0.5

## 2012-11-12 MED ORDER — NITROGLYCERIN 0.4 MG SL SUBL: 0.4000 mg | SUBLINGUAL_TABLET | SUBLINGUAL | Status: DC | PRN

## 2012-11-12 MED ORDER — ATORVASTATIN CALCIUM 80 MG PO TABS
80.0000 mg | ORAL_TABLET | Freq: Every day | ORAL | Status: DC
  Administered 2012-11-12 – 2012-11-20 (×9): 80 mg via ORAL
  Filled 2012-11-12 (×10): qty 1

## 2012-11-12 MED ORDER — LIDOCAINE HCL (PF) 1 % IJ SOLN
INTRAMUSCULAR | Status: AC
  Filled 2012-11-12: qty 30

## 2012-11-12 MED ORDER — CLOPIDOGREL BISULFATE 75 MG PO TABS
75.0000 mg | ORAL_TABLET | Freq: Every day | ORAL | Status: DC
  Administered 2012-11-13 – 2012-11-14 (×2): 75 mg via ORAL
  Filled 2012-11-12 (×3): qty 1

## 2012-11-12 MED ORDER — ASPIRIN 81 MG PO CHEW
CHEWABLE_TABLET | ORAL | Status: AC
  Administered 2012-11-12: 324 mg
  Filled 2012-11-12: qty 4

## 2012-11-12 NOTE — ED Notes (Signed)
Pt reports that he is pain free after two nitro

## 2012-11-12 NOTE — H&P (Signed)
    Pt was reexamined and existing H & P reviewed. No changes found.  Runell Gess, MD [facility] 11/12/2012 1:15 PM

## 2012-11-12 NOTE — CV Procedure (Signed)
Aaron [name] is a 79 y.o. male    [id number] LOCATION:  FACILITY: [facility]  PHYSICIAN: Nanetta Batty, M.D. Jul 03, 1923   DATE OF PROCEDURE:  11/12/2012  DATE OF DISCHARGE:   CARDIAC CATHETERIZATION     History obtained from chart review. Aaron [name] is an 79 year old Caucasian male patient of Dr. Inocente Salles said Dr. Walker Kehr who lives in refill. He had coronary artery bypass grafting in 1997. He underwent cardiac catheterization by Dr. Tonny Bollman on 9/6/7 revealing a patent LIMA to the LAD. The LAD itself was occluded at its origin. A high-grade mid circumflex stenosis which was stented with a bare-metal stent and a patent RCA which was described as normal. He developed chest pain today and was hit in a [facility] by Dr. Wayland Salinas. He has chronic A. Fib and right bundle-branch block.. There was inferior ST segment elevation. He's remained pain-free since transfer. He presents now for urgent cardiac catheterization to define his anatomy   PROCEDURE DESCRIPTION:    The patient was brought to the second floor  Cardiac cath lab in the postabsorptive state. He was not premedicated with. His right groinwas prepped and shaved in usual sterile fashion. Xylocaine 1% was used for local anesthesia. A 35 cm  6 French sheath was inserted into the right common femoral artery using standard Seldinger technique. 6 French right and left Judkins diagnostic catheters along with a 6 French pigtail catheter were used for selective coronary angiography and supravalvular aortography. I was unable to cross the aortic valve to perform left ventriculography. Visipaque dye was used for the entirety of the case (140 cc administered the patient). Retrograde aortic pressure was monitored during the case.   HEMODYNAMICS:    AO SYSTOLIC/AO DIASTOLIC: 146/85    ANGIOGRAPHIC RESULTS:   1. Left main; normal  2. LAD; occluded at its origin 3. Left circumflex; widely patent stent in the  midportion.  4. Right coronary artery; dominant with a 60-70% segmental proximal PDA stenosis 5.LIMA TO LAD; widely patent 6 Left ventriculography;not performed 7. Supra-valvular aortography: Supravalvular aortogram was performed using 30 cc of contrast at 20 cc per second. The was mildly dilated and "unwell". There is no obvious aortic insufficiency. The arch vessels appeared intact. There is no dissection noted.  IMPRESSION:Aaron [name] has a patent circumflex stent and moderate PDA disease and though he ectomy 3 flow. Unclear where his "culprit lesion is ". At this point I am recommending medical therapy. An ACT was checked and the sheath was removed in the cath lab. Pressure was held on the groin to achieve hemostasis. He left the lab in stable condition to unit 2900. His enzymes will be cycled and his renal function followed carefully.   Runell Gess MD, [facility] 11/12/2012 1:20 PM

## 2012-11-12 NOTE — ED Notes (Signed)
Report given to Morrie Sheldon, RN at cath lab [facility],

## 2012-11-12 NOTE — ED Notes (Signed)
(  per pt request)Pt ex-wife, Treyden Hakim, notified of patient condition and transfer and pt daughter's contact information obtained.  Pt daughter,Cynthia Huffines, notified of patient condition and transfer at work number [phone].

## 2012-11-12 NOTE — ED Notes (Signed)
[facility] here to transport pt. Dr, Fonnie Jarvis at bedside,

## 2012-11-12 NOTE — ED Provider Notes (Signed)
CSN: [id number]     Arrival date & time 11/12/12  1052 History    This chart was scribed for Aaron Horn, MD,  by Aaron [name], ED Scribe. The patient was seen in room APA14/APA14 and the patient's care was started at 11:00 AM.     Chief Complaint  Patient presents with  . Chest Pain    The history is provided by the patient and medical records. No language interpreter was used.   HPI Comments: Aaron [name] is a 79 y.o. male who presents to the Emergency Department complaining of severe constant CP that radiates to the L arm with onset occuring while sitting at mall one hour PTA.Unable to describe the quality Gradual onset Radiating pain to the left arm No treatment PTA. Pt reports that he had a cardiovascular stent placed for CAD. Pt denies any current sicknesses or any similar episodes. Pt denies taking any current medications. Pt denies SOB, vomiting, numbness, tingling or other associated Sxs. Pt denies any medications PTA.  Pt's current PCP is Dr. Assunta Found. Denies having cardiologist or taking any regular meds.Pt does not recall if has had irregular heartbeat in past.  Past Medical History  Diagnosis Date  . S/P PTCA (percutaneous transluminal coronary angioplasty) 1993    PIC  . Other abnormal clinical finding 2008    PIC WITH DES (CYPHER)  . CAD (coronary artery disease)     S/P CABG  . Aortic stenosis   . Hypertension   . Hyperlipidemia   . Gout   . Other abnormal clinical finding 04/28/2006    PIC -DES S/P FOR INSTENT RESTENOSIS CFX  . Other abnormal clinical finding 12/17/2005    PCI -BMS,S/P BIFURCATION CFX  . Other abnormal clinical finding 03/2006    PCI-PTCA,S/P FOR ISR OF NON DES  . Atrial fibrillation    Past Surgical History  Procedure Laterality Date  . Coronary artery bypass graft  09/07/1996    LIMA TO LAD MINIMALLY INVASIVE OFF PUMP  PROCEDURE RIGHT INGUINAL HERNIA 2019/07/496 ([facility]).   Family History  Problem Relation Age of Onset  .  Stroke Mother   . Stroke Father   . Other Brother 60    MYOCARDIAL INFARCTION,NO OTHER SIBLINGD WITH CAD   History  Substance Use Topics  . Smoking status: Never Smoker   . Smokeless tobacco: Not on file  . Alcohol Use: No    Review of Systems  Constitutional: Negative for chills.  HENT: Negative for trouble swallowing and neck pain.   Respiratory: Negative for chest tightness and shortness of breath.   Gastrointestinal: Negative for abdominal pain and blood in stool.  Neurological: Negative for dizziness and light-headedness.  Psychiatric/Behavioral: Negative for confusion.  A complete 10 system review of systems was obtained and all systems are negative except as noted in the HPI and PMH.    Allergies  Penicillins  Home Medications   No current outpatient prescriptions on file. BP 167/99  Pulse 93  Temp(Src) 98.1 F (36.7 C)  Resp 18  Ht 5\' 8"  (1.727 m)  Wt 155 lb (70.308 kg)  BMI 23.57 kg/m2  SpO2 97% Physical Exam  Nursing note and vitals reviewed. Constitutional:  Awake, alert, nontoxic appearance.  HENT:  Head: Atraumatic.  Eyes: Right eye exhibits no discharge. Left eye exhibits no discharge.  Neck: Neck supple.  Cardiovascular:  No murmur heard. irreguarly rhythm with no murmur   Pulmonary/Chest: Effort normal. He exhibits no tenderness.  Abdominal: Soft. There is no tenderness.  There is no rebound.  Musculoskeletal: He exhibits no edema and no tenderness.  Baseline ROM, no obvious new focal weakness.  Neurological:  Mental status and motor strength appears baseline for patient and situation.  Skin: No rash noted.  Psychiatric: He has a normal mood and affect.    ED Course  DIAGNOSTIC STUDIES: Oxygen Saturation is 97% on room, normal by my interpretation.   ECG: Atrial fibrillation, ventricular rate 104, right axis deviation, right bundle branch block, ST elevation inferior leads, compared with August 2010 ST elevation inferior leads now  present COORDINATION OF CARE: 11:10 AM Patient  understands and agrees with initial ED impression and plan with expectations set for ED visit. Code STEMI activated during initial Pt evaluation. D/w Dr. Allyson Sabal, Pt accepted for transfer. Pt stable in ED with no significant deterioration in condition. Patient informed of clinical course, understand medical decision-making process, and agree with plan.  CRITICAL CARE Performed by: Aaron [name] Total critical care time: initial eval, activating Code STEMI, arranging transfer, initial meds including anticoagulation. Critical care time was exclusive of separately billable procedures and treating other patients. Critical care was necessary to treat or prevent imminent or life-threatening deterioration. Critical care was time spent personally by me on the following activities: development of treatment plan with patient and/or surrogate as well as nursing, discussions with consultants, evaluation of patient's response to treatment, examination of patient, obtaining history from patient or surrogate, ordering and performing treatments and interventions, ordering and review of laboratory studies, ordering and review of radiographic studies, pulse oximetry and re-evaluation of patient's condition.   Procedures (including critical care time)  Labs Reviewed  CBC WITH DIFFERENTIAL - Abnormal; Notable for the following:    WBC 11.3 (*)    RBC 4.21 (*)    MCV 101.2 (*)    MCH 34.7 (*)    Neutro Abs 8.7 (*)    All other components within normal limits  COMPREHENSIVE METABOLIC PANEL - Abnormal; Notable for the following:    Glucose, Bld 102 (*)    GFR calc non Af Amer 50 (*)    GFR calc Af Amer 58 (*)    All other components within normal limits  PROTIME-INR - Abnormal; Notable for the following:    Prothrombin Time 15.5 (*)    All other components within normal limits  TROPONIN I - Abnormal; Notable for the following:    Troponin I 2.38 (*)     All other components within normal limits  MRSA PCR SCREENING  TROPONIN I  APTT  TROPONIN I  TROPONIN I  BASIC METABOLIC PANEL  HEPARIN LEVEL (UNFRACTIONATED)  CBC   Dg Chest Portable 1 View  11/12/2012   *RADIOLOGY REPORT*  Clinical Data: Chest pain.  PORTABLE CHEST - 1 VIEW  Comparison: 12/06/2008  Findings: Prior CABG.  Heart is borderline in size.  Tortuosity and ectasia of the thoracic aorta.  No confluent opacities or effusions.  No acute bony abnormality.  IMPRESSION: Borderline heart size.  No acute cardiopulmonary disease.   Original Report Authenticated By: Charlett Nose, M.D.   1. STEMI (ST elevation myocardial infarction)   2. Atrial fibrillation   3. Acute inferior myocardial infarction     MDM  The patient appears reasonably stabilized for transfer considering the current resources, flow, and capabilities available in the ED at this time, and I doubt any other [facility] requiring further screening and/or treatment in the ED prior to transfer. I personally performed the services described in this documentation, which  was scribed in my presence. The recorded information has been reviewed and is accurate.    Aaron Horn, MD 11/12/12 [phone]

## 2012-11-12 NOTE — Progress Notes (Signed)
ANTICOAGULATION CONSULT NOTE - Initial Consult  Pharmacy Consult for Heparin  Indication: chest pain/ACS  Allergies  Allergen Reactions  . Penicillins     Patient Measurements: Height: 5\' 8"  (172.7 cm) Weight: 150 lb 12.7 oz (68.4 kg) IBW/kg (Calculated) : 68.4   Vital Signs: Temp: 98.7 F (37.1 C) (08/02 1530) Temp src: Oral (08/02 1530) BP: 148/93 mmHg (08/02 1630) Pulse Rate: 102 (08/02 1630)  Labs:  Recent Labs  11/12/12 1116 11/12/12 1513  HGB 14.6  --   HCT 42.6  --   PLT 193  --   APTT 28  --   LABPROT 15.5*  --   INR 1.26  --   CREATININE 1.25  --   TROPONINI <0.30 2.38*    Estimated Creatinine Clearance: 40.3 ml/min (by C-G formula based on Cr of 1.25).   Medical History: Past Medical History  Diagnosis Date  . S/P PTCA (percutaneous transluminal coronary angioplasty) 1993    PIC  . Other abnormal clinical finding 2008    PIC WITH DES (CYPHER)  . CAD (coronary artery disease)     S/P CABG  . Aortic stenosis   . Hypertension   . Hyperlipidemia   . Gout   . Other abnormal clinical finding 04/28/2006    PIC -DES S/P FOR INSTENT RESTENOSIS CFX  . Other abnormal clinical finding 12/17/2005    PCI -BMS,S/P BIFURCATION CFX  . Other abnormal clinical finding 03/2006    PCI-PTCA,S/P FOR ISR OF NON DES  . Atrial fibrillation      Assessment: Aaron [name] with Hx CAD presented with CP today.  He went to cath lab without intervention.  Heparin drip to be started 8hr after sheath pulled - out at 1345 - heparin drip to start at 2145.   Goal of Therapy:  Heparin level 0.3-0.7 units/ml Monitor platelets by anticoagulation protocol: Yes   Plan:  Heparin drip 850 uts/hr HL, CBC daily  Leota Sauers Pharm.D. CPP, BCPS Clinical Pharmacist [phone] 11/12/2012 6:03 PM

## 2012-11-12 NOTE — ED Notes (Signed)
Pt presents to er with c/o mid center chest pain non radiating, denies any sob, n/v that started about a hour and half ago, Dr. Fonnie Jarvis at bedside upon arrival to er tx room, ekg performed upon arrival to er,

## 2012-11-12 NOTE — ED Notes (Signed)
[facility] called at this time for transport to [facility] for sode stemi. Ria Redcay

## 2012-11-12 NOTE — ED Notes (Signed)
02 2l Hope applied 

## 2012-11-12 NOTE — Progress Notes (Signed)
[facility] notified of positive troponin and a 18 run of V-tach. Patient has no c/o of pain and vitals are stable. New orders given and implemented.  Sawyer Kahan, Charlaine Dalton RN

## 2012-11-12 NOTE — H&P (Signed)
HPI: 79 year old male admitted with inferior infarct. Previously followed by Dr. Riley Kill. He has had prior coronary artery bypassing graft in 1998 with a LIMA to the LAD. He has also had previous PCI of the circumflex. He also has permanent atrial fibrillation and a history of aortic stenosis. Last echocardiogram in June of 2011 showed normal LV function and a mean aortic valve gradient of 13 mm of mercury. There was mild mitral regurgitation. The patient developed epigastric pain this morning while walking. It was described as a pressure with some pain in his left upper extremity. No associated symptoms. Pain was not pleuritic, positional or related to food. Lasted approximately 30 minutes and resolved at the any pain emergency room. His electrocardiogram suggested inferior ST elevation and he was brought to the Cath Lab. This revealed a normal left main, occluded LAD, patent circumflex and a 60-70% proximal PDA stenosis. The LIMA to the LAD was widely patent. There was no dissection. Patient is presently pain-free. Medical therapy is recommended.  Medications Prior to Admission  Medication Sig Dispense Refill  . aspirin 81 MG EC tablet Take 81 mg by mouth daily.        Marland Kitchen diltiazem (CARDIZEM CD) 120 MG 24 hr capsule Take 1 capsule (120 mg total) by mouth daily.  90 capsule  3  . furosemide (LASIX) 20 MG tablet Take 0.5 tablets (10 mg total) by mouth daily. Take one - half tablet by mouth daily  30 tablet  11  . metoprolol succinate (TOPROL-XL) 50 MG 24 hr tablet Take one-half tablet by mouth daily  90 tablet  3  . omeprazole (PRILOSEC) 20 MG capsule Take 20 mg by mouth 2 (two) times daily. Take 2 by mouth once daily      . rosuvastatin (CRESTOR) 10 MG tablet Take 1 tablet (10 mg total) by mouth daily.  90 tablet  3  . trandolapril (MAVIK) 2 MG tablet Take 1 tablet (2 mg total) by mouth daily.  90 tablet  3  . warfarin (COUMADIN) 5 MG tablet as directed.          Allergies  Allergen Reactions  .  Penicillins     Past Medical History  Diagnosis Date  . S/P PTCA (percutaneous transluminal coronary angioplasty) 1993    PIC  . Other abnormal clinical finding 2008    PIC WITH DES (CYPHER)  . CAD (coronary artery disease)     S/P CABG  . Aortic stenosis   . Hypertension   . Hyperlipidemia   . Gout   . Other abnormal clinical finding 04/28/2006    PIC -DES S/P FOR INSTENT RESTENOSIS CFX  . Other abnormal clinical finding 12/17/2005    PCI -BMS,S/P BIFURCATION CFX  . Other abnormal clinical finding 03/2006    PCI-PTCA,S/P FOR ISR OF NON DES  . Atrial fibrillation     Past Surgical History  Procedure Laterality Date  . Coronary artery bypass graft  09/07/1996    LIMA TO LAD MINIMALLY INVASIVE OFF PUMP  PROCEDURE RIGHT INGUINAL HERNIA Nov 19, 201998 ([facility]).    History   Social History  . Marital Status: Divorced    Spouse Name: N/A    Number of Children: 2  . Years of Education: N/A   Occupational History  .     Social History Main Topics  . Smoking status: Never Smoker   . Smokeless tobacco: Not on file  . Alcohol Use: No  . Drug Use: No  . Sexually Active: Not on file  Other Topics Concern  . Not on file   Social History Narrative   Lives in [place]  alone. 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    Family History  Problem Relation Age of Onset  . Stroke Mother   . Stroke Father   . Other Brother 20    MYOCARDIAL INFARCTION,NO OTHER SIBLINGD WITH CAD    ROS:  Problems with his memory but no fevers or chills, productive cough, hemoptysis, dysphasia, odynophagia, melena, hematochezia, dysuria, hematuria, rash, seizure activity, orthopnea, PND, pedal edema, claudication. Remaining systems are negative.  Physical Exam:   Blood pressure 147/88, pulse 105, temperature 98 F (36.7 C), resp. rate 20, height 5\' 8"  (1.727 m), weight 155 lb (70.308 kg), SpO2 99.00%.  General:  Well developed/well nourished in NAD status post cardiac  catheterization. Skin warm/dry Patient not depressed No peripheral clubbing Back-normal HEENT-normal/normal eyelids Neck supple/normal carotid upstroke bilaterally; no bruits; no JVD; no thyromegaly chest - CTA/ normal expansion CV - irregular/normal S1 and S2; no rubs or gallops;  PMI nondisplaced. 1/6 systolic murmur left sternal border. Abdomen -NT/ND, no HSM, no mass, + bowel sounds, no bruit 2+ femoral pulses, no bruits, status post catheterization right groin. Ext-no edema, chords, 1+ DP Neuro-grossly nonfocal  ECG atrial fibrillation, right bundle branch block, inferior ST elevation.  Results for orders placed during the hospital encounter of 11/12/12 (from the past 48 hour(s))  TROPONIN I     Status: None   Collection Time    11/12/12 11:16 AM      Result Value Range   Troponin I <0.30  <0.30 ng/mL   Comment:            Due to the release kinetics of cTnI,     a negative result within the first hours     of the onset of symptoms does not rule out     myocardial infarction with certainty.     If myocardial infarction is still suspected,     repeat the test at appropriate intervals.  CBC WITH DIFFERENTIAL     Status: Abnormal   Collection Time    11/12/12 11:16 AM      Result Value Range   WBC 11.3 (*) 4.0 - 10.5 K/uL   RBC 4.21 (*) 4.22 - 5.81 MIL/uL   Hemoglobin 14.6  13.0 - 17.0 g/dL   HCT 16.1  09.6 - 04.5 %   MCV 101.2 (*) 78.0 - 100.0 fL   MCH 34.7 (*) 26.0 - 34.0 pg   MCHC 34.3  30.0 - 36.0 g/dL   RDW 40.9  81.1 - 91.4 %   Platelets 193  150 - 400 K/uL   Neutrophils Relative % 76  43 - 77 %   Neutro Abs 8.7 (*) 1.7 - 7.7 K/uL   Lymphocytes Relative 13  12 - 46 %   Lymphs Abs 1.4  0.7 - 4.0 K/uL   Monocytes Relative 9  3 - 12 %   Monocytes Absolute 1.0  0.1 - 1.0 K/uL   Eosinophils Relative 2  0 - 5 %   Eosinophils Absolute 0.3  0.0 - 0.7 K/uL   Basophils Relative 0  0 - 1 %   Basophils Absolute 0.0  0.0 - 0.1 K/uL  APTT     Status: None   Collection  Time    11/12/12 11:16 AM      Result Value Range   aPTT 28  24 - 37 seconds  COMPREHENSIVE  METABOLIC PANEL     Status: Abnormal   Collection Time    11/12/12 11:16 AM      Result Value Range   Sodium 140  135 - 145 mEq/L   Potassium 3.7  3.5 - 5.1 mEq/L   Chloride 100  96 - 112 mEq/L   CO2 32  19 - 32 mEq/L   Glucose, Bld 102 (*) 70 - 99 mg/dL   BUN 21  6 - 23 mg/dL   Creatinine, Ser 1.61  0.50 - 1.35 mg/dL   Calcium 9.0  8.4 - 09.6 mg/dL   Total Protein 6.4  6.0 - 8.3 g/dL   Albumin 3.5  3.5 - 5.2 g/dL   AST 20  0 - 37 U/L   ALT 17  0 - 53 U/L   Alkaline Phosphatase 90  39 - 117 U/L   Total Bilirubin 0.7  0.3 - 1.2 mg/dL   GFR calc non Af Amer 50 (*) >90 mL/min   GFR calc Af Amer 58 (*) >90 mL/min   Comment:            The eGFR has been calculated     using the CKD EPI equation.     This calculation has not been     validated in all clinical     situations.     eGFR's persistently     <90 mL/min signify     possible Chronic Kidney Disease.  PROTIME-INR     Status: Abnormal   Collection Time    11/12/12 11:16 AM      Result Value Range   Prothrombin Time 15.5 (*) 11.6 - 15.2 seconds   INR 1.26  0.00 - 1.49    Dg Chest Portable 1 View  11/12/2012   *RADIOLOGY REPORT*  Clinical Data: Chest pain.  PORTABLE CHEST - 1 VIEW  Comparison: 12/06/2008  Findings: Prior CABG.  Heart is borderline in size.  Tortuosity and ectasia of the thoracic aorta.  No confluent opacities or effusions.  No acute bony abnormality.  IMPRESSION: Borderline heart size.  No acute cardiopulmonary disease.   Original Report Authenticated By: Charlett Nose, M.D.    Assessment/Plan 1 inferior infarct-patient's electrocardiogram shows inferior ST elevation. However cardiac catheterization has been performed and per Dr. Allyson Sabal there is no obvious culprit. We'll treat with aspirin, Plavix and statin. Continue low-dose beta-blockade. 2 aortic stenosis-patient will need a followup echocardiogram. However he does  not sound to have significant aortic stenosis on exam. 3 permanent atrial fibrillation-the patient has multiple embolic risk factors. He would benefit from anticoagulation long-term. Previous notes state he was on Coumadin but the patient states he is taking absolutely no medications at present. He is having problems with his memory as well. He has also fallen recently. I do not think he will be a good long-term anticoagulation candidate. Follow heart rate and adjust regimen as indicated. 4 coronary artery disease status post coronary artery bypass graft-will treat with aspirin, Plavix and statin. 5 hypertension-watch blood pressure and adjust regimen as needed.  [name] MD 11/12/2012, 2:29 PM

## 2012-11-12 NOTE — ED Notes (Signed)
Pt c/o central cp x 1 hour. Denies sob/n/v/numbness/tingling. nad noted.

## 2012-11-13 LAB — BASIC METABOLIC PANEL
BUN: 20 mg/dL (ref 6–23)
Chloride: 102 mEq/L (ref 96–112)
GFR calc Af Amer: 56 mL/min — ABNORMAL LOW (ref >90)
GFR calc non Af Amer: 49 mL/min — ABNORMAL LOW (ref >90)
Potassium: 4.5 mEq/L (ref 3.5–5.1)

## 2012-11-13 LAB — CBC
Hemoglobin: 12.8 g/dL — ABNORMAL LOW (ref 13.0–17.0)
MCH: 35.2 pg — ABNORMAL HIGH (ref 26.0–34.0)
MCHC: 35.1 g/dL (ref 30.0–36.0)
Platelets: 174 10*3/uL (ref 150–400)
RBC: 3.64 MIL/uL — ABNORMAL LOW (ref 4.22–5.81)

## 2012-11-13 LAB — HEPARIN LEVEL (UNFRACTIONATED)
Heparin Unfractionated: 0.22 IU/mL — ABNORMAL LOW (ref 0.30–0.70)
Heparin Unfractionated: 0.42 IU/mL (ref 0.30–0.70)

## 2012-11-13 LAB — POCT ACTIVATED CLOTTING TIME: Activated Clotting Time: 165 seconds

## 2012-11-13 MED ORDER — FUROSEMIDE 20 MG PO TABS
20.0000 mg | ORAL_TABLET | Freq: Every day | ORAL | Status: DC
  Administered 2012-11-13 – 2012-11-21 (×9): 20 mg via ORAL
  Filled 2012-11-13 (×9): qty 1

## 2012-11-13 NOTE — Progress Notes (Signed)
   Subjective:  Denies CP or dyspnea   Objective:  Filed Vitals:   11/12/12 2355 11/13/12 0300 11/13/12 0408 11/13/12 0724  BP: 103/59  98/58 112/65  Pulse:    61  Temp: 98 F (36.7 C) 98.4 F (36.9 C)  98.6 F (37 C)  TempSrc: Oral Oral  Oral  Resp: 17  16 21   Height:      Weight:      SpO2: 95%  95% 97%    Intake/Output from previous day:  Intake/Output Summary (Last 24 hours) at 11/13/12 1610 Last data filed at 11/13/12 0800  Gross per 24 hour  Intake 1068.5 ml  Output   2125 ml  Net -1056.5 ml    Physical Exam: Physical exam: Well-developed well-nourished in no acute distress.  Skin is warm and dry.  HEENT is normal.  Neck is supple.  Chest with mildly diminished BS bases Cardiovascular exam is irregular Abdominal exam nontender or distended. No masses palpated. Right groin with no hematoma and no bruit Extremities show no edema. neuro grossly intact    Lab Results: Basic Metabolic Panel:  Recent Labs  96/04/54 1116 11/13/12 0133  NA 140 138  K 3.7 4.5  CL 100 102  CO2 32 29  GLUCOSE 102* 105*  BUN 21 20  CREATININE 1.25 1.28  CALCIUM 9.0 8.1*   CBC:  Recent Labs  11/12/12 1116 11/13/12 0133  WBC 11.3* 9.0  NEUTROABS 8.7*  --   HGB 14.6 12.8*  HCT 42.6 36.5*  MCV 101.2* 100.3*  PLT 193 174   Cardiac Enzymes:  Recent Labs  11/12/12 1513 11/12/12 1951 11/13/12 0133  TROPONINI 2.38* 4.66* 5.09*     Assessment/Plan:  1 inferior infarct-patient's electrocardiogram revealed inferior ST elevation. However cardiac catheterization has been performed and per Dr. Allyson Sabal there is no obvious culprit. Enzymes are positive; patient remains pain free. Treat with aspirin, plavix, beta blocker and statin. Continue IV heparin for 48-72 hours. 2 aortic stenosis-Schedule echocardiogram to assess severity (and LV function given recent MI). However he does not sound to have significant aortic stenosis on exam. Mild increase in volume; changes lasix  to 20 mg daily and check renal function in AM. 3 permanent atrial fibrillation-the patient has multiple embolic risk factors. He would benefit from anticoagulation long-term. Previous notes state he was on Coumadin but the patient stated at time of admission he was taking absolutely no medications at home. He is having problems with his memory as well. He has also fallen recently. I do not think he will be a good long-term anticoagulation candidate. HR decreased; DC cardizem and continue beta blocker.  4 coronary artery disease status post coronary artery bypass graft-will treat with aspirin, Plavix and statin.  5 hypertension-watch blood pressure and adjust regimen as needed.   [name] 11/13/2012, 8:28 AM

## 2012-11-13 NOTE — Progress Notes (Signed)
Patient becoming very agitated and attempting to get up without assistance. Family is at bedside and trying to talk to patient but it is unsuccessful. Allowed family to wheel patient around unit in wheelchair to allow him to move about some in hopes that it might help him settle down some. Will continue to assess and monitor.

## 2012-11-13 NOTE — Progress Notes (Signed)
Notified [name] PA of patients episode of being combative and confused. If patient should continue to have problems call fellow on call tonight for further instruction. Patients daughter currently at bedside. No new orders at this time. Will continue to assess and monitor.

## 2012-11-13 NOTE — Progress Notes (Signed)
Patient lives home alone and continues to drive. There are concerns for his ability to remain at home since he has had numerous falls at home and has even stopped taking his medications according to his report and daughter Arline Asp. At this time patient is very confused and combative. Patients daughter Arline Asp and her spouse had went downstairs to allow him some time to rest and within 30 minutes bed alarm heard and found him sitting on side of bed demanding to go home. Patient did not know where he was at, nor did he know what had happened to him that brought him in. Tried to reorient him and he only became more argumentative and stated that he just wanted to go home and die there. Told him that I would call his daughter. Will continue to assess and monitor.

## 2012-11-13 NOTE — Progress Notes (Signed)
ANTICOAGULATION CONSULT NOTE - Follow Up Consult  Pharmacy Consult for heparin Indication: atrial fibrillation and CAD  Labs:  Recent Labs  11/12/12 1116 11/12/12 1513 11/12/12 1951 11/13/12 0133 11/13/12 1217  HGB 14.6  --   --  12.8*  --   HCT 42.6  --   --  36.5*  --   PLT 193  --   --  174  --   APTT 28  --   --   --   --   LABPROT 15.5*  --   --   --   --   INR 1.26  --   --   --   --   HEPARINUNFRC  --   --   --  0.22* 0.42  CREATININE 1.25  --   --  1.28  --   TROPONINI <0.30 2.38* 4.66* 5.09*  --     Assessment: 79yo male subtherapeutic on heparin with initial dosing for Afib/CAD though lab drawn early.  Goal of Therapy:  Heparin level 0.3-0.7 units/ml   Plan:  Continue heparin at current rate Follow up AM labs  Thank you. Okey Regal, PharmD [phone]   11/13/2012,2:00 PM

## 2012-11-13 NOTE — Progress Notes (Signed)
ANTICOAGULATION CONSULT NOTE - Follow Up Consult  Pharmacy Consult for heparin Indication: atrial fibrillation and CAD  Labs:  Recent Labs  11/12/12 1116 11/12/12 1513 11/12/12 1951 11/13/12 0133  HGB 14.6  --   --  12.8*  HCT 42.6  --   --  36.5*  PLT 193  --   --  174  APTT 28  --   --   --   LABPROT 15.5*  --   --   --   INR 1.26  --   --   --   HEPARINUNFRC  --   --   --  0.22*  CREATININE 1.25  --   --   --   TROPONINI <0.30 2.38* 4.66*  --     Assessment: 79yo male subtherapeutic on heparin with initial dosing for Afib/CAD though lab drawn early.  Goal of Therapy:  Heparin level 0.3-0.7 units/ml   Plan:  Will increase heparin gtt slightly to 900 units/hr and check level in 8hr.  Vernard Gambles, PharmD, BCPS  11/13/2012,2:19 AM

## 2012-11-14 DIAGNOSIS — I219 Acute myocardial infarction, unspecified: Secondary | ICD-10-CM

## 2012-11-14 DIAGNOSIS — I359 Nonrheumatic aortic valve disorder, unspecified: Secondary | ICD-10-CM

## 2012-11-14 DIAGNOSIS — R413 Other amnesia: Secondary | ICD-10-CM | POA: Diagnosis present

## 2012-11-14 DIAGNOSIS — E7800 Pure hypercholesterolemia, unspecified: Secondary | ICD-10-CM

## 2012-11-14 DIAGNOSIS — I2510 Atherosclerotic heart disease of native coronary artery without angina pectoris: Secondary | ICD-10-CM

## 2012-11-14 LAB — CBC
HCT: 38 % — ABNORMAL LOW (ref 39.0–52.0)
Hemoglobin: 13.6 g/dL (ref 13.0–17.0)
MCH: 35.6 pg — ABNORMAL HIGH (ref 26.0–34.0)
MCHC: 35.8 g/dL (ref 30.0–36.0)
MCV: 99.5 fL (ref 78.0–100.0)
Platelets: 163 10*3/uL (ref 150–400)
RBC: 3.82 MIL/uL — ABNORMAL LOW (ref 4.22–5.81)
RDW: 14.1 % (ref 11.5–15.5)
WBC: 9.1 10*3/uL (ref 4.0–10.5)

## 2012-11-14 LAB — BASIC METABOLIC PANEL
BUN: 22 mg/dL (ref 6–23)
CO2: 27 mEq/L (ref 19–32)
Calcium: 8.3 mg/dL — ABNORMAL LOW (ref 8.4–10.5)
Chloride: 105 mEq/L (ref 96–112)
Creatinine, Ser: 1.31 mg/dL (ref 0.50–1.35)
GFR calc Af Amer: 55 mL/min — ABNORMAL LOW (ref >90)
GFR calc non Af Amer: 47 mL/min — ABNORMAL LOW (ref >90)
Glucose, Bld: 95 mg/dL (ref 70–99)
Potassium: 4 mEq/L (ref 3.5–5.1)
Sodium: 142 mEq/L (ref 135–145)

## 2012-11-14 LAB — HEPARIN LEVEL (UNFRACTIONATED): Heparin Unfractionated: 0.58 IU/mL (ref 0.30–0.70)

## 2012-11-14 MED ORDER — PATIENT'S GUIDE TO USING COUMADIN BOOK
Freq: Once | Status: AC
  Administered 2012-11-14: 13:00:00
  Filled 2012-11-14: qty 1

## 2012-11-14 MED ORDER — DOCUSATE SODIUM 100 MG PO CAPS
100.0000 mg | ORAL_CAPSULE | Freq: Two times a day (BID) | ORAL | Status: DC
  Administered 2012-11-14 – 2012-11-21 (×14): 100 mg via ORAL
  Filled 2012-11-14 (×16): qty 1

## 2012-11-14 MED ORDER — WARFARIN SODIUM 4 MG PO TABS
4.0000 mg | ORAL_TABLET | Freq: Once | ORAL | Status: AC
  Administered 2012-11-14: 4 mg via ORAL
  Filled 2012-11-14: qty 1

## 2012-11-14 MED ORDER — WARFARIN - PHARMACIST DOSING INPATIENT
Freq: Every day | Status: DC
  Administered 2012-11-15 – 2012-11-17 (×2)

## 2012-11-14 NOTE — Progress Notes (Signed)
Patient Name: Aaron [name] Date of Encounter: 11/14/2012  Principal Problem:   Acute inferior myocardial infarction Active Problems:   HYPERCHOLESTEROLEMIA  IIA   CAD, NATIVE VESSEL   AORTIC STENOSIS/ INSUFFICIENCY, NON-RHEUMATIC   Atrial fibrillation   Memory loss    SUBJECTIVE: No chest pain, no SOB. Drives some and cares for house and garden at baseline. Does not remember confusion last pm.  OBJECTIVE Filed Vitals:   11/14/12 0000 11/14/12 0359 11/14/12 0400 11/14/12 0754  BP: 131/77 132/82  126/89  Pulse: 66  83 90  Temp: 97.7 F (36.5 C)  98.1 F (36.7 C) 98.1 F (36.7 C)  TempSrc: Oral  Oral Oral  Resp: 18  19 19   Height:      Weight:      SpO2: 96%  97% 98%    Intake/Output Summary (Last 24 hours) at 11/14/12 1111 Last data filed at 11/14/12 0800  Gross per 24 hour  Intake    879 ml  Output   2170 ml  Net  -1291 ml   Filed Weights   11/12/12 1057 11/12/12 1500  Weight: 155 lb (70.308 kg) 150 lb 12.7 oz (68.4 kg)    PHYSICAL EXAM General: Well developed, well nourished, elderly male in no acute distress. Head: Normocephalic, atraumatic.  Neck: Supple without bruits, JVD not elevated. Lungs:  Resp regular and unlabored, few rales bases. Heart: RRR, S1, S2, no S3, S4, 2-3/6 murmur; no rub. Abdomen: Soft, non-tender, non-distended, BS + x 4.  Extremities: No clubbing, cyanosis, no edema.  Neuro: Alert and oriented X 3. Moves all extremities spontaneously. Psych: Normal affect.  LABS: CBC:  Recent Labs  11/12/12 1116 11/13/12 0133 11/14/12 0518  WBC 11.3* 9.0 9.1  NEUTROABS 8.7*  --   --   HGB 14.6 12.8* 13.6  HCT 42.6 36.5* 38.0*  MCV 101.2* 100.3* 99.5  PLT 193 174 163   INR:  Recent Labs  11/12/12 1116  INR 1.26   Basic Metabolic Panel:  Recent Labs  16/10/96 0133 11/14/12 0518  NA 138 142  K 4.5 4.0  CL 102 105  CO2 29 27  GLUCOSE 105* 95  BUN 20 22  CREATININE 1.28 1.31  CALCIUM 8.1* 8.3*   Liver Function  Tests:  Recent Labs  11/12/12 1116  AST 20  ALT 17  ALKPHOS 90  BILITOT 0.7  PROT 6.4  ALBUMIN 3.5   Cardiac Enzymes:  Recent Labs  11/12/12 1513 11/12/12 1951 11/13/12 0133  TROPONINI 2.38* 4.66* 5.09*    TELE:  Atrial fibrillation      Radiology/Studies: Dg Chest Portable 1 View 11/12/2012   *RADIOLOGY REPORT*  Clinical Data: Chest pain.  PORTABLE CHEST - 1 VIEW  Comparison: 12/06/2008  Findings: Prior CABG.  Heart is borderline in size.  Tortuosity and ectasia of the thoracic aorta.  No confluent opacities or effusions.  No acute bony abnormality.  IMPRESSION: Borderline heart size.  No acute cardiopulmonary disease.   Original Report Authenticated By: Charlett Nose, M.D.     Current Medications:  . aspirin  81 mg Oral Daily  . atorvastatin  80 mg Oral q1800  . clopidogrel  75 mg Oral Q breakfast  . docusate sodium  100 mg Oral BID  . furosemide  20 mg Oral Daily  . metoprolol succinate  25 mg Oral Daily  . pantoprazole  40 mg Oral Daily  . trandolapril  2 mg Oral Daily   . sodium chloride 10 mL/hr at 11/13/12 1714  .  heparin 900 Units/hr (11/14/12 0031)    ASSESSMENT AND PLAN: Active Problems:   Acute inferior myocardial infarction - no clear culprit vessel at cath     AS - echo pending    Confusion - possible sundowning but pt has not been taking meds.    Atrial fib, permanent- has rate control with Toprol XL 25, continue to follow.    Anticoag - with possible compliance issues and concern for falls, ASA/Plavix only for now     DNR - pt has a living will and requests a DNR.    Plan - increase mobility, possible tx to telemetry, d/c when medically stable.   Signed, Hasaan Radde [place] , PA-C 11:11 AM 11/14/2012 Patient seen and examined and history reviewed. Agree with above findings and plan. Patient with significant confusion and agitation last night. Clearer this am and oriented to place. Denies any chest pain. Was previously on coumadin for Afib. States he  had stopped taking his medication completely. Spoke with daughter who is concerned about his ability to care for himself. He has functioned independently but clearly has more problems with memory. Family lives in [place]. He had a fall about a month ago going up his back steps but otherwise hasn't had any falls. Ecg today shows resolution of his inferolateral ST elevation. ? If ACS related to coronary spasm or thrombus that lysed. He is in afib with controlled ventricular response. Awaiting Echo results. I think we need to resume coumadin. Will stop Plavix. We need assessment of his needs post DC from Case management. Family would like him in a facility but patient clearly doesn't want this. Will need help assuring he takes his medication as prescibed. He should not be driving.   Theron Arista [facility] 11/14/2012 11:11 AM

## 2012-11-14 NOTE — Progress Notes (Signed)
ANTICOAGULATION CONSULT NOTE - Follow Up Consult  Pharmacy Consult for heparin, and warfarin Indication: atrial fibrillation and CAD  Labs:  Recent Labs  11/12/12 1116 11/12/12 1513 11/12/12 1951 11/13/12 0133 11/13/12 1217 11/14/12 0518  HGB 14.6  --   --  12.8*  --  13.6  HCT 42.6  --   --  36.5*  --  38.0*  PLT 193  --   --  174  --  163  APTT 28  --   --   --   --   --   LABPROT 15.5*  --   --   --   --   --   INR 1.26  --   --   --   --   --   HEPARINUNFRC  --   --   --  0.22* 0.42 0.58  CREATININE 1.25  --   --  1.28  --  1.31  TROPONINI <0.30 2.38* 4.66* 5.09*  --   --     Admit Complaint: 79 y.o.  male  admitted 11/12/2012 with CP.  Pharmacy consulted to dose heparin and warfarin.  PMH: Lives at home alone, Afib (had stopped warfarin PTA), CAD  Overnight Events: 11/14/2012, resuming warfarin,   Assessment: Anticoagulation: Afib, VTE Prophylaxis, IV heparin h/h stable no bleeding noted, warfarin; heparin level at goal, baseline INR 1.26  Warfarin PTA some combination of warfarin 1mg  and 4 mg tablets, per patient has stopped taking it PTA  Cardiovascular: CAD, hyperlipidemia, AS: asa, atorvastatin, furosemide, metop, trandolopril, stopping plavix Current Weight: 150 lb 12.7 oz (68.4 kg)  GI / Nutrition: Protonix  Nephrology/Urology/Electrolytes/Optho: CrCl 38  PTA Medication Issues: All Home Meds Ordered:   Best Practices: VTE Prophylaxis:  Heparin/warfarin  Goal of Therapy:  Heparin level 0.3-0.7 units/ml INR 2-3   Plan:  Continue heparin at current rate Warfarin 4 mg daily Daily heparin level, CBC, INR   Thank you for allowing pharmacy to be a part of this patients care team.  Lovenia Kim Pharm.D., BCPS Clinical Pharmacist 11/14/2012 11:16 AM Pager: [phone] Phone: [phone]

## 2012-11-14 NOTE — Progress Notes (Signed)
  Echocardiogram 2D Echocardiogram has been performed.  Aaron [name] FRANCES 11/14/2012, 12:51 PM

## 2012-11-14 NOTE — Progress Notes (Signed)
Pt family has given a copy of pt's living will. Copy placed in chart.

## 2012-11-14 NOTE — Care Management Note (Addendum)
    Page 1 of 1   11/17/2012     3:17:49 PM   CARE MANAGEMENT NOTE 11/17/2012  Patient:  Aaron [name], Aaron [name]   Account Number:  [id number]  Date Initiated:  11/14/2012  Documentation initiated by:  Junius Creamer  Subjective/Objective Assessment:   adm w mi     Action/Plan:   lives alone, pcp dr Assunta Found   Anticipated DC Date:     Anticipated DC Plan:  SKILLED NURSING FACILITY  In-house referral  Clinical Social Worker      DC Planning Services  CM consult      Choice offered to / List presented to:             Status of service:  Completed, signed off Medicare Important Message given?   (If response is "NO", the following Medicare IM given date fields will be blank) Date Medicare IM given:   Date Additional Medicare IM given:    Discharge Disposition:  SKILLED NURSING FACILITY  Per UR Regulation:  Reviewed for med. necessity/level of care/duration of stay  If discussed at Long Length of Stay Meetings, dates discussed:    Comments:  11-17-12 [address] Tomi Bamberger, RN,BSN [phone] CSW assisting pt with disposition needs to [facility] in [place], [place] when pt is medically [place].  8/4 1257 debbie dowell rn,bsn spoke w da's janet and cindy. janet has poa. they are interested in short term snf then poss alf. pt has dementia and has been getting worse last 6months. da thinks pt has long term care policy and she's going to ck on that. sw ref made.

## 2012-11-14 NOTE — Clinical Social Work Note (Addendum)
Clinical Social Work Department BRIEF PSYCHOSOCIAL ASSESSMENT 11/14/2012  Patient:  Aaron [name], Aaron [name]     Account Number:  [id number]     Admit date:  11/12/2012  Clinical Social Worker:  Hulan Fray  Date/Time:  11/14/2012 03:51 PM  Referred by:  RN  Date Referred:  11/13/2012 Referred for  SNF Placement   Other Referral:   Interview type:  Patient Other interview type:   family- Daughter Arline Asp at bedside    PSYCHOSOCIAL DATA Living Status:  ALONE Admitted from facility:   Level of care:   Primary support name:  Evlyn Kanner [phone]) Primary support relationship to patient:  CHILD, ADULT Degree of support available:   supportive    CURRENT CONCERNS Current Concerns  Post-Acute Placement   Other Concerns:    SOCIAL WORK ASSESSMENT / PLAN Clinical Social Worker received referral for patient needing placement at discharge. CSW introduced self and explained reason for visit. Patient's daughter was at bedside. CSW was explaining SNF process with patient and patient declined placement. Daughter requested to speak with CSW outside room. Daughter appeared upset that SNF placement was mentioned to patient, because she and her sister were trying to work out a plan first and then relay to patient. CSW was unaware of daughter's plans. CSW informed daughter that CSW must notify patient of plans when receiving the consult. Daughter expressed that the patient is "stubborn" and patient is "not safe going back home alone." Daughter appeared very adament that patient cannot return home and was not happy CSW spoke to him about SNF placement. Daughter reported that she works and cannot look at patient 24/7. CSW validated concerns. Daughter found CSW later to apologized for getting upset. Daughter reported interest in referrals being sent out to [facility] for [facility] search    PT/OT evaluation would help with potential SNF placement and maybe psych consult for capacity  determination. CSW will continue to follow.   Assessment/plan status:  Psychosocial Support/Ongoing Assessment of Needs Other assessment/ plan:   Information/referral to community resources:   Provided daughter with SNF packet    PATIENT'S/FAMILY'S RESPONSE TO PLAN OF CARE: Daughter appeared upset with CSW for speaking with patient about  SNF placement  because she and her sister were trying to come to an arrangement with CSW first and then discuss with patient. Daughter later apologized to CSW and appreciative of CSW's assistance.

## 2012-11-15 LAB — PROTIME-INR: INR: 1.03 (ref 0.00–1.49)

## 2012-11-15 LAB — HEPARIN LEVEL (UNFRACTIONATED): Heparin Unfractionated: 0.57 IU/mL (ref 0.30–0.70)

## 2012-11-15 LAB — CBC
HCT: 39.7 % (ref 39.0–52.0)
Hemoglobin: 13.6 g/dL (ref 13.0–17.0)
MCV: 101 fL — ABNORMAL HIGH (ref 78.0–100.0)
RBC: 3.93 MIL/uL — ABNORMAL LOW (ref 4.22–5.81)
RDW: 14.5 % (ref 11.5–15.5)
WBC: 8.9 10*3/uL (ref 4.0–10.5)

## 2012-11-15 MED ORDER — WARFARIN SODIUM 4 MG PO TABS
4.0000 mg | ORAL_TABLET | Freq: Once | ORAL | Status: AC
  Administered 2012-11-15: 4 mg via ORAL
  Filled 2012-11-15: qty 1

## 2012-11-15 NOTE — Evaluation (Signed)
Physical Therapy Evaluation Patient Details Name: Aaron [name] MRN: [id number] DOB: [birth year] Today's Date: 11/15/2012 Time: 5621-3086 PT Time Calculation (min): 24 min  PT Assessment / Plan / Recommendation History of Present Illness  79 year old male admitted with inferior infarct. Previously followed by Dr. Riley Kill. He has had prior coronary artery bypassing graft in 1998 with a LIMA to the LAD. He has also had previous PCI of the circumflex. He also has permanent atrial fibrillation and a history of aortic stenosis. Last echocardiogram in June of 2011 showed normal LV function and a mean aortic valve gradient of 13 mm of mercury. There was mild mitral regurgitation. The patient developed epigastric pain this morning while walking. It was described as a pressure with some pain in his left upper extremity. No associated symptoms. Pain was not pleuritic, positional or related to food. Lasted approximately 30 minutes and resolved at the any pain emergency room. His electrocardiogram suggested inferior ST elevation and he was brought to the Cath Lab. This revealed a normal left main, occluded LAD, patent circumflex and a 60-70% proximal PDA stenosis. The LIMA to the LAD was widely patent.   Clinical Impression  Pt with cognitive deficits limiting capability to care for self and manage medications. Pt admitted to hospital for cardiac complications due to pt not taking medications. Pt functioning at supervision level and greatly benefit from assisted living, however unsure if patient has supportive funds to enroll in assisted living. If able pt could stay with family with intermittent supervision for medications and to eliminate pt driving and running errands. Otherwise pt would need ST-SNF for safe mobility and medication management due to pt at mild increased falls risk at demo'd by 18/24 on DGI.    PT Assessment  Patient needs continued PT services    Follow Up Recommendations  Supervision -  Intermittent (assisted living)    Does the patient have the potential to tolerate intense rehabilitation      Barriers to Discharge Decreased caregiver support pt lived alone however due to poor memory forgets to take his pills. Pt unable to stay with family at this time. Pt with fall 6 weeks ago up his stairs    Equipment Recommendations  None recommended by PT    Recommendations for Other Services     Frequency Min 2X/week    Precautions / Restrictions Precautions Precautions: Fall Restrictions Weight Bearing Restrictions: No   Pertinent Vitals/Pain Denies pain      Mobility  Bed Mobility Bed Mobility: Supine to Sit Supine to Sit: 5: Supervision;HOB elevated Details for Bed Mobility Assistance: safe technique but HOB elevated Transfers Transfers: Sit to Stand;Stand to Sit Sit to Stand: 4: Min guard;With upper extremity assist;With armrests;From chair/3-in-1 Stand to Sit: 5: Supervision;With upper extremity assist;To chair/3-in-1 Details for Transfer Assistance: safe technique Ambulation/Gait Ambulation/Gait Assistance: 4: Min guard Ambulation Distance (Feet): 250 Feet Assistive device: None Ambulation/Gait Assistance Details: no episodes of LOB, however demo's lateral sway Gait Pattern: Step-through pattern Gait velocity: wfl for age Stairs: Yes Stairs Assistance: 4: Min guard Stair Management Technique: One rail Right;Step to pattern Number of Stairs: 4 (limited by IV pole)    Exercises     PT Diagnosis: Difficulty walking  PT Problem List: Decreased strength;Decreased activity tolerance;Decreased balance PT Treatment Interventions: Gait training;Stair training;Functional mobility training;Therapeutic activities;Therapeutic exercise     PT Goals(Current goals can be found in the care plan section) Acute Rehab PT Goals Patient Stated Goal: home when able PT Goal Formulation: With patient/family Time For  Goal Achievement: 11/22/12 Potential to Achieve Goals:  Good  Visit Information  Last PT Received On: 11/15/12 History of Present Illness: 79 year old male admitted with inferior infarct. Previously followed by Dr. Riley Kill. He has had prior coronary artery bypassing graft in 1998 with a LIMA to the LAD. He has also had previous PCI of the circumflex. He also has permanent atrial fibrillation and a history of aortic stenosis. Last echocardiogram in June of 2011 showed normal LV function and a mean aortic valve gradient of 13 mm of mercury. There was mild mitral regurgitation. The patient developed epigastric pain this morning while walking. It was described as a pressure with some pain in his left upper extremity. No associated symptoms. Pain was not pleuritic, positional or related to food. Lasted approximately 30 minutes and resolved at the any pain emergency room. His electrocardiogram suggested inferior ST elevation and he was brought to the Cath Lab. This revealed a normal left main, occluded LAD, patent circumflex and a 60-70% proximal PDA stenosis. The LIMA to the LAD was widely patent.        Prior Functioning  Home Living Family/patient expects to be discharged to:: Unsure Living Arrangements: Alone Additional Comments: pt lived alone however is unable to care for himself. per family pt non compliant with medications due to pt's poor memory Prior Function Level of Independence: Independent Comments: pt did have fall 6 weeks ago up the stairs. pt was driving and running errands Communication Communication: No difficulties Dominant Hand: Right    Cognition  Cognition Arousal/Alertness: Awake/alert Behavior During Therapy: WFL for tasks assessed/performed Overall Cognitive Status: Within Functional Limits for tasks assessed    Extremity/Trunk Assessment Upper Extremity Assessment Upper Extremity Assessment: Generalized weakness Lower Extremity Assessment Lower Extremity Assessment: Generalized weakness Cervical / Trunk  Assessment Cervical / Trunk Assessment:  (limited cervical ROM and forward head)   Balance Standardized Balance Assessment Standardized Balance Assessment: Dynamic Gait Index Dynamic Gait Index Level Surface: Normal Change in Gait Speed: Normal Gait with Horizontal Head Turns: Mild Impairment Gait with Vertical Head Turns: Mild Impairment Gait and Pivot Turn: Mild Impairment Step Over Obstacle: Mild Impairment Step Around Obstacles: Normal Steps: Moderate Impairment Total Score: 18  End of Session PT - End of Session Equipment Utilized During Treatment: Gait belt Activity Tolerance: Patient tolerated treatment well Patient left: in chair;with call bell/phone within reach Nurse Communication: Mobility status  GP     Marcene Brawn 11/15/2012, 8:57 AM  Lewis Shock, PT, DPT Pager #: [phone] Office #: [phone]

## 2012-11-15 NOTE — Progress Notes (Signed)
Chaplain responded to Code stemi page and reported to Cath Lab. Chaplain served as Print production planner between [employer] and family members. Chaplain escort family members to [address] where pt was later admitted to after Cath Lab procedure. Chaplain also provided ministry of presence and empathic listening and shared words of encouragement with family members in the waiting area. Family thanked chaplain for his support and presence. Kelle Darting [phone]  11/12/12 1525  Clinical Encounter Type  Visited With Patient and family together  Visit Type Initial;Spiritual support;Critical Care  Referral From Nurse  Spiritual Encounters  Spiritual Needs Emotional  Stress Factors  Patient Stress Factors Health changes;Other (Comment) (Heart Pain)  Family Stress Factors Major life changes

## 2012-11-15 NOTE — Progress Notes (Signed)
ANTICOAGULATION CONSULT NOTE - Follow Up Consult  Pharmacy Consult for heparin, and warfarin Indication: atrial fibrillation and CAD  Labs:  Recent Labs  11/12/12 1116 11/12/12 1513 11/12/12 1951  11/13/12 0133 11/13/12 1217 11/14/12 0518 11/15/12 0500  HGB 14.6  --   --   --  12.8*  --  13.6 13.6  HCT 42.6  --   --   --  36.5*  --  38.0* 39.7  PLT 193  --   --   --  174  --  163 162  APTT 28  --   --   --   --   --   --   --   LABPROT 15.5*  --   --   --   --   --   --  13.3  INR 1.26  --   --   --   --   --   --  1.03  HEPARINUNFRC  --   --   --   < > 0.22* 0.42 0.58 0.57  CREATININE 1.25  --   --   --  1.28  --  1.31  --   TROPONINI <0.30 2.38* 4.66*  --  5.09*  --   --   --   < > = values in this interval not displayed.  Admit Complaint: 79 y.o.  male  admitted 11/12/2012 with CP.  Pharmacy consulted to dose heparin and warfarin.  PMH: Lives at home alone, Afib (had stopped warfarin PTA), CAD  Overnight Events: 11/15/2012, resuming warfarin,   Assessment: Anticoagulation: Afib, VTE Prophylaxis, IV heparin h/h stable no bleeding noted, warfarin; heparin level at goal, baseline INR 1.03  Warfarin PTA some combination of warfarin 1mg  and 4 mg tablets, per patient had stopped taking it PTA, note patient had 5 partially full bottles of warfarin 1 and 4 mg tablets refilled intermittently over the last year.    Cardiovascular: CAD, hyperlipidemia, AS: asa, atorvastatin, furosemide, metop, trandolopril, stopping plavix Current Weight: 150 lb 12.7 oz (68.4 kg); BP at goal HR 100-60s  GI / Nutrition: Protonix  Nephrology/Urology/Electrolytes/Optho: CrCl 38  PTA Medication Issues: All Home Meds Ordered:   Best Practices: VTE Prophylaxis:  Heparin/warfarin  Goal of Therapy:  Heparin level 0.3-0.7 units/ml INR 2-3   Plan:  Continue heparin at current rate Warfarin 4 mg daily, if INR not up tomorrow will increase dose. Daily heparin level, CBC, INR   Thank you for allowing  pharmacy to be a part of this patients care team.  Lovenia Kim Pharm.D., BCPS Clinical Pharmacist 11/15/2012 8:38 AM Pager: [phone] Phone: [phone]

## 2012-11-15 NOTE — Progress Notes (Signed)
    Subjective:  Feels ok today. No CP, dyspnea, or palps. Just walked with PT and did well.  Objective:  Vital Signs in the last 24 hours: Temp:  [97.2 F (36.2 C)-98.8 F (37.1 C)] 97.7 F (36.5 C) (08/05 0811) Pulse Rate:  [67-104] 67 (08/05 0811) Resp:  [20-22] 22 (08/05 0811) BP: (98-137)/(68-93) 137/93 mmHg (08/05 0811) SpO2:  [95 %-98 %] 98 % (08/05 0811)  Intake/Output from previous day: 08/04 0701 - 08/05 0700 In: 456 [I.V.:456] Out: 850 [Urine:850]  Physical Exam: Pt is alert and oriented, elderly male in NAD HEENT: normal Neck: JVP - normal Lungs: CTA bilaterally CV: irregular with grade 2/6 systolic murmur at the LSB Abd: soft, NT, Positive BS, no hepatomegaly Ext: no C/C/E Skin: warm/dry no rash   Lab Results:  Recent Labs  11/14/12 0518 11/15/12 0500  WBC 9.1 8.9  HGB 13.6 13.6  PLT 163 162    Recent Labs  11/13/12 0133 11/14/12 0518  NA 138 142  K 4.5 4.0  CL 102 105  CO2 29 27  GLUCOSE 105* 95  BUN 20 22  CREATININE 1.28 1.31    Recent Labs  11/12/12 1951 11/13/12 0133  TROPONINI 4.66* 5.09*    Cardiac Studies: 2D Echo: Study Conclusions  - Left ventricle: The cavity size was normal. Wall thickness was increased in a pattern of mild LVH. Systolic function was normal. The estimated ejection fraction was in the range of 60% to 65%. Wall motion was normal; there were no regional wall motion abnormalities. Indeterminant diastolic function (atrial fibrillation). - Aortic valve: Trileaflet; moderately calcified leaflets. Trivial regurgitation. Mild AS by mean gradient, severe by calculated valve area. Suspect moderate visually. Mean gradient: 16mm Hg (S). Peak gradient: 30mm Hg (S). Valve area: 0.8cm^2(VTI). - Mitral valve: Mild regurgitation. - Left atrium: The atrium was moderately dilated. - Right ventricle: The cavity size was mildly dilated. Systolic function was normal. - Right atrium: The atrium was moderately  dilated. - Tricuspid valve: Mild-moderate regurgitation. Peak RV-RA gradient: 30mm Hg (S). - Pulmonary arteries: PA peak pressure: 40mm Hg (S). - Systemic veins: IVC measured 1.9 cm with some respirophasic variation, suggesting RA pressure 10 mmHg. Impressions:  - The patient was in atrial fibrillation. Normal LV size with mild LV hypertrophy. EF 60-65%. Mildly dilated RV with normal systolic function. Aortic stenosis was probably moderate (mild by gradient, severe by calculated valve area, visually moderate range). If there is a question of severity, would consider TEE. Mild mitral regurgitation, mild to moderate TR, mild pulmonary hypertension.  Tele: Personally reviewed. Atrial fib, 2.1 s pauses, short run of NSVT  Assessment/Plan:  1. Inferior MI - no clear culprit at cath. Question embolic event in patient with AFib who stopped anticoagulation versus plaque rupture. Agree resume warfarin, low-dose ASA, no plavix. Echo shows normal LV function and no wall motion abnormalities. Continue statin.  2. Atrial fibrillation, permanent. Resume warfarin. Continue beta-blocker. Rate is controlled, pauses about 2 sec.  3. Moderate AS - observation  4. Dispo - working with case Production designer, theatre/television/film and family to determine home with assistance/support versus SNF. He is stable on feet per PT. One fall reported 6 weeks ago. Main issue is med compliance and needs some supervision.   Tonny Bollman, M.D. 11/15/2012, 8:22 AM

## 2012-11-16 LAB — HEPARIN LEVEL (UNFRACTIONATED): Heparin Unfractionated: 0.65 IU/mL (ref 0.30–0.70)

## 2012-11-16 LAB — CBC
Hemoglobin: 13.8 g/dL (ref 13.0–17.0)
MCHC: 34.9 g/dL (ref 30.0–36.0)
RDW: 14.3 % (ref 11.5–15.5)
WBC: 10.7 10*3/uL — ABNORMAL HIGH (ref 4.0–10.5)

## 2012-11-16 LAB — PROTIME-INR
INR: 1.23 (ref 0.00–1.49)
Prothrombin Time: 15.2 seconds (ref 11.6–15.2)

## 2012-11-16 MED ORDER — WARFARIN SODIUM 4 MG PO TABS
4.0000 mg | ORAL_TABLET | Freq: Once | ORAL | Status: AC
  Administered 2012-11-16: 4 mg via ORAL
  Filled 2012-11-16: qty 1

## 2012-11-16 NOTE — Clinical Social Work Note (Signed)
Clinical Social Worker met with patient's daughters and spoke about discharge plans of short term SNF vs ALF. Daughters are agreeable for CSW to initiate SNF search in [facility] with preference for [facility] for short term placement. Daughters also expressed option of ALF, long term placement after rehab. CSW encouraged daughters to think of second option just in case preferred facility is not available and daughters understood. Daughter expressed that they did not want a referral sent to [facility]. CSW will complete referral and update patient and family when bed offers are made.   Aaron [name] MSW, [employer] [phone]

## 2012-11-16 NOTE — Clinical Social Work Placement (Addendum)
Clinical Social Work Department CLINICAL SOCIAL WORK PLACEMENT NOTE 11/16/2012  Patient:  [name], [name]  Account Number:  [id number] Admit date:  11/12/2012  Clinical Social Worker:  Hulan Fray  Date/time:  11/16/2012 12:28 PM  Clinical Social Work is seeking post-discharge placement for this patient at the following level of care:   SKILLED NURSING   (*CSW will update this form in Epic as items are completed)   11/16/2012  Patient/family provided with [facility] Department of Clinical Social Work's list of facilities offering this level of care within the geographic area requested by the patient (or if unable, by the patient's family).  11/16/2012  Patient/family informed of their freedom to choose among providers that offer the needed level of care, that participate in Medicare, Medicaid or managed care program needed by the patient, have an available bed and are willing to accept the patient.  11/16/2012  Patient/family informed of [facility]' ownership interest in [facility], as well as of the fact that they are under no obligation to receive care at this facility.  PASARR submitted to EDS on 11/16/2012 PASARR number received from EDS on 11/16/2012  FL2 transmitted to all facilities in geographic area requested by pt/family on  11/16/2012 FL2 transmitted to all facilities within larger geographic area on   Patient informed that his/her managed care company has contracts with or will negotiate with  certain facilities, including the following:     Patient/family informed of bed offers received:  11-16-12 Patient chooses bed at [facility] Physician recommends and patient chooses bed at    Patient to be transferred to [facility]  on 11/21/2012  Patient to be transferred to facility by family  The following physician request were entered in Epic:   Additional Comments:

## 2012-11-16 NOTE — Progress Notes (Signed)
Transferred to 3013 by wheelchair , stable, belongings with pt. Report given to RN.

## 2012-11-16 NOTE — Progress Notes (Signed)
ANTICOAGULATION CONSULT NOTE - Follow Up Consult  Pharmacy Consult for heparin, and warfarin Indication: atrial fibrillation and CAD  Labs:  Recent Labs  11/14/12 0518 11/15/12 0500 11/16/12 0540  HGB 13.6 13.6 13.8  HCT 38.0* 39.7 39.5  PLT 163 162 149*  LABPROT  --  13.3 15.2  INR  --  1.03 1.23  HEPARINUNFRC 0.58 0.57 0.65  CREATININE 1.31  --   --     Admit Complaint: 79 y.o.  male  admitted 11/12/2012 with CP.  Pharmacy consulted to dose heparin and warfarin.  PMH: Lives at home alone, Afib (had stopped warfarin PTA), CAD Warfarin PTA some combination of warfarin 1mg  and 4 mg tablets, per patient had stopped taking it PTA, note patient had 5 partially full bottles of warfarin 1 and 4 mg tablets refilled intermittently over the last year.    Overnight Events: 11/16/2012, to transfer to tele, to dc home with supervision to maintain medical compliance.   Assessment: Anticoagulation: Afib, VTE Prophylaxis, IV heparin h/h stable no bleeding noted, warfarin; heparin level at goal, baseline INR rising    Cardiovascular: CAD, hyperlipidemia, AS: asa, atorvastatin, furosemide, metop, trandolopril, stopping plavix Current Weight: 144 lb 1.6 oz (65.363 kg); BP at goal HR 80s  GI / Nutrition: Protonix  Nephrology/Urology/Electrolytes/Optho: CrCl 38  PTA Medication Issues: All Home Meds Ordered:   Best Practices: VTE Prophylaxis:  Heparin/warfarin  Goal of Therapy:  Heparin level 0.3-0.7 units/ml INR 2-3   Plan:  Continue heparin at 900 units/hr Warfarin 4 mg daily Daily heparin level, CBC, INR  Thank you for allowing pharmacy to be a part of this patients care team.  Lovenia Kim Pharm.D., BCPS Clinical Pharmacist 11/16/2012 9:42 AM Pager: [phone] Phone: [phone]

## 2012-11-16 NOTE — Progress Notes (Signed)
Patient Name: Aaron [name] Date of Encounter: 11/16/2012   Principal Problem:   Acute inferior myocardial infarction Active Problems:   CAD, NATIVE VESSEL   Atrial fibrillation   Memory loss   HYPERCHOLESTEROLEMIA  IIA   AORTIC STENOSIS/ INSUFFICIENCY, NON-RHEUMATIC   SUBJECTIVE  No chest pain or sob.    CURRENT MEDS . aspirin  81 mg Oral Daily  . atorvastatin  80 mg Oral q1800  . docusate sodium  100 mg Oral BID  . furosemide  20 mg Oral Daily  . metoprolol succinate  25 mg Oral Daily  . pantoprazole  40 mg Oral Daily  . trandolapril  2 mg Oral Daily  . Warfarin - Pharmacist Dosing Inpatient   Does not apply q1800    OBJECTIVE  Filed Vitals:   11/15/12 1700 11/15/12 1930 11/15/12 2337 11/16/12 0340  BP: 113/64 102/56 102/57 114/83  Pulse: 80 82 74   Temp:  98 F (36.7 C) 97.6 F (36.4 C) 97.9 F (36.6 C)  TempSrc:  Oral Oral Oral  Resp: 16 18 16    Height:      Weight:    144 lb 1.6 oz (65.363 kg)  SpO2: 98% 97% 97% 98%    Intake/Output Summary (Last 24 hours) at 11/16/12 0654 Last data filed at 11/16/12 0600  Gross per 24 hour  Intake    887 ml  Output   1450 ml  Net   -563 ml   Filed Weights   11/12/12 1057 11/12/12 1500 11/16/12 0340  Weight: 155 lb (70.308 kg) 150 lb 12.7 oz (68.4 kg) 144 lb 1.6 oz (65.363 kg)   PHYSICAL EXAM  General: Pleasant, NAD. Neuro: Alert and oriented X 3. Moves all extremities spontaneously. Psych: Normal affect. HEENT:  Normal  Neck: Supple without bruits or JVD. Lungs:  Resp regular and unlabored, CTA. Heart: IR, IR no s3, s4, 2/6 sem rusb. Abdomen: Soft, non-tender, non-distended, BS + x 4.  Extremities: No clubbing, cyanosis or edema. DP/PT/Radials 2+ and equal bilaterally. R groin cath site ecchymotic w/o bleeding, bruit, hematoma.  Accessory Clinical Findings  CBC  Recent Labs  11/15/12 0500 11/16/12 0540  WBC 8.9 10.7*  HGB 13.6 13.8  HCT 39.7 39.5  MCV 101.0* 100.3*  PLT 162 149*   Basic  Metabolic Panel  Recent Labs  11/14/12 0518  NA 142  K 4.0  CL 105  CO2 27  GLUCOSE 95  BUN 22  CREATININE 1.31  CALCIUM 8.3*   Lab Results  Component Value Date   INR 1.23 11/16/2012   INR 1.03 11/15/2012   INR 1.26 11/12/2012    TELE  Afib, 70's to 90's, frequent 1.5-2 sec pauses.  Radiology/Studies  Dg Chest Portable 1 View  11/12/2012   *RADIOLOGY REPORT*  Clinical Data: Chest pain.  PORTABLE CHEST - 1 VIEW  Comparison: 12/06/2008  Findings: Prior CABG.  Heart is borderline in size.  Tortuosity and ectasia of the thoracic aorta.  No confluent opacities or effusions.  No acute bony abnormality.  IMPRESSION: Borderline heart size.  No acute cardiopulmonary disease.   Original Report Authenticated By: Charlett Nose, M.D.   ASSESSMENT AND PLAN  1.  Acute Inf STEMI/CAD: s/p cath.  No culprit on cath.  Cont med Rx.  ? Embolic event related to afib.  Cont asa, coumadin, heparin (INR 1.23), statin, bb.  No P2Y12 inhibitor 2/2 need for coumadin.  2.  Afib:  Permanent.  Cont coumadin/heparin.  Rate controlled on bb.  3.  Mod AS:  Follow.  Nl EF by echo.  4.  Deconditioning/noncompliance:  PT recs assisted living.  Will ask CM/SW to look into.  Signed, Nicolasa Ducking NP Patient seen and examined and history reviewed. Agree with above findings and plan. Patient pleasant. Ambulated with PT yesterday. Afib rate controlled and he is asymptomatic. Still on IV heparin until INR therapeutic on coumadin. INR 1.23 today. OK to transfer to telemetry today. Agree he needs some supervision to maintain medical compliance. CSW/case management conference today with family to discuss options.  Theron Arista [facility] 11/16/2012 8:33 AM

## 2012-11-17 LAB — CBC
MCH: 35.1 pg — ABNORMAL HIGH (ref 26.0–34.0)
MCHC: 35.4 g/dL (ref 30.0–36.0)
Platelets: 144 10*3/uL — ABNORMAL LOW (ref 150–400)
RBC: 3.87 MIL/uL — ABNORMAL LOW (ref 4.22–5.81)

## 2012-11-17 LAB — URINALYSIS, ROUTINE W REFLEX MICROSCOPIC
Bilirubin Urine: NEGATIVE
Hgb urine dipstick: NEGATIVE
Protein, ur: NEGATIVE mg/dL
Urobilinogen, UA: 1 mg/dL (ref 0.0–1.0)

## 2012-11-17 LAB — HEPARIN LEVEL (UNFRACTIONATED): Heparin Unfractionated: 0.45 IU/mL (ref 0.30–0.70)

## 2012-11-17 LAB — PROTIME-INR
INR: 1.55 — ABNORMAL HIGH (ref 0.00–1.49)
Prothrombin Time: 18.2 seconds — ABNORMAL HIGH (ref 11.6–15.2)

## 2012-11-17 MED ORDER — WARFARIN SODIUM 4 MG PO TABS
4.0000 mg | ORAL_TABLET | Freq: Every day | ORAL | Status: DC
  Administered 2012-11-17 – 2012-11-18 (×2): 4 mg via ORAL
  Filled 2012-11-17 (×3): qty 1

## 2012-11-17 MED ORDER — WARFARIN SODIUM 4 MG PO TABS
4.0000 mg | ORAL_TABLET | Freq: Once | ORAL | Status: DC
  Filled 2012-11-17: qty 1

## 2012-11-17 NOTE — Progress Notes (Signed)
Physical Therapy Treatment Patient Details Name: Aaron [name] MRN: [id number] DOB: 22-Nov-1923 Today's Date: 11/17/2012 Time: 4098-1191 PT Time Calculation (min): 32 min  PT Assessment / Plan / Recommendation  History of Present Illness 79 y/o admitted with acute inferior STEMI and a-fib.   PT Comments   Progressing with independence, balance training this date.  Agree with SNF level rehab, not safe to be alone with memory deficits.  Follow Up Recommendations  Supervision - Intermittent           Equipment Recommendations  None recommended by PT    Recommendations for Other Services  None  Frequency Min 2X/week   Progress towards PT Goals Progress towards PT goals: Progressing toward goals  Plan Current plan remains appropriate    Precautions / Restrictions Precautions Precautions: Fall   Pertinent Vitals/Pain Denies pain    Mobility  Bed Mobility Bed Mobility: Not assessed Details for Bed Mobility Assistance: up in recliner Transfers Sit to Stand: 5: Supervision;From chair/3-in-1;With upper extremity assist Stand to Sit: 5: Supervision;To chair/3-in-1;With upper extremity assist Details for Transfer Assistance: assist for safety due to lines and pt slightly impulsive Ambulation/Gait Ambulation/Gait Assistance: 5: Supervision;4: Min guard Ambulation Distance (Feet): 300 Feet Assistive device: None Ambulation/Gait Assistance Details: anterior bias due to poor posterior control Gait Pattern: Wide base of support;Trunk flexed;Shuffle Stairs: Yes Stairs Assistance: 5: Supervision Stair Management Technique: One rail Right;Step to pattern Number of Stairs: 8      PT Goals (current goals can now be found in the care plan section)    Visit Information  Last PT Received On: 11/17/12 Assistance Needed: +1 History of Present Illness: 79 y/o admitted with acute inferior STEMI and a-fib.    Subjective Data      Cognition  Cognition Arousal/Alertness:  Awake/alert Behavior During Therapy: WFL for tasks assessed/performed Overall Cognitive Status: Impaired/Different from baseline Area of Impairment: Memory Memory: Decreased short-term memory General Comments: could not recall exactly why here, repeated questions about who I work for.    Balance  High Level Balance High Level Balance Activites: Backward walking High Level Balance Comments: toe and heel walking forward and back, marching forward and back, wall bumps with cues for improved balance recovery/limits of stability  End of Session PT - End of Session Equipment Utilized During Treatment: Gait belt Activity Tolerance: Patient tolerated treatment well Patient left: in chair;with call bell/phone within reach;with chair alarm set   GP     [facility] 11/17/2012, 12:30 PM Sheran Lawless, PT [phone] 11/17/2012

## 2012-11-17 NOTE — Progress Notes (Signed)
Patient Name: Aaron [name] Date of Encounter: 11/17/2012   Principal Problem:   Acute inferior myocardial infarction Active Problems:   HYPERCHOLESTEROLEMIA  IIA   CAD, NATIVE VESSEL   AORTIC STENOSIS/ INSUFFICIENCY, NON-RHEUMATIC   Atrial fibrillation   Memory loss   SUBJECTIVE  No chest pain or sob.    CURRENT MEDS . aspirin  81 mg Oral Daily  . atorvastatin  80 mg Oral q1800  . docusate sodium  100 mg Oral BID  . furosemide  20 mg Oral Daily  . metoprolol succinate  25 mg Oral Daily  . pantoprazole  40 mg Oral Daily  . trandolapril  2 mg Oral Daily  . Warfarin - Pharmacist Dosing Inpatient   Does not apply q1800    OBJECTIVE  Filed Vitals:   11/16/12 1210 11/16/12 1352 11/16/12 2100 11/17/12 0450  BP: 109/63 104/72 125/75 118/79  Pulse: 90 79 76 96  Temp: 97.8 F (36.6 C) 98.7 F (37.1 C) 98.3 F (36.8 C) 98.2 F (36.8 C)  TempSrc: Oral Oral Oral Oral  Resp: 19 18 18 18   Height:      Weight:    146 lb 9.6 oz (66.497 kg)  SpO2: 98% 98% 98% 97%    Intake/Output Summary (Last 24 hours) at 11/17/12 0750 Last data filed at 11/17/12 0600  Gross per 24 hour  Intake    997 ml  Output    850 ml  Net    147 ml   Filed Weights   11/12/12 1500 11/16/12 0340 11/17/12 0450  Weight: 150 lb 12.7 oz (68.4 kg) 144 lb 1.6 oz (65.363 kg) 146 lb 9.6 oz (66.497 kg)   PHYSICAL EXAM  General: Pleasant, NAD. Neuro: Alert and oriented X 3. Moves all extremities spontaneously. Psych: Normal affect. HEENT:  Normal  Neck: Supple without bruits or JVD. Lungs:  Resp regular and unlabored, CTA. Heart: IR, IR no s3, s4, 2/6 sem rusb. Abdomen: Soft, non-tender, non-distended, BS + x 4.  Extremities: No clubbing, cyanosis or edema. DP/PT/Radials 2+ and equal bilaterally. R groin cath site ecchymotic w/o bleeding, bruit, hematoma.  Accessory Clinical Findings  CBC  Recent Labs  11/16/12 0540 11/17/12 0440  WBC 10.7* 11.8*  HGB 13.8 13.6  HCT 39.5 38.4*  MCV  100.3* 99.2  PLT 149* 144*   Basic Metabolic Panel No results found for this basename: NA, K, CL, CO2, GLUCOSE, BUN, CREATININE, CALCIUM, MG, PHOS,  in the last 72 hours Lab Results  Component Value Date   INR 1.55* 11/17/2012   INR 1.23 11/16/2012   INR 1.03 11/15/2012    TELE  Afib, 70's to 90's, frequent 1.5-2 sec pauses.  Radiology/Studies  Dg Chest Portable 1 View  11/12/2012   *RADIOLOGY REPORT*  Clinical Data: Chest pain.  PORTABLE CHEST - 1 VIEW  Comparison: 12/06/2008  Findings: Prior CABG.  Heart is borderline in size.  Tortuosity and ectasia of the thoracic aorta.  No confluent opacities or effusions.  No acute bony abnormality.  IMPRESSION: Borderline heart size.  No acute cardiopulmonary disease.   Original Report Authenticated By: Charlett Nose, M.D.   ASSESSMENT AND PLAN  1.  Acute Inf STEMI/CAD: s/p cath.  No culprit on cath.  Cont med Rx.  ? Embolic event related to afib.  Cont asa, coumadin, heparin (INR 1.55), statin, bb.  No P2Y12 inhibitor 2/2 need for coumadin.  2.  Afib:  Permanent.  Cont coumadin/heparin.  Rate controlled on bb. Will DC heparin when INR  therapeutic.  3.  Mod AS:  Follow.  Nl EF by echo.  4.  Deconditioning/noncompliance: Plan for SNF/Rehab at [place]. Will be ready once INR therapeutic. FL2 signed in paper chart.  5. Elevated WBC. No fever. No symptoms. Will check UA.  Theron Arista [facility] 11/17/2012 7:50 AM

## 2012-11-17 NOTE — Clinical Social Work Note (Signed)
Clinical Social Worker updated family on bed offers received and family chose [facility]. CSW updated [facility] unit CSW on discharge plans.   Rozetta Nunnery MSW, [employer] [phone]

## 2012-11-17 NOTE — Progress Notes (Addendum)
ANTICOAGULATION CONSULT NOTE - Follow Up Consult  Pharmacy Consult for heparin, and warfarin Indication: atrial fibrillation and CAD  Labs:  Recent Labs  11/15/12 0500 11/16/12 0540 11/17/12 0440  HGB 13.6 13.8 13.6  HCT 39.7 39.5 38.4*  PLT 162 149* 144*  LABPROT 13.3 15.2 18.2*  INR 1.03 1.23 1.55*  HEPARINUNFRC 0.57 0.65 0.45    Admit Complaint: 80 y.o.  male  admitted 11/12/2012 with CP.  Pharmacy consulted to dose heparin and warfarin.  PMH: Lives at home alone, Afib (had stopped warfarin PTA), CAD Warfarin PTA: some combination of warfarin 1mg  and 4 mg tablets, per patient had stopped taking it PTA.  Note that patient had 5 partially full bottles of warfarin 1 and 4 mg tablets refilled intermittently over the last year.    Overnight Events: 11/17/2012, tx to tele yesterday with plans for eventual dc home with supervision to maintain medical compliance.    Assessment: Heparin level at goal with heparin at 900 units/hr, INR remains subtherapeutic but increasing towards goal. Hgb stable, plt slightly decreased -- 193>>144 since heparin initiation.  No bleeding/VTE complications noted.  Current Weight: 146 lb 9.6 oz (66.497 kg)  Goal of Therapy:  Heparin level 0.3-0.7 units/ml INR 2-3   Plan:  - Continue heparin at 900 units/hr - Warfarin 4 mg po daily at 1800 - Daily heparin level, CBC, INR  Thank you for allowing pharmacy to be a part of this patients care team.  Marchelle Folks. Illene Bolus, PharmD, BCPS Clinical Pharmacist Pager: [phone] Pharmacy: [phone] 11/17/2012 9:49 AM

## 2012-11-18 LAB — CBC
HCT: 37.8 % — ABNORMAL LOW (ref 39.0–52.0)
MCH: 34.6 pg — ABNORMAL HIGH (ref 26.0–34.0)
MCHC: 34.7 g/dL (ref 30.0–36.0)
MCV: 99.7 fL (ref 78.0–100.0)
RDW: 14.5 % (ref 11.5–15.5)

## 2012-11-18 NOTE — Progress Notes (Signed)
Patient Name: Aaron [name] Date of Encounter: 11/18/2012   Principal Problem:   Acute inferior myocardial infarction Active Problems:   HYPERCHOLESTEROLEMIA  IIA   CAD, NATIVE VESSEL   AORTIC STENOSIS/ INSUFFICIENCY, NON-RHEUMATIC   Atrial fibrillation   Memory loss   SUBJECTIVE  No chest pain or sob.    CURRENT MEDS . aspirin  81 mg Oral Daily  . atorvastatin  80 mg Oral q1800  . docusate sodium  100 mg Oral BID  . furosemide  20 mg Oral Daily  . metoprolol succinate  25 mg Oral Daily  . pantoprazole  40 mg Oral Daily  . trandolapril  2 mg Oral Daily  . warfarin  4 mg Oral q1800  . Warfarin - Pharmacist Dosing Inpatient   Does not apply q1800    OBJECTIVE  Filed Vitals:   11/17/12 1019 11/17/12 1325 11/17/12 2156 11/18/12 0502  BP: 111/62 90/53 110/68 96/59  Pulse: 95 99 98 89  Temp:  97.5 F (36.4 C) 97.4 F (36.3 C) 98.1 F (36.7 C)  TempSrc:   Oral Oral  Resp:  17 18 18   Height:      Weight:      SpO2:  96% 98% 97%    Intake/Output Summary (Last 24 hours) at 11/18/12 0758 Last data filed at 11/18/12 0600  Gross per 24 hour  Intake   1176 ml  Output    550 ml  Net    626 ml   Filed Weights   11/12/12 1500 11/16/12 0340 11/17/12 0450  Weight: 150 lb 12.7 oz (68.4 kg) 144 lb 1.6 oz (65.363 kg) 146 lb 9.6 oz (66.497 kg)   PHYSICAL EXAM  General: Pleasant, NAD. Neuro: Alert and oriented X 2. Moves all extremities spontaneously. Psych: Normal affect. HEENT:  Normal  Neck: Supple without bruits or JVD. Lungs:  Resp regular and unlabored, CTA. Heart: IR, IR no s3, s4, 2/6 sem rusb. Abdomen: Soft, non-tender, non-distended, BS + x 4.  Extremities: No clubbing, cyanosis or edema. DP/PT/Radials 2+ and equal bilaterally. R groin cath site ecchymotic w/o bleeding, bruit, hematoma.  Accessory Clinical Findings  CBC  Recent Labs  11/17/12 0440 11/18/12 0553  WBC 11.8* 8.8  HGB 13.6 13.1  HCT 38.4* 37.8*  MCV 99.2 99.7  PLT 144* 137*    Basic Metabolic Panel No results found for this basename: NA, K, CL, CO2, GLUCOSE, BUN, CREATININE, CALCIUM, MG, PHOS,  in the last 72 hours Lab Results  Component Value Date   INR 1.93* 11/18/2012   INR 1.55* 11/17/2012   INR 1.23 11/16/2012    TELE  Afib, rate well controlled.  Radiology/Studies  Dg Chest Portable 1 View  11/12/2012   *RADIOLOGY REPORT*  Clinical Data: Chest pain.  PORTABLE CHEST - 1 VIEW  Comparison: 12/06/2008  Findings: Prior CABG.  Heart is borderline in size.  Tortuosity and ectasia of the thoracic aorta.  No confluent opacities or effusions.  No acute bony abnormality.  IMPRESSION: Borderline heart size.  No acute cardiopulmonary disease.   Original Report Authenticated By: Charlett Nose, M.D.   ASSESSMENT AND PLAN  1.  Acute Inf STEMI/CAD: s/p cath.  No culprit on cath.  Cont med Rx.  ? Embolic event related to afib.  Cont asa, coumadin, heparin (INR 1.93), statin, bb.  No P2Y12 inhibitor 2/2 need for coumadin.  2.  Afib:  Permanent.  Cont coumadin/heparin.  Rate controlled on bb. Will DC heparin when INR therapeutic, hopefully tomorrow.  3.  Mod AS:  Follow.  Nl EF by echo.  4.  Dementia/noncompliance: Plan for SNF/Rehab at DC. Family has selected [facility]. Will be ready once INR therapeutic. FL2 signed in paper chart.  5. Elevated WBC. No fever. UA normal. WBC back to normal today.  Theron Arista [facility] 11/18/2012 7:58 AM

## 2012-11-18 NOTE — Progress Notes (Signed)
ANTICOAGULATION CONSULT NOTE - Follow Up Consult  Pharmacy Consult for Heparin/Warfarin Indication: Afib, CAD  Allergies  Allergen Reactions  . Penicillins     Patient Measurements: Height: 5\' 8"  (172.7 cm) Weight: 146 lb 9.6 oz (66.497 kg) IBW/kg (Calculated) : 68.4  Vital Signs: Temp: 98.1 F (36.7 C) (08/08 0502) Temp src: Oral (08/08 0502) BP: 96/59 mmHg (08/08 0502) Pulse Rate: 89 (08/08 0502)  Labs:  Recent Labs  11/16/12 0540 11/17/12 0440 11/18/12 0553  HGB 13.8 13.6 13.1  HCT 39.5 38.4* 37.8*  PLT 149* 144* 137*  LABPROT 15.2 18.2* 21.5*  INR 1.23 1.55* 1.93*  HEPARINUNFRC 0.65 0.45 0.43    Estimated Creatinine Clearance: 37.4 ml/min (by C-G formula based on Cr of 1.31).   Medications:  Heparin at 900 units/hr  Assessment: 79 y/o M on heparin/warfarin for Afib/CAD. HL this AM is 0.43. INR is rising at 1.93<1.55<1.23<1.03. Likely to DC heparin tomorrow if INR is >2. CBC stable. CrCl ~ 30-40. No overt bleeding noted.   Goal of Therapy:  INR 2-3 Heparin level 0.3-0.7 units/ml Monitor platelets by anticoagulation protocol: Yes   Plan:  -Continue heparin drip at 900 units/hr -Continue warfarin 4 mg daily -Daily PT/INR, CBC, HL -Monitor for bleeding -f/u DC heparin after INR >2  Thank you for allowing me to take part in this patient's care,  Abran Duke, PharmD Clinical Pharmacist Phone: [phone] Pager: [phone] 11/18/2012 8:49 AM

## 2012-11-18 NOTE — Progress Notes (Signed)
CSW spoke with [facility] to confirm pt bed availability. Pt does have a bed if he is medically ready for dc today.   Genette Huertas, LCSWA [phone]

## 2012-11-18 NOTE — Progress Notes (Signed)
Ambulated in hallway.  Patient stated he feels fine but on the way back to room hr increased to 140's nonsustained and then sustained in 120's.  Patient is assymptomatic. VS WNL, other than increased heart rate

## 2012-11-19 LAB — CBC
HCT: 39 % (ref 39.0–52.0)
Hemoglobin: 13.9 g/dL (ref 13.0–17.0)
MCH: 35.5 pg — ABNORMAL HIGH (ref 26.0–34.0)
MCHC: 35.6 g/dL (ref 30.0–36.0)
MCV: 99.7 fL (ref 78.0–100.0)

## 2012-11-19 LAB — PROTIME-INR: INR: 1.87 — ABNORMAL HIGH (ref 0.00–1.49)

## 2012-11-19 MED ORDER — METOPROLOL SUCCINATE ER 25 MG PO TB24
37.5000 mg | ORAL_TABLET | Freq: Every day | ORAL | Status: DC
  Administered 2012-11-20 – 2012-11-21 (×2): 37.5 mg via ORAL
  Filled 2012-11-19 (×2): qty 1

## 2012-11-19 MED ORDER — HEPARIN BOLUS VIA INFUSION
3000.0000 [IU] | Freq: Once | INTRAVENOUS | Status: AC
  Administered 2012-11-19: 3000 [IU] via INTRAVENOUS
  Filled 2012-11-19: qty 3000

## 2012-11-19 MED ORDER — WARFARIN SODIUM 5 MG PO TABS
5.0000 mg | ORAL_TABLET | Freq: Once | ORAL | Status: AC
  Administered 2012-11-19: 5 mg via ORAL
  Filled 2012-11-19 (×2): qty 1

## 2012-11-19 NOTE — Progress Notes (Signed)
   SUBJECTIVE: The patient is doing well today.  At this time, he denies chest pain, shortness of breath, or any new concerns.  He remains in atrial fibrillation, rates are relatively well controlled.  Is awaiting placement at [facility] once INR is therapeutic (1.87 today).  CURRENT MEDICATIONS: . aspirin  81 mg Oral Daily  . atorvastatin  80 mg Oral q1800  . docusate sodium  100 mg Oral BID  . furosemide  20 mg Oral Daily  . metoprolol succinate  25 mg Oral Daily  . pantoprazole  40 mg Oral Daily  . trandolapril  2 mg Oral Daily  . warfarin  5 mg Oral ONCE-1800  . Warfarin - Pharmacist Dosing Inpatient   Does not apply q1800   . sodium chloride 10 mL/hr at 11/13/12 1714  . heparin 900 Units/hr (11/19/12 0756)    OBJECTIVE: Physical Exam: Filed Vitals:   11/18/12 1700 11/18/12 2008 11/19/12 0500 11/19/12 1016  BP: 112/72 93/57 107/70 95/62  Pulse: 118 97 91 95  Temp: 98.7 F (37.1 C) 98 F (36.7 C) 97.9 F (36.6 C)   TempSrc: Oral Oral    Resp: 18  18   Height:      Weight:      SpO2: 98% 97% 97%     Intake/Output Summary (Last 24 hours) at 11/19/12 1041 Last data filed at 11/19/12 0806  Gross per 24 hour  Intake    120 ml  Output    400 ml  Net   -280 ml    Telemetry reveals atrial fibrillation with controlled ventricular response  GEN- The patient is elderly appearing, alert and oriented x 3 today.   Head- normocephalic, atraumatic Eyes-  Sclera clear, conjunctiva pink Ears- hearing intact Oropharynx- clear Neck- supple,  Lungs- Clear to ausculation bilaterally, normal work of breathing Heart- irregular rate and rhythm  GI- soft, NT, ND, + BS Extremities- no clubbing, cyanosis, or edema    LABS: CBC:  Recent Labs  11/18/12 0553 11/19/12 0419  WBC 8.8 8.7  HGB 13.1 13.9  HCT 37.8* 39.0  MCV 99.7 99.7  PLT 137* 151    RADIOLOGY: Dg Chest Portable 1 View 11/12/2012   *RADIOLOGY REPORT*  Clinical Data: Chest pain.  PORTABLE CHEST - 1  VIEW  Comparison: 12/06/2008  Findings: Prior CABG.  Heart is borderline in size.  Tortuosity and ectasia of the thoracic aorta.  No confluent opacities or effusions.  No acute bony abnormality.  IMPRESSION: Borderline heart size.  No acute cardiopulmonary disease.   Original Report Authenticated By: Charlett Nose, M.D.    ASSESSMENT AND PLAN:  Principal Problem:   Acute inferior myocardial infarction Active Problems:   HYPERCHOLESTEROLEMIA  IIA   CAD, NATIVE VESSEL   AORTIC STENOSIS/ INSUFFICIENCY, NON-RHEUMATIC   Atrial fibrillation   Memory loss   1. Acute Inf STEMI/CAD: s/p cath. No culprit on cath. Cont med Rx. Felt to possibly represent an embolic event related to afib. Cont asa, coumadin, heparin until INR>2, statin, bb. No P2Y12 inhibitor 2/2 need for coumadin.  2. Afib: Permanent. Cont coumadin/heparin. Rate controlled on bb. Will DC heparin when INR therapeutic, hopefully tomorrow.  3. Mod AS: Follow. Nl EF by echo.  4. Dementia/noncompliance: Plan for SNF/Rehab at [place]. Family has selected [facility]. Will be ready once INR therapeutic. FL2 signed in paper chart.

## 2012-11-19 NOTE — Progress Notes (Addendum)
ANTICOAGULATION CONSULT NOTE - Follow Up Consult  Pharmacy Consult for Heparin/Warfarin Indication: Afib, CAD  Allergies  Allergen Reactions  . Penicillins     Patient Measurements: Height: 5\' 8"  (172.7 cm) Weight: 146 lb 9.6 oz (66.497 kg) IBW/kg (Calculated) : 68.4    Vital Signs: Temp: 97.9 F (36.6 C) (08/09 0500) Temp src: Oral (08/08 2008) BP: 107/70 mmHg (08/09 0500) Pulse Rate: 91 (08/09 0500)  Labs:  Recent Labs  11/17/12 0440 11/18/12 0553 11/19/12 0419  HGB 13.6 13.1 13.9  HCT 38.4* 37.8* 39.0  PLT 144* 137* 151  LABPROT 18.2* 21.5* 21.0*  INR 1.55* 1.93* 1.87*  HEPARINUNFRC 0.45 0.43 <0.10*    Estimated Creatinine Clearance: 37.4 ml/min (by C-G formula based on Cr of 1.31).   Medications:  Scheduled:  . aspirin  81 mg Oral Daily  . atorvastatin  80 mg Oral q1800  . docusate sodium  100 mg Oral BID  . furosemide  20 mg Oral Daily  . metoprolol succinate  25 mg Oral Daily  . pantoprazole  40 mg Oral Daily  . trandolapril  2 mg Oral Daily  . warfarin  4 mg Oral q1800  . Warfarin - Pharmacist Dosing Inpatient   Does not apply q1800    Assessment: 79 yo M on heparin/warfarin for Afib/CAD. HL came back subtherapeutic at <0.10. Pts nurse,stated she found heparin tubing out and restarted pt heparin at 0706. H/H and plt stable. No signs of bleed reported. INR continues to remain sub therapeutic, down from 1.93 to 1.87. Pt on home regimen.  Goal of Therapy:  INR 2-3 Heparin level 0.3-0.7 units/ml Monitor platelets by anticoagulation protocol: Yes   Plan:  1. Give Heparin 3000 unit IV bolus x1 now, f/u 8hr HL 2. Restart Heparin drip at 900 units/hr 3. Give Warfarin 5mg  x1 tonight 4. Monitor INR/HL daily  Forestine Na M 11/19/2012,7:42 AM   Addendum: 8 hour heparin level is therapeutic at 0.38. Will continue heparin at current rate of 900 units/hr and follow up heparin level in the morning.  Louie Casa, PharmD, BCPS 11/19/2012, 5:00PM

## 2012-11-20 LAB — CBC
HCT: 37.7 % — ABNORMAL LOW (ref 39.0–52.0)
MCV: 99 fL (ref 78.0–100.0)
Platelets: 149 10*3/uL — ABNORMAL LOW (ref 150–400)
RBC: 3.81 MIL/uL — ABNORMAL LOW (ref 4.22–5.81)
WBC: 7 10*3/uL (ref 4.0–10.5)

## 2012-11-20 MED ORDER — WARFARIN SODIUM 4 MG PO TABS
4.0000 mg | ORAL_TABLET | Freq: Once | ORAL | Status: AC
  Administered 2012-11-20: 4 mg via ORAL
  Filled 2012-11-20: qty 1

## 2012-11-20 NOTE — Progress Notes (Signed)
   SUBJECTIVE: The patient is doing well today.  At this time, he denies chest pain, shortness of breath, or any new concerns.  He remains in atrial fibrillation, rates are relatively well controlled.  Is awaiting placement at [facility].  CURRENT MEDICATIONS: . aspirin  81 mg Oral Daily  . atorvastatin  80 mg Oral q1800  . docusate sodium  100 mg Oral BID  . furosemide  20 mg Oral Daily  . metoprolol succinate  37.5 mg Oral Daily  . pantoprazole  40 mg Oral Daily  . trandolapril  2 mg Oral Daily  . warfarin  4 mg Oral ONCE-1800  . Warfarin - Pharmacist Dosing Inpatient   Does not apply q1800   . sodium chloride 10 mL/hr at 11/13/12 1714    OBJECTIVE: Physical Exam: Filed Vitals:   11/19/12 1016 11/19/12 1324 11/19/12 2025 11/20/12 0502  BP: 95/62 107/68 105/67 118/75  Pulse: 95 96 88 87  Temp:  98 F (36.7 C) 98.2 F (36.8 C) 97.6 F (36.4 C)  TempSrc:  Oral Oral Oral  Resp:  16 16 16   Height:      Weight:      SpO2:  99% 98% 98%    Intake/Output Summary (Last 24 hours) at 11/20/12 0920 Last data filed at 11/20/12 0700  Gross per 24 hour  Intake    240 ml  Output    400 ml  Net   -160 ml    Telemetry reveals atrial fibrillation with controlled ventricular response  GEN- The patient is elderly appearing, alert and oriented x 3 today.   Head- normocephalic, atraumatic Eyes-  Sclera clear, conjunctiva pink Ears- hearing intact Oropharynx- clear Neck- supple,  Lungs- Clear to ausculation bilaterally, normal work of breathing Heart- irregular rate and rhythm  GI- soft, NT, ND, + BS Extremities- no clubbing, cyanosis, or edema    LABS: CBC:  Recent Labs  11/19/12 0419 11/20/12 0400  WBC 8.7 7.0  HGB 13.9 13.4  HCT 39.0 37.7*  MCV 99.7 99.0  PLT 151 149*    RADIOLOGY: Dg Chest Portable 1 View 11/12/2012   *RADIOLOGY REPORT*  Clinical Data: Chest pain.  PORTABLE CHEST - 1 VIEW  Comparison: 12/06/2008  Findings: Prior CABG.  Heart is borderline  in size.  Tortuosity and ectasia of the thoracic aorta.  No confluent opacities or effusions.  No acute bony abnormality.  IMPRESSION: Borderline heart size.  No acute cardiopulmonary disease.   Original Report Authenticated By: Charlett Nose, M.D.    ASSESSMENT AND PLAN:  Principal Problem:   Acute inferior myocardial infarction Active Problems:   HYPERCHOLESTEROLEMIA  IIA   CAD, NATIVE VESSEL   AORTIC STENOSIS/ INSUFFICIENCY, NON-RHEUMATIC   Atrial fibrillation   Memory loss   1. Acute Inf STEMI/CAD: s/p cath. No culprit on cath. Cont med Rx. Felt to possibly represent an embolic event related to afib. Cont asa, coumadin, heparin until INR>2, statin, bb. No P2Y12 inhibitor 2/2 need for coumadin.  2. Afib: Permanent. Cont coumadin/heparin. Rate controlled on bb. Will DC heparin today.   3. Mod AS: Follow. Nl EF by echo.  4. Dementia/noncompliance: Plan for SNF/Rehab at [place]. Family has selected [facility]. FL2 signed in paper chart.  Hope to discharge tomorrow am.

## 2012-11-20 NOTE — Progress Notes (Addendum)
CSW called [facility] as patient is medically stable. They can not admit on Sunday because they have no pharmacist and did not receive a med rec on Friday. Patients family would like to transport when patient is medically stable.  Amin Fornwalt C. Clarke Peretz MSW, LCSW [phone]

## 2012-11-20 NOTE — Progress Notes (Signed)
ANTICOAGULATION CONSULT NOTE - Follow Up Consult  Pharmacy Consult for Heparin/Warfarin Indication: Afib, CAD  Allergies  Allergen Reactions  . Penicillins     Patient Measurements: Height: 5\' 8"  (172.7 cm) Weight: 146 lb 9.6 oz (66.497 kg) IBW/kg (Calculated) : 68.4  Vital Signs: Temp: 97.6 F (36.4 C) (08/10 0502) Temp src: Oral (08/10 0502) BP: 118/75 mmHg (08/10 0502) Pulse Rate: 87 (08/10 0502)  Labs:  Recent Labs  11/18/12 0553 11/19/12 0419 11/19/12 1615 11/20/12 0400  HGB 13.1 13.9  --  13.4  HCT 37.8* 39.0  --  37.7*  PLT 137* 151  --  149*  LABPROT 21.5* 21.0*  --  27.6*  INR 1.93* 1.87*  --  2.68*  HEPARINUNFRC 0.43 <0.10* 0.38 0.40    Estimated Creatinine Clearance: 37.4 ml/min (by C-G formula based on Cr of 1.31).  Medications:  Heparin at 900 units/hr  Assessment: 79 y/o M on heparin/warfarin for Afib/CAD. HL this AM is 0.40<0.38 (back to therapeutic x 2 after pulling line out) . Marked jump in INR after increasing dose from 4mg /day to 5mg :  2.68<1.87<1.93<1.55<1.23<1.03. CBC stable. Scr 1.31 with CrCl ~ 30-40. No overt bleeding noted.   Goal of Therapy:  INR 2-3 Heparin level 0.3-0.7 units/ml Monitor platelets by anticoagulation protocol: Yes   Plan:  -Continue heparin drip at 900 units/hr (can DC now that INR is >2, this is the plan per Dr. Jenel Lucks note on 8/9) -Warfarin 4 mg PO x 1 today -Daily PT/INR, CBC, HL -Monitor for bleeding -Will f/u DC heparin today   Thank you for allowing me to take part in this patient's care,  Abran Duke, PharmD Clinical Pharmacist Phone: [phone] Pager: [phone] 11/20/2012 8:47 AM

## 2012-11-21 ENCOUNTER — Inpatient Hospital Stay
Admission: RE | Admit: 2012-11-21 | Discharge: 2012-12-10 | Disposition: A | Discharge status: Home / Self Care | Payer: PRIVATE HEALTH INSURANCE | Source: Ambulatory Visit | Attending: Internal Medicine | Admitting: Internal Medicine

## 2012-11-21 ENCOUNTER — Encounter (HOSPITAL_COMMUNITY): Payer: Self-pay | Admitting: Physician Assistant

## 2012-11-21 ENCOUNTER — Telehealth: Payer: Self-pay | Admitting: Physician Assistant

## 2012-11-21 DIAGNOSIS — I472 Ventricular tachycardia: Secondary | ICD-10-CM

## 2012-11-21 DIAGNOSIS — I4821 Permanent atrial fibrillation: Secondary | ICD-10-CM

## 2012-11-21 DIAGNOSIS — I2119 ST elevation (STEMI) myocardial infarction involving other coronary artery of inferior wall: Secondary | ICD-10-CM

## 2012-11-21 DIAGNOSIS — D72829 Elevated white blood cell count, unspecified: Secondary | ICD-10-CM

## 2012-11-21 DIAGNOSIS — R0902 Hypoxemia: Principal | ICD-10-CM

## 2012-11-21 LAB — PROTIME-INR
INR: 3.48 — ABNORMAL HIGH (ref 0.00–1.49)
Prothrombin Time: 33.7 seconds — ABNORMAL HIGH (ref 11.6–15.2)

## 2012-11-21 LAB — CBC
Hemoglobin: 13.3 g/dL (ref 13.0–17.0)
MCHC: 35.5 g/dL (ref 30.0–36.0)

## 2012-11-21 MED ORDER — NITROGLYCERIN 0.4 MG SL SUBL: 0.4000 mg | SUBLINGUAL_TABLET | SUBLINGUAL | Status: AC | PRN

## 2012-11-21 MED ORDER — TRANDOLAPRIL 2 MG PO TABS: 2.0000 mg | ORAL_TABLET | Freq: Every day | ORAL | Status: DC

## 2012-11-21 MED ORDER — ATORVASTATIN CALCIUM 80 MG PO TABS: 80.0000 mg | ORAL_TABLET | Freq: Every day | ORAL | Status: DC

## 2012-11-21 MED ORDER — FUROSEMIDE 20 MG PO TABS: 20.0000 mg | ORAL_TABLET | Freq: Every day | ORAL | Status: DC

## 2012-11-21 MED ORDER — METOPROLOL SUCCINATE ER 25 MG PO TB24: 37.5000 mg | ORAL_TABLET | Freq: Every day | ORAL | Status: DC

## 2012-11-21 MED ORDER — WARFARIN SODIUM 4 MG PO TABS: ORAL_TABLET | ORAL | Status: DC

## 2012-11-21 MED ORDER — DSS 100 MG PO CAPS: 100.0000 mg | ORAL_CAPSULE | Freq: Two times a day (BID) | ORAL | Status: DC

## 2012-11-21 NOTE — Progress Notes (Signed)
Physical Therapy Treatment Patient Details Name: Aaron [name] MRN: [id number] DOB: 1923/11/10 Today's Date: 11/21/2012 Time: 0102-7253 PT Time Calculation (min): 23 min  PT Assessment / Plan / Recommendation  History of Present Illness 79 y/o admitted with acute inferior STEMI and a-fib.   PT Comments   Pt has progressed well and has met acute PT goals. D/c from acute PT, all further needs can be addressed at SNF in rehab. Pt preparing for d.c today. PT signing off.   Follow Up Recommendations  Supervision - Intermittent;SNF     Does the patient have the potential to tolerate intense rehabilitation     Barriers to Discharge        Equipment Recommendations  None recommended by PT    Recommendations for Other Services    Frequency Min 2X/week   Progress towards PT Goals Progress towards PT goals: Goals met/education completed, patient discharged from PT  Plan Current plan remains appropriate    Precautions / Restrictions Precautions Precautions: Fall Restrictions Weight Bearing Restrictions: No   Pertinent Vitals/Pain VSS    Mobility  Bed Mobility Bed Mobility: Not assessed (had gotten out of bed independently prior to arrival) Supine to Sit: 7: Independent Transfers Transfers: Sit to Stand;Stand to Sit Sit to Stand: 6: Modified independent (Device/Increase time);From chair/3-in-1 Stand to Sit: 6: Modified independent (Device/Increase time);To chair/3-in-1 Details for Transfer Assistance: pt stands safely without physical asisst Ambulation/Gait Ambulation/Gait Assistance: 6: Modified independent (Device/Increase time) Ambulation Distance (Feet): 400 Feet Assistive device: None Ambulation/Gait Assistance Details: pt ambulated with normal gait speed and step length. Worked on posture during ambulation as pt is kyphotic. Practiced changes of pace, quick starts and stops, head turns (vertical and horizontal) with no LOB. Pt has difficulty with exaggerated slow  ambulation due to difficulty balanceing on one leg but shows good insight into being unable to do this Gait Pattern: Step-through pattern;Trunk flexed Gait velocity: wfl for age Stairs: Yes Stairs Assistance: 6: Modified independent (Device/Increase time) Stair Management Technique: One rail Right;Alternating pattern;Forwards Number of Stairs: 12 Wheelchair Mobility Wheelchair Mobility: No    Exercises General Exercises - Lower Extremity Hip ABduction/ADduction: AROM;10 reps;Both;Standing Heel Raises: AROM;10 reps;Standing Mini-Sqauts: AROM;15 reps;Standing Other Exercises Other Exercises: chest stretch in doorway x2 mins   PT Diagnosis:    PT Problem List:   PT Treatment Interventions:     PT Goals (current goals can now be found in the care plan section) Acute Rehab PT Goals Patient Stated Goal: home when able PT Goal Formulation: With patient/family Time For Goal Achievement: 11/22/12 Potential to Achieve Goals: Good  Visit Information  Last PT Received On: 11/21/12 Assistance Needed: +1 History of Present Illness: 79 y/o admitted with acute inferior STEMI and a-fib.    Subjective Data  Subjective: pt preparing for d/c to SNF later today Patient Stated Goal: home when able   Cognition  Cognition Arousal/Alertness: Awake/alert Behavior During Therapy: WFL for tasks assessed/performed Overall Cognitive Status: History of cognitive impairments - at baseline Area of Impairment: Memory Memory: Decreased short-term memory General Comments: pt did recall situation but couldn't remember his daughter's phone number or name and who would be taking him to SNF. Assume that this is baseline for him and he mentions his memory deficits    Balance  Balance Balance Assessed: Yes Dynamic Standing Balance Dynamic Standing - Balance Support: No upper extremity supported;During functional activity Dynamic Standing - Level of Assistance: 6: Modified independent (Device/Increase time)   End of Session PT - End of Session  Equipment Utilized During Treatment: Gait belt Activity Tolerance: Patient tolerated treatment well Patient left: in chair;with call bell/phone within reach Nurse Communication: Mobility status   GP   Lyanne Co, PT  Acute Rehab Services  [phone]   Lyanne Co 11/21/2012, 2:04 PM

## 2012-11-21 NOTE — Discharge Summary (Signed)
Discharge Summary   Patient ID: Aaron [name],  MRN: [id number], DOB/AGE: 77-Apr-1925 77 y.o.  Admit date: 11/12/2012 Discharge date: 11/21/2012  Primary Physician: Colette Ribas, MD Primary Cardiologist: previously T. Riley Kill, MD; admitted by B. Jens Som, MD this hospitalization  Discharge Diagnoses Principal Problem:   ST elevation myocardial infarction (STEMI) of inferior wall  - s/p cardiac cath 11/12/12: occluded ostial LAD, patent LIMA-LAD, patent mid LCx stent, 60-70% prox PDA stenosis, mild ascending aortic dilatation w/o dissection or AV insufficiency  - Unclear culprit lesion- question cardioembolic (a-fib) or plaque lysis  - Medically managed Active Problems:   CAD, NATIVE VESSEL  - As above  - Discharged on ASA, ACEi, BB, statin, NTG SL PRN  - P2Y12 held given Coumadin initiation   Permanent atrial fibrillation  - Discharged on Coumadin- hold today (8/11, INR 3.48), one tablet (4mg ) M, W, F, Sa, Su, one-half tablet (2mg ) Tues, Thursday  - Check INR 8/13  - Fax to [phone]; Attention: Weston Brass, PharmD  - Episodes of RVR with ambulated (rate 120-140)  - Discharged on metoprolol succinate 37.5 mg PO daily for rate-control   AORTIC STENOSIS/ INSUFFICIENCY, NON-RHEUMATIC  - 2D echo 11/14/12: EF 60-65%%, mild MR, moderate AS, moderate LA dilatation, mild RV dilatation, mild-mod TR, PASP , elevated RA pressure  - Moderate per TTE despite severe calcification  - Continued monitoring   Memory loss  - SNF placement for memory deficits, felt to be stable ambulating per PT assessment   HYPERCHOLESTEROLEMIA  IIA  - Switched to atorvastatin 80  - Recommend checking lipid profile, LFTs in 6 weeks   NSVT (nonsustained ventricular tachycardia)  - 18 beat run this admission, asymptomatic  - Discharged on high dose BB   Leukocytosis  - Reactive, post-MI  - No evidence of infection-- clear lungs, u/a w/o evidence of UTI  Allergies Allergies  Allergen Reactions    . Penicillins     Diagnostic Studies/Procedures  PORTABLE CHEST X-RAY - 11/12/12  IMPRESSION:  Borderline heart size. No acute cardiopulmonary disease.  CARDIAC CATHETERIZATION - 11/12/12  HEMODYNAMICS:  AO SYSTOLIC/AO DIASTOLIC: 146/85  ANGIOGRAPHIC RESULTS:  1. Left main; normal  2. LAD; occluded at its origin  3. Left circumflex; widely patent stent in the midportion.  4. Right coronary artery; dominant with a 60-70% segmental proximal PDA stenosis  5.LIMA TO LAD; widely patent  6 Left ventriculography;not performed  7. Supra-valvular aortography: Supravalvular aortogram was performed using 30 cc of contrast at 20 cc per second. The was mildly dilated and "unwell". There is no obvious aortic insufficiency. The arch vessels appeared intact. There is no dissection noted.  TRANSTHORACIC ECHOCARDIOGRAM - 11/14/12   - Left ventricle: The cavity size was normal. Wall thickness was increased in a pattern of mild LVH. Systolic function was normal. The estimated ejection fraction was in the range of 60% to 65%. Wall motion was normal; there were no regional wall motion abnormalities. Indeterminant diastolic function (atrial fibrillation). - Aortic valve: Trileaflet; moderately calcified leaflets. Trivial regurgitation. Mild AS by mean gradient, severe by calculated valve area. Suspect moderate visually. Mean gradient: 16mm Hg (S). Peak gradient: 30mm Hg (S). Valve area: 0.8cm^2(VTI). - Mitral valve: Mild regurgitation. - Left atrium: The atrium was moderately dilated. - Right ventricle: The cavity size was mildly dilated. Systolic function was normal. - Right atrium: The atrium was moderately dilated. - Tricuspid valve: Mild-moderate regurgitation. Peak RV-RA gradient: 30mm Hg (S). - Pulmonary arteries: PA peak pressure: 40mm Hg (S). - Systemic veins:  IVC measured 1.9 cm with some respirophasic variation, suggesting RA pressure 10 mmHg.  Impressions:  The patient was in atrial  fibrillation. Normal LV size with mild LV hypertrophy. EF 60-65%. Mildly dilated RV with normal systolic function. Aortic stenosis was probably moderate (mild by gradient, severe by calculated valve area, visually moderate range). If there is a question of severity, would consider TEE. Mild mitral regurgitation, mild to moderate TR, mild pulmonary hypertension.  History of Present Illness   Aaron [name] is a 79 y.o. male who was admitted to [facility] 11/12/12 with the above problem list.   Mr. Ashmead is 79 yo with PMHx s/f CAD s/p CABG in 1998 (LIMA-LAD), prior PCI-LCx, permanent AF, h/o AS, HLD, HTN, GERD and memory impairment.   He presented to [facility] ED c/o epigastric pain while waling described as a pressure radiating to his left arm w/o associated symptoms. He denied aggravation on inpsiration, position change or meals. The episode lasted ~  30 minutes and resolved.   Hospital Course   In the ED, EKG revealed changed concerning for inferior STEMI. He underwent emergent cardiac catheterization as detailed in full above. There was a 60-70% prox PDA lesion, otherwise no culprit lesion was identified. There was suspicion for a cardioembolic vs plaque lysis etiology. The decision was made to medical manage. He tolerated the procedure well without complications.   He was evaluated post-procedure. CHADSVASc score was > 2. Dr. Jens Som spoke with the patient and his daughters. He had an isolated fall ~ 1 month ago while walking up the stairs felt to be mechanical in nature. The patient's daughters expressed considerable concern regarding the patient's inability to complete independent ADLs and stopping all home medications on his own. Ultimately, the benefits were felt to outweigh the risks, and the patient was started on Coumadin with heparin bridging. Additionally, CSW and case management consults were requested. After several discussions with the patient and daugthers, the plan was  made to discharge to SNF temporarily with eventual transition to an ALF. This was arranged. The extent of the patient's admission thereafter consisted of allowing INR to become therapeutic and for a bed to become available.   2D echocardiogram indicated EF 60-65%%, mild MR, moderate AS, moderate LA dilatation, mild RV dilatation, mild-mod TR, PASP , elevated RA pressure. Post-cath CBC did indicated a mild leukocytosis. There was no evidence of fever. Lungs were clear on CXR and by exam. U/a indicated no evidence of UTI. Additionally, the patient had evidence of NSVT on telemetry (asymptomatic) and a-fib with RVR (HR 120-140s) on ambulating (asymptomatic). Toprol-XL was up-titrated with improvement.   He was evaluated by Dr. [place] today and deemed stable for discharge. He will be discharged to [facility] on the medication regimen outlined below. Note, P2Y12 inhibitor was not initiated given concomitant ASA and Coumadin therapy. Pharmacy was consulted just prior to discharge regarding Coumadin dosing as INR was supratherapeutic at 3.48 today. Recommendations are outlined above/below. A 7-day TCM follow-up appointment has been arranged. PCP follow-up in 1 week has been recommended. This information, including supplemental ACS/CAD and atrial fibrillation material, has been clearly outlined in the discharge AVS.   Discharge Vitals:  Blood pressure 112/67, pulse 85, temperature 97.8 F (36.6 C), temperature source Oral, resp. rate 16, height 5\' 8"  (1.727 m), weight 66.497 kg (146 lb 9.6 oz), SpO2 95.00%.   Labs: Recent Labs     11/20/12  0400  11/21/12  0444  WBC  7.0  6.9  HGB  13.4  13.3  HCT  37.7*  37.5*  MCV  99.0  98.9  PLT  149*  156   Disposition:  Discharge Orders   Future Appointments Provider Department Dept Phone   11/28/2012 11:50 AM Beatrice Lecher, PA-C [facility] Main Office [place]) [phone]   Future Orders Complete By Expires     Diet - low sodium heart  healthy  As directed     Increase activity slowly  As directed           Follow-up Information   Follow up with Tereso Newcomer, PA-C On 11/28/2012. (At 11:50 AM for post-hospital cardiology follow-up. )    Contact information:   [address] [place] [address] [phone]       Follow up with Colette Ribas, MD. Schedule an appointment as soon as possible for a visit in 1 week. (For general post-hospital follow-up. )    Contact information:   [address] [place] [address] [phone]       Discharge Medications:    Medication List    STOP taking these medications       diltiazem 240 MG 24 hr capsule  Commonly known as:  DILACOR XR     rosuvastatin 10 MG tablet  Commonly known as:  CRESTOR      TAKE these medications       aspirin EC 81 MG tablet  Take 81 mg by mouth daily.     atorvastatin 80 MG tablet  Commonly known as:  LIPITOR  Take 1 tablet (80 mg total) by mouth daily at 6 PM.     DSS 100 MG Caps  Take 100 mg by mouth 2 (two) times daily.     furosemide 20 MG tablet  Commonly known as:  LASIX  Take 1 tablet (20 mg total) by mouth daily.     metoprolol succinate 25 MG 24 hr tablet  Commonly known as:  TOPROL-XL  Take 1.5 tablets (37.5 mg total) by mouth daily. Take with or immediately following a meal.     nitroGLYCERIN 0.4 MG SL tablet  Commonly known as:  NITROSTAT  Place 1 tablet (0.4 mg total) under the tongue every 5 (five) minutes as needed for chest pain.     omeprazole 20 MG capsule  Commonly known as:  PRILOSEC  Take 20 mg by mouth 2 (two) times daily.     REFRESH OP  Place 1 drop into the left eye 2 (two) times daily.     trandolapril 2 MG tablet  Commonly known as:  MAVIK  Take 1 tablet (2 mg total) by mouth daily.     warfarin 4 MG tablet  Commonly known as:  COUMADIN  Hold Coumadin today (8/11). Resume tomorrow (8/12). Take 4mg  (one tablet) M, W, F, Sa, Su. Take 2mg  (one  half tablet) Tue, Thurs.       Outstanding Labs/Studies: recommend lipid profile, LFTs in 6 weeks since switching statins  Duration of Discharge Encounter: Greater than 30 minutes including physician time.  Signed, R. [name], PA-C 11/21/2012, 10:15 AM

## 2012-11-21 NOTE — Discharge Summary (Signed)
Patient seen and examined and history reviewed. Agree with above findings and plan. See earlier rounding note.  Thedora Hinders 11/21/2012 2:42 PM

## 2012-11-21 NOTE — Progress Notes (Signed)
[facility]. [facility] picked up and taking to [facility]. Packet given to family to take.

## 2012-11-21 NOTE — Progress Notes (Signed)
Patient Name: SLATE DEBROUX Date of Encounter: 11/21/2012   Principal Problem:   Acute inferior myocardial infarction Active Problems:   HYPERCHOLESTEROLEMIA  IIA   CAD, NATIVE VESSEL   AORTIC STENOSIS/ INSUFFICIENCY, NON-RHEUMATIC   Atrial fibrillation   Memory loss   SUBJECTIVE  No chest pain or sob.  Pleasantly confused.  CURRENT MEDS . aspirin  81 mg Oral Daily  . atorvastatin  80 mg Oral q1800  . docusate sodium  100 mg Oral BID  . furosemide  20 mg Oral Daily  . metoprolol succinate  37.5 mg Oral Daily  . pantoprazole  40 mg Oral Daily  . trandolapril  2 mg Oral Daily  . Warfarin - Pharmacist Dosing Inpatient   Does not apply q1800    OBJECTIVE  Filed Vitals:   11/20/12 1020 11/20/12 1304 11/20/12 2100 11/21/12 0515  BP: 108/66 102/62 106/63 112/67  Pulse: 86 93 88 85  Temp:  97.6 F (36.4 C) 98 F (36.7 C) 97.8 F (36.6 C)  TempSrc:  Oral Oral   Resp:  17 16 16   Height:      Weight:      SpO2:  99% 97% 95%    Intake/Output Summary (Last 24 hours) at 11/21/12 0831 Last data filed at 11/20/12 1828  Gross per 24 hour  Intake    240 ml  Output      0 ml  Net    240 ml   Filed Weights   11/12/12 1500 11/16/12 0340 11/17/12 0450  Weight: 150 lb 12.7 oz (68.4 kg) 144 lb 1.6 oz (65.363 kg) 146 lb 9.6 oz (66.497 kg)   PHYSICAL EXAM  General: Pleasant, NAD. Neuro: Alert and oriented X 2. Moves all extremities spontaneously. Psych: Normal affect. HEENT:  Normal  Neck: Supple without bruits or JVD. Lungs:  Resp regular and unlabored, CTA. Heart: IR, IR no s3, s4, 2/6 sem rusb. Abdomen: Soft, non-tender, non-distended, BS + x 4.  Extremities: No clubbing, cyanosis or edema. DP/PT/Radials 2+ and equal bilaterally. R groin cath site ecchymotic w/o bleeding, bruit, hematoma.  Accessory Clinical Findings  CBC  Recent Labs  11/20/12 0400 11/21/12 0444  WBC 7.0 6.9  HGB 13.4 13.3  HCT 37.7* 37.5*  MCV 99.0 98.9  PLT 149* 156   Basic  Metabolic Panel No results found for this basename: NA, K, CL, CO2, GLUCOSE, BUN, CREATININE, CALCIUM, MG, PHOS,  in the last 72 hours Lab Results  Component Value Date   INR 3.48* 11/21/2012   INR 2.68* 11/20/2012   INR 1.87* 11/19/2012    TELE  Afib, rate well controlled.  Radiology/Studies  Dg Chest Portable 1 View  11/12/2012   *RADIOLOGY REPORT*  Clinical Data: Chest pain.  PORTABLE CHEST - 1 VIEW  Comparison: 12/06/2008  Findings: Prior CABG.  Heart is borderline in size.  Tortuosity and ectasia of the thoracic aorta.  No confluent opacities or effusions.  No acute bony abnormality.  IMPRESSION: Borderline heart size.  No acute cardiopulmonary disease.   Original Report Authenticated By: Charlett Nose, M.D.   ASSESSMENT AND PLAN  1.  Acute Inf STEMI/CAD: s/p cath.  No culprit on cath.  Cont med Rx.  ? Embolic event related to afib.  Cont asa, coumadin, INR 3.48 today, statin, bb.  No P2Y12 inhibitor 2/2 need for coumadin.  2.  Afib:  Permanent.  Cont coumadin.  Rate controlled on bb.   3.  Mod AS:  Follow.  Nl EF by echo.  4.  Dementia/noncompliance: Plan for SNF/Rehab at DC. Family has selected [facility]. Plan DC today.    Theron Arista [facility] 11/21/2012 8:31 AM

## 2012-11-21 NOTE — Progress Notes (Signed)
CSW contacted facility to make aware that pt is ready to dc today. Facility is aware and has available bed today. Pt family will provide transportation. CSW will prepare for discharge as soon as dc summary is available.   Star Cheese, LCSWA [phone]

## 2012-11-21 NOTE — Progress Notes (Signed)
CSW received confirmation from facility that pt is able to dc when ready. CSW contacted pt daughter who is providing transportation to make aware that pt is ready. Pt daughter is currently on her way to pt house to pick up shoes for pt to dc with and she will on her way to pick up pt after. CSW has placed discharge packet with shadow chart and made nurse aware.   Russia Scheiderer, LCSWA [phone]

## 2012-11-21 NOTE — Telephone Encounter (Signed)
New problem   Pt has 7 day TCM w/ Tereso Newcomer 11/28/12 per Sheralyn Boatman PA calling.

## 2012-11-22 ENCOUNTER — Non-Acute Institutional Stay (SKILLED_NURSING_FACILITY): Payer: Medicare Other | Admitting: Internal Medicine

## 2012-11-22 DIAGNOSIS — R609 Edema, unspecified: Secondary | ICD-10-CM

## 2012-11-22 DIAGNOSIS — I2510 Atherosclerotic heart disease of native coronary artery without angina pectoris: Secondary | ICD-10-CM

## 2012-11-22 DIAGNOSIS — R413 Other amnesia: Secondary | ICD-10-CM

## 2012-11-22 DIAGNOSIS — I4891 Unspecified atrial fibrillation: Secondary | ICD-10-CM

## 2012-11-22 DIAGNOSIS — I2119 ST elevation (STEMI) myocardial infarction involving other coronary artery of inferior wall: Secondary | ICD-10-CM

## 2012-11-22 DIAGNOSIS — M109 Gout, unspecified: Secondary | ICD-10-CM

## 2012-11-22 DIAGNOSIS — E785 Hyperlipidemia, unspecified: Secondary | ICD-10-CM

## 2012-11-22 NOTE — Progress Notes (Signed)
Patient ID: Aaron [name], male   DOB: [birth year], 79 y.o.   MRN: [id number]  This is an acute visit.  Level of care skilled.  Facility  is [facility]  Date is 11/22/2012.  Complaint-acute visit status post hospitalization for MI history of present illness.  Patient is a pleasant 79 year old male who presented to the ER with epigastric pain and radiation of pain to his left arm.  EKG revealed a an inferior ST elevation MI.  He underwent emergent cardiac catheterization there was 60-70% proximal PDA lesion.  There was suspicion for a cardioembolic versus plaque lyses etiology and decision was made to medically manage this.  He was started on Coumadin although apparently there've been some history of medication noncompliance in the past.  Eventually was decided patient should go to skilled nursing temporarily with eventual transition to assisted living.  Of note the an echo cardiogram showed an ejection fraction of 60-65%  He was discharged on aspirin and ACE inhibitor beta blocker statin and nitroglycerin when necessary.  He also is on Coumadin with history of atrial fibrillation this was discussed with family risk versus benefit actually INR was elevated on discharge it is currently being held INR will be checked tomorrow.  .  Of note he also continues on a statin.  Previous medical history.  History of ST elevation MI.  Coronary artery disease.  Atrial fibrillation.  Aortic stenosis.  Memory loss.  Hyperlipidemia.  History of nonsustained V. tach.  Leukocytosis thought to be reactive.  Hospital studies.  As noted above.  Medications.  Aspirin 81 mg daily.  Lipitor 80 mg daily.  Lasix 20 mg daily.  Toprol-XL 37.5 mg daily.  Nitroglycerin when necessary.  Prilosec 20 mg twice a day.  Mavik 2 mg daily.  Coumadin currently on hold.  Social history.  Patient has been separated for 30 years from his wife-he lives by himself has 2 very supportive  daughters however.  Says he has a distant history of smoking and some alcohol use in the [employer] but has not really done either significantly since then.  Family history somewhat sketchy patient is a poor historian.--Apparently his father and mother both had history of CVA.  Review of systems.  In general denies any fever chills.  Skin-denies any rash or itching.  Head ears eyes nose mouth and throat-denies any visual changes sore throat or nasal discharge.  Respiratory-no shortness of breath or cough.  Cardiac-as stated history of present illness this is his having no chest pain currently.  GI Dass says his appetite is not great but denies any abdominal pain nausea vomiting diarrhea or constipation.  GU-does not complaining of dysuria.  Muscle skeletal-does not complaining of joint pain--other than some mild discomfort of his left second toe.  Neurologic-no complaints of headache or dizziness or syncopal-type feelings.  Psych-some history of memory loss.  Physical exam.  He is afebrile pulse of 86 respirations 17 blood pressure systolics appear to be in the 90s diastolics in the 60s.  General this is a very pleasant elderly male in no distress sitting comfortably in his chair.  His skin is warm and dry he does have some old bruising it appears lower arms bilaterally.  Eyes visual acuity appears intact pupils equal round reactive light sclerae and conjunctivae are clear.  He does have moderate dentation  Heart is regular irregular rate and rhythm distant heart sounds.  His chest is clear without labored breathing.  Abdomen soft nontender with positive bowel sounds.  Muscle  skeletal I do not note any deformities he is able to walk moves all extremities x4 appears to be doing well in this regard he does not have significant lower extremity edema  He had complained of some mild left second toe discomfort-I did evaluate it I do not see any edema or sign of infection however  he does have a history of gout.  Psych he is oriented to self month however he said the year was 15.  He could not name the president.  Neurologic-appears to be grossly intact no lateralizing findings his speech is clear.  Labs.  11/21/2012.  WBC 6.9 hemoglobin 13.3 platelets 156.  Sodium 140 potassium 3.7 BUN 21 creatinine 1.25.  Liver function tests within normal limits.  Assessment and plan.  #1-history of MI-coronary artery disease he continues on a beta blocker ACE inhibitor a statin he is also on anticoagulation with aspirin and Coumadin INR elevated in the hospital and is being held will recheck this tomorrow.  #2-history of atrial fibrillation again continues on the beta blocker is on Coumadin which is currently being held secondary to elevated INR apparently 3.48 in-hospital Will update INR.  This appears to be rate controlled currently.  #3-history of aortic stenosis-this appears to be asymptomatic relatively [place] to monitor.  #4-history of edema and as this appears stable he is on low dose Lasix Will update metabolic panel tomorrow.  #5-history of hyperlipidemia-he is on a statin orders to update liver function tests and lipid panel in 6 weeks per cardiology.  #6-some history of left second toe discomfort we'll order a uric acid apparently there is some history of gout in the past  #7-suspected history of GERD-he does continue on Prilosec.  #8-memory loss-family is concerned about him returning home this is understandable I agree assisted-living probably would be the best option once his rehabilitation  completed.  I suspect his stay here will be quite short he is ambulating appears clinically to be doing well  [id number]-of note greater than 45 minutes spent assessing patient-- reviewing his hospital records-i and. coordinating and formulating a plan of care for numerous diagnoses-of note greater than 50% of time spent coordinating and formulating a plan of  care     .

## 2012-11-22 NOTE — Telephone Encounter (Signed)
No answer, will have triage try again tomorrow

## 2012-11-22 NOTE — Telephone Encounter (Signed)
Patient called no answer.Patient's daughter Arline Asp called no answer.LMTC.

## 2012-11-23 ENCOUNTER — Non-Acute Institutional Stay (SKILLED_NURSING_FACILITY): Payer: Medicare Other | Admitting: Internal Medicine

## 2012-11-23 DIAGNOSIS — M109 Gout, unspecified: Secondary | ICD-10-CM

## 2012-11-23 DIAGNOSIS — I4891 Unspecified atrial fibrillation: Secondary | ICD-10-CM

## 2012-11-23 DIAGNOSIS — Z7901 Long term (current) use of anticoagulants: Secondary | ICD-10-CM

## 2012-11-23 NOTE — Telephone Encounter (Signed)
Pt's daughter,Cindy states patient was discharged to [facility] in [place], [phone]

## 2012-11-23 NOTE — Telephone Encounter (Signed)
Spoke with Marcelino Duster, nurse at [facility].

## 2012-11-23 NOTE — Telephone Encounter (Signed)
Aaron [name] was notified of appt for pt with Aaron Newcomer, PA,c 11/28/12 at 11:50AM. She states she will discuss transportation to our office with pt's daughter Arline Asp. Aaron [name] had questions about INR management. Aaron [name] states she discussed INR management with Kennon Rounds P earlier this morning and was told  pt's INR was never managed here in the past. Our previous records  indicate that INRs were followed in the past by [facility]. Aaron [name] is going to be in touch with [facility] about INR management. Aaron [name] states patient has his discharge medications and did not have any other questions about medications.

## 2012-11-24 ENCOUNTER — Ambulatory Visit (HOSPITAL_COMMUNITY): Payer: Medicare Other | Attending: Internal Medicine

## 2012-11-24 DIAGNOSIS — R0902 Hypoxemia: Secondary | ICD-10-CM | POA: Insufficient documentation

## 2012-11-25 ENCOUNTER — Ambulatory Visit (HOSPITAL_COMMUNITY)
Admit: 2012-11-25 | Discharge: 2012-11-25 | Disposition: A | Discharge status: Home / Self Care | Payer: Medicare Other | Source: Skilled Nursing Facility | Attending: Internal Medicine | Admitting: Internal Medicine

## 2012-11-25 DIAGNOSIS — R0609 Other forms of dyspnea: Secondary | ICD-10-CM | POA: Insufficient documentation

## 2012-11-25 DIAGNOSIS — R0989 Other specified symptoms and signs involving the circulatory and respiratory systems: Secondary | ICD-10-CM | POA: Insufficient documentation

## 2012-11-25 LAB — BLOOD GAS, ARTERIAL
Bicarbonate: 24.7 mEq/L — ABNORMAL HIGH (ref 20.0–24.0)
FIO2: 21 %
O2 Saturation: 96.7 %
pCO2 arterial: 40.2 mmHg (ref 35.0–45.0)
pO2, Arterial: 86.9 mmHg (ref 80.0–100.0)

## 2012-11-28 ENCOUNTER — Non-Acute Institutional Stay (SKILLED_NURSING_FACILITY): Payer: Medicare Other | Admitting: Internal Medicine

## 2012-11-28 ENCOUNTER — Ambulatory Visit (INDEPENDENT_AMBULATORY_CARE_PROVIDER_SITE_OTHER): Payer: Medicare Other | Admitting: Physician Assistant

## 2012-11-28 ENCOUNTER — Encounter: Payer: Self-pay | Admitting: Physician Assistant

## 2012-11-28 VITALS — BP 110/70 | HR 92 | Ht 67.0 in | Wt 147.0 lb

## 2012-11-28 DIAGNOSIS — I2510 Atherosclerotic heart disease of native coronary artery without angina pectoris: Secondary | ICD-10-CM

## 2012-11-28 DIAGNOSIS — I359 Nonrheumatic aortic valve disorder, unspecified: Secondary | ICD-10-CM

## 2012-11-28 DIAGNOSIS — I4891 Unspecified atrial fibrillation: Secondary | ICD-10-CM

## 2012-11-28 DIAGNOSIS — E7800 Pure hypercholesterolemia, unspecified: Secondary | ICD-10-CM

## 2012-11-28 DIAGNOSIS — I10 Essential (primary) hypertension: Secondary | ICD-10-CM

## 2012-11-28 NOTE — Progress Notes (Signed)
[address] [place], [place]  [address] Phone: [phone] Fax:  [phone]  Date:  11/28/2012   ID:  Aaron [name], DOB [birth year], MRN [id number]  PCP:  Colette Ribas, MD  Cardiologist:  Dr.  Shawnie Pons => will f/u in [place]     History of Present Illness: Aaron [name] is a 79 y.o. male who returns for follow up after a recent admission to the hospital 8/2-8/11 with inferior STEMI.  He has a hx of CAD, s/p CABG in 1998, prior PCI of the circumflex, permanent AFib, aortic stenosis. He presented to the hospital with epigastric pain while walking described as a pressure with associated left upper extremity pain.  ECG demonstrated inferior ST segment elevation.  Emergent LHC 11/12/12:  LAD occluded at the origin, mid CFX stent patent, proximal PDA 60-70%, LIMA-LAD patent. Unclear culprit for his inferior STEMI.  There was a question of embolic event given his atrial fibrillation.  Medical therapy was recommended.  Echocardiogram 11/14/12: Mild LVH, EF 60-65%, normal wall motion, indeterminate diastolic function, trivial AI, probable moderate AS (mild by gradient, severe by AVA), mean gradient 16, mild MR, moderate LAE, mild RVE, moderate RAE, mild to moderate TR, PASP 40.  Initially, given memory issues and fall risk, he was not felt to be a good candidate for Coumadin. He was initially treated with aspirin, Plavix, beta blocker, statin. It was ultimately decided to resume Coumadin and stop Plavix.  There was significant concern regarding patient's ability to complete independent ADLs at home by himself. He was discharged to SNF with eventual transition to ALF.  He was discharged to the [facility].  Since d/c, he is doing well.  He is here with his ex-wife and her son-in-law.  The patient denies chest pain, shortness of breath, syncope, orthopnea, PND or significant pedal edema.   Labs (8/14):   K 4, Cr 1.31, ALT 15, Hgb 13.3  Wt Readings from Last 3  Encounters:  11/28/12 147 lb (66.679 kg)  11/17/12 146 lb 9.6 oz (66.497 kg)  11/17/12 146 lb 9.6 oz (66.497 kg)     Past Medical History  Diagnosis Date  . Aortic stenosis     a. Echocardiogram 11/14/12: Mild LVH, EF 60-65%, normal wall motion, indeterminate diastolic function, trivial AI, probable moderate AS (mild by gradient, severe by AVA), mean gradient 16, mild MR, moderate LAE, mild RVE, moderate RAE, mild to moderate TR, PASP 40.  Marland Kitchen Hypertension   . Hyperlipidemia   . Gout   . CAD (coronary artery disease)     a. S/P CABG 1998,  b. PCI to CFX with BMS 2008 => ISR => s/p Cypher DES + POBA;  c. inf STEMI 8/14: LHC 11/12/12:  LAD occluded at the origin, mid CFX stent patent, proximal PDA 60-70%, LIMA-LAD patent. Unclear culprit for his inferior STEMI => med Rx (? embolic from AFib)  . Permanent atrial fibrillation     coumadin restarted 11/2012    No current outpatient prescriptions on file.   No current facility-administered medications for this visit.    Allergies:    Allergies  Allergen Reactions  . Penicillins     Social History:  The patient  reports that he has quit smoking. He does not have any smokeless tobacco history on file. He reports that he does not drink alcohol or use illicit drugs.   ROS:  Please see the history of present illness.    All other systems reviewed and  negative.   PHYSICAL EXAM: VS:  BP 110/70  Pulse 92  Ht 5\' 7"  (1.702 m)  Wt 147 lb (66.679 kg)  BMI 23.02 kg/m2 Well nourished, well developed, in no acute distress HEENT: normal Neck: no JVD Cardiac:  normal S1, S2; irregularly irregular rhythm; 1/6 systolic murmur at the RUSB Lungs:  clear to auscultation bilaterally, no wheezing, rhonchi or rales Abd: soft, nontender, no hepatomegaly Ext: trace bilateral LE edema; right groin without hematoma or bruit  Skin: warm and dry Neuro:  CNs 2-12 intact, no focal abnormalities noted  EKG:  Atrial fibrillation, HR 92, RBBB     ASSESSMENT AND  PLAN:  1. CAD: Doing well after recent inferior STEMI with unclear culprit (question embolic event from atrial fibrillation). No recurrent angina. Continue aspirin, statin, beta blocker. He currently lives at the [facility]. 2. Atrial Fibrillation: Rate is controlled. Coumadin is managed by his provider at the [facility]. 3. Aortic Stenosis: Moderate by recent assessment. Continue current therapy. 4. Hypertension: Controlled. 5. Hyperlipidemia: Continue statin. Check lipids and LFTs in 6 weeks. 6. Dementia: Managed by primary care. 7. Disposition: Follow up in 6-8 weeks. He lives in the [facility] in [place]. His family also lives in [place]. It would be easier for him to follow up in our clinic there and we will arrange this.   Signed, Tereso Newcomer, PA-C  11/28/2012 12:23 PM

## 2012-11-28 NOTE — Progress Notes (Signed)
Patient ID: Aaron [name], male   DOB: July 08, 1923, 79 y.o.   MRN: [id number] Facility; [facility] Chief complaint; review of admission to the facility, cardiac issue History; this is a pleasant 79 year old man who lives on his own and [place]. He tells me he was sitting in the mall where he usually goes on a daily basis to walk a mile per day. Afterwards he was talking with a friend when he does it discovered that he had an unusual chest sensation in the lower chest area. He went to the hospital and was diagnosed with an acute ST elevation inferior MI. He has an impressive cardiac history including a CABG in 1998, also has a stent in the left circumflex which was widely patent. He underwent cardiac catheterization on August 2, this showed occluded ostial LAD, patent LIMA to LAD, patent mid left circumflex stent, 60-70% proximal PDA stenosis. It was unclear whether his culprit lesion was and a question of a cardioembolic [A. fib] situation was raised the. He was deemed to be medical management. A 2-D echo on August 4 showed EF of 60-65%, mild MR, moderate left ear, moderate AS. Left atrium is dilated. He has mild to moderate. His aortic stenosis was felt to be moderate. Chronic atrial fibrillation.   Past Medical History  Diagnosis Date  . Aortic stenosis     a. Echocardiogram 11/14/12: Mild LVH, EF 60-65%, normal wall motion, indeterminate diastolic function, trivial AI, probable moderate AS (mild by gradient, severe by AVA), mean gradient 16, mild MR, moderate LAE, mild RVE, moderate RAE, mild to moderate TR, PASP 40.  Marland Kitchen Hypertension   . Hyperlipidemia   . Gout   . CAD (coronary artery disease)     a. S/P CABG 1998,  b. PCI to CFX with BMS 2008 => ISR => s/p Cypher DES + POBA;  c. inf STEMI 8/14: LHC 11/12/12:  LAD occluded at the origin, mid CFX stent patent, proximal PDA 60-70%, LIMA-LAD patent. Unclear culprit for his inferior STEMI => med Rx (? embolic from AFib)  . Permanent atrial fibrillation      coumadin restarted 11/2012   Medication list is reviewed; he is currently on Coumadin 5 mg a day with an INR today at 1.71  Review of systems Respiratory no shortness of breath on exertion Cardiac no exertional chest discomfort  Physical examination pulse rate 78 respirations 18 Gen. he is not in a distress Respiratory clear entry bilaterally Cardiac irregular heart sounds no S3 elevation of his jugular venous pressure. Soft midsystolic murmur. No evidence of CHF  Impression/plan #1 inferior MI he seems very stable at the moment he is already ambulatory without any symptoms #2 atrial fibrillation on chronic Coumadin I have continued him at 5 mg we'll recheck his PT and INR on Wednesday #3 aortic stenosis, moderate by description #4 listed as having memory loss in his [place] notes I am not certain I picked up on this however he lives alone and this may need further evaluate

## 2012-11-28 NOTE — Patient Instructions (Addendum)
NO CHANGES WERE MADE TODAY WITH YOUR MEDICATIONS  PLEASE HAVE [facility] GET LAB IN 6-8 WEEKS (FASTING LIPID AND LIVER PANEL) WITH THE RESULTS TO BE FAXED TO SCOTT WEAVER, PAC [phone]  PLEASE FOLLOW UP WITH ONE OF THE DR.'s IN OUR [place] OFFICE SINCE THIS IS CLOSER TO HOME FOR YOU

## 2012-12-08 ENCOUNTER — Non-Acute Institutional Stay (SKILLED_NURSING_FACILITY): Payer: Medicare Other | Admitting: Internal Medicine

## 2012-12-08 DIAGNOSIS — M109 Gout, unspecified: Secondary | ICD-10-CM

## 2012-12-08 DIAGNOSIS — I359 Nonrheumatic aortic valve disorder, unspecified: Secondary | ICD-10-CM

## 2012-12-08 DIAGNOSIS — I350 Nonrheumatic aortic (valve) stenosis: Secondary | ICD-10-CM

## 2012-12-08 DIAGNOSIS — I4821 Permanent atrial fibrillation: Secondary | ICD-10-CM

## 2012-12-08 DIAGNOSIS — R413 Other amnesia: Secondary | ICD-10-CM

## 2012-12-08 DIAGNOSIS — E782 Mixed hyperlipidemia: Secondary | ICD-10-CM

## 2012-12-08 DIAGNOSIS — I4891 Unspecified atrial fibrillation: Secondary | ICD-10-CM

## 2012-12-08 DIAGNOSIS — I2510 Atherosclerotic heart disease of native coronary artery without angina pectoris: Secondary | ICD-10-CM

## 2012-12-20 DIAGNOSIS — I359 Nonrheumatic aortic valve disorder, unspecified: Secondary | ICD-10-CM

## 2012-12-20 DIAGNOSIS — E7800 Pure hypercholesterolemia, unspecified: Secondary | ICD-10-CM

## 2012-12-20 DIAGNOSIS — I10 Essential (primary) hypertension: Secondary | ICD-10-CM

## 2012-12-20 DIAGNOSIS — I4891 Unspecified atrial fibrillation: Secondary | ICD-10-CM

## 2012-12-28 ENCOUNTER — Emergency Department (HOSPITAL_COMMUNITY): Payer: Medicare Other

## 2012-12-28 ENCOUNTER — Emergency Department (HOSPITAL_COMMUNITY)
Admission: EM | Admit: 2012-12-28 | Discharge: 2012-12-28 | Disposition: A | Discharge status: Home / Self Care | Payer: Medicare Other | Attending: Emergency Medicine | Admitting: Emergency Medicine

## 2012-12-28 ENCOUNTER — Encounter (HOSPITAL_COMMUNITY): Payer: Self-pay | Admitting: *Deleted

## 2012-12-28 DIAGNOSIS — Z9861 Coronary angioplasty status: Secondary | ICD-10-CM | POA: Insufficient documentation

## 2012-12-28 DIAGNOSIS — S8990XA Unspecified injury of unspecified lower leg, initial encounter: Secondary | ICD-10-CM | POA: Insufficient documentation

## 2012-12-28 DIAGNOSIS — Z23 Encounter for immunization: Secondary | ICD-10-CM | POA: Insufficient documentation

## 2012-12-28 DIAGNOSIS — Z7982 Long term (current) use of aspirin: Secondary | ICD-10-CM | POA: Insufficient documentation

## 2012-12-28 DIAGNOSIS — I2510 Atherosclerotic heart disease of native coronary artery without angina pectoris: Secondary | ICD-10-CM | POA: Insufficient documentation

## 2012-12-28 DIAGNOSIS — I4891 Unspecified atrial fibrillation: Secondary | ICD-10-CM | POA: Insufficient documentation

## 2012-12-28 DIAGNOSIS — I10 Essential (primary) hypertension: Secondary | ICD-10-CM | POA: Insufficient documentation

## 2012-12-28 DIAGNOSIS — E785 Hyperlipidemia, unspecified: Secondary | ICD-10-CM | POA: Insufficient documentation

## 2012-12-28 DIAGNOSIS — M25462 Effusion, left knee: Secondary | ICD-10-CM

## 2012-12-28 DIAGNOSIS — M109 Gout, unspecified: Secondary | ICD-10-CM | POA: Insufficient documentation

## 2012-12-28 DIAGNOSIS — Z880 Allergy status to penicillin: Secondary | ICD-10-CM | POA: Insufficient documentation

## 2012-12-28 DIAGNOSIS — Y929 Unspecified place or not applicable: Secondary | ICD-10-CM | POA: Insufficient documentation

## 2012-12-28 DIAGNOSIS — Z87891 Personal history of nicotine dependence: Secondary | ICD-10-CM | POA: Insufficient documentation

## 2012-12-28 DIAGNOSIS — Y9389 Activity, other specified: Secondary | ICD-10-CM | POA: Insufficient documentation

## 2012-12-28 DIAGNOSIS — Z79899 Other long term (current) drug therapy: Secondary | ICD-10-CM | POA: Insufficient documentation

## 2012-12-28 DIAGNOSIS — S0083XA Contusion of other part of head, initial encounter: Secondary | ICD-10-CM

## 2012-12-28 DIAGNOSIS — W19XXXA Unspecified fall, initial encounter: Secondary | ICD-10-CM

## 2012-12-28 DIAGNOSIS — W1809XA Striking against other object with subsequent fall, initial encounter: Secondary | ICD-10-CM | POA: Insufficient documentation

## 2012-12-28 DIAGNOSIS — S0003XA Contusion of scalp, initial encounter: Secondary | ICD-10-CM | POA: Insufficient documentation

## 2012-12-28 DIAGNOSIS — IMO0002 Reserved for concepts with insufficient information to code with codable children: Secondary | ICD-10-CM

## 2012-12-28 LAB — PROTIME-INR
INR: 2.43 — ABNORMAL HIGH (ref 0.00–1.49)
Prothrombin Time: 25.6 seconds — ABNORMAL HIGH (ref 11.6–15.2)

## 2012-12-28 MED ORDER — HYDROCODONE-ACETAMINOPHEN 5-325 MG PO TABS: 0.5000 | ORAL_TABLET | ORAL | Status: DC | PRN

## 2012-12-28 MED ORDER — TETANUS-DIPHTH-ACELL PERTUSSIS 5-2.5-18.5 LF-MCG/0.5 IM SUSP
0.5000 mL | Freq: Once | INTRAMUSCULAR | Status: AC
  Administered 2012-12-28: 0.5 mL via INTRAMUSCULAR
  Filled 2012-12-28: qty 0.5

## 2012-12-28 NOTE — ED Provider Notes (Signed)
CSN: [id number]     Arrival date & time 12/28/12  1745 History  This chart was scribed for Audree Camel, MD by Blanchard Kelch, ED Scribe. The patient was seen in room APA18/APA18. Patient's care was started at 5:51 PM.    Chief Complaint  Patient presents with  . Fall    Patient is a 79 y.o. male presenting with fall. The history is provided by the patient. No language interpreter was used.  Fall Pertinent negatives include no chest pain, no abdominal pain, no headaches and no shortness of breath.    HPI Comments: Aaron [name] is a 79 y.o. male brought in by ambulance, who presents to the Emergency Department due a fall that occurred just prior to arrival when he was running to get the phone and tripped. He fell forward on his abdomen and hit his forehead and knee. He denies syncope, chest pain, shortness of breath, abdominal pain or headache. He complains of sudden, constant, moderate left knee pain with swelling that occurred after the fall. He denies numbness or weakness in his lower extremities. He complains of abrasions on his right hand due to the fall. He is currently taking Coumadin. He does not know when his last tetanus vaccination was.    Past Medical History  Diagnosis Date  . Aortic stenosis     a. Echocardiogram 11/14/12: Mild LVH, EF 60-65%, normal wall motion, indeterminate diastolic function, trivial AI, probable moderate AS (mild by gradient, severe by AVA), mean gradient 16, mild MR, moderate LAE, mild RVE, moderate RAE, mild to moderate TR, PASP 40.  Marland Kitchen Hypertension   . Hyperlipidemia   . Gout   . CAD (coronary artery disease)     a. S/P CABG 1998,  b. PCI to CFX with BMS 2008 => ISR => s/p Cypher DES + POBA;  c. inf STEMI 8/14: LHC 11/12/12:  LAD occluded at the origin, mid CFX stent patent, proximal PDA 60-70%, LIMA-LAD patent. Unclear culprit for his inferior STEMI => med Rx (? embolic from AFib)  . Permanent atrial fibrillation     coumadin restarted 11/2012    Past Surgical History  Procedure Laterality Date  . Coronary artery bypass graft  09/07/1996    LIMA TO LAD MINIMALLY INVASIVE OFF PUMP  PROCEDURE RIGHT INGUINAL HERNIA [id number] ([facility]).  . Cardiac catheterization  11/12/2012    occluded ostial LAD, patent LIMA-LAD, patent mid LCx stent, 60-70% prox PDA stenosis, mild ascending aortic dilatation w/o dissection or AV insufficiency; unclear culprit lesion- ? cardioembolic vs plaque lysis   Family History  Problem Relation Age of Onset  . Stroke Mother   . Stroke Father   . Other Brother 31    MYOCARDIAL INFARCTION,NO OTHER SIBLINGD WITH CAD   History  Substance Use Topics  . Smoking status: Former Games developer  . Smokeless tobacco: Not on file  . Alcohol Use: No    Review of Systems  Respiratory: Negative for shortness of breath.   Cardiovascular: Negative for chest pain.  Gastrointestinal: Negative for abdominal pain.  Musculoskeletal: Positive for myalgias.  Neurological: Negative for syncope, weakness, numbness and headaches.  All other systems reviewed and are negative.    Allergies  Penicillins  Home Medications   Current Outpatient Rx  Name  Route  Sig  Dispense  Refill  . aspirin EC 81 MG tablet   Oral   Take 81 mg by mouth daily.         Marland Kitchen atorvastatin (LIPITOR) 80 MG tablet  Oral   Take 1 tablet (80 mg total) by mouth daily at 6 PM.   30 tablet   3   . colchicine 0.6 MG tablet   Oral   Take 0.6 mg by mouth daily.         Marland Kitchen docusate sodium 100 MG CAPS   Oral   Take 100 mg by mouth 2 (two) times daily.   60 capsule   3   . furosemide (LASIX) 20 MG tablet   Oral   Take 1 tablet (20 mg total) by mouth daily.   30 tablet   3   . metoprolol succinate (TOPROL-XL) 25 MG 24 hr tablet   Oral   Take 1.5 tablets (37.5 mg total) by mouth daily. Take with or immediately following a meal.   45 tablet   3   . nitroGLYCERIN (NITROSTAT) 0.4 MG SL tablet   Sublingual   Place 1 tablet (0.4 mg total) under  the tongue every 5 (five) minutes as needed for chest pain.   25 tablet   3   . omeprazole (PRILOSEC) 20 MG capsule   Oral   Take 20 mg by mouth 2 (two) times daily.         . Polyvinyl Alcohol-Povidone (REFRESH OP)   Left Eye   Place 1 drop into the left eye 2 (two) times daily.         . trandolapril (MAVIK) 2 MG tablet   Oral   Take 1 tablet (2 mg total) by mouth daily.   90 tablet   3     Patient needs appointment for future refills   . warfarin (COUMADIN) 4 MG tablet      Hold Coumadin today (8/11). Resume tomorrow (8/12). Take 4mg  (one tablet) M, W, F, Sa, Su. Take 2mg  (one half tablet) Tue, Thurs.   30 tablet   3    Triage Vitals: BP 155/88  Pulse 103  Temp(Src) 98.3 F (36.8 C) (Oral)  Resp 16  SpO2 96%  Physical Exam  Nursing note and vitals reviewed. Constitutional: He is oriented to person, place, and time. He appears well-developed and well-nourished. No distress.  HENT:  Head: Normocephalic and atraumatic.  Eyes: Conjunctivae and EOM are normal.  Neck: Neck supple. No tracheal deviation present.  Cardiovascular: Normal rate, regular rhythm and normal heart sounds.   Pulmonary/Chest: Effort normal and breath sounds normal. No respiratory distress.  Abdominal: Soft. Bowel sounds are normal. He exhibits no distension. There is no tenderness.  Musculoskeletal: Normal range of motion. He exhibits tenderness (left knee).  Swelling present in left knee.  Neurological: He is alert and oriented to person, place, and time.  Skin: Skin is warm and dry.  1 cm hematoma present on right upper forehead. Small and superficial skin tears present on right index and pinky finger.   Psychiatric: He has a normal mood and affect. His behavior is normal.    ED Course  Procedures (including critical care time)  DIAGNOSTIC STUDIES: Oxygen Saturation is 96% on room air, adequate by my interpretation.    COORDINATION OF CARE:  6:02 PM -Will order head CT, left knee  x-ray, Protime-INR and Boostrix injection. Patient verbalizes understanding and agrees with treatment plan.  Medications  TDaP (BOOSTRIX) injection 0.5 mL (not administered)    Labs Review Labs Reviewed  PROTIME-INR - Abnormal; Notable for the following:    Prothrombin Time 25.6 (*)    INR 2.43 (*)    All other components within  normal limits   Imaging Review Ct Head Wo Contrast  12/28/2012   *RADIOLOGY REPORT*  Clinical Data: Fall.  On Coumadin.  CT HEAD WITHOUT CONTRAST  Technique:  Contiguous axial images were obtained from the base of the skull through the vertex without contrast.  Comparison: None.  Findings: Right frontal/supraorbital subcutaneous hematoma.  No underlying fracture or intracranial hemorrhage.  Global atrophy without hydrocephalus.  Small vessel disease type changes without CT evidence of large acute infarct.  No intracranial mass lesion detected on this unenhanced exam.  Prominent vascular calcifications.  Mastoid air cells, middle ear cavities and visualized sinuses are clear.  IMPRESSION: Right frontal/supraorbital subcutaneous hematoma.  No underlying fracture or intracranial hemorrhage.  Global atrophy without hydrocephalus.  Small vessel disease type changes without CT evidence of large acute infarct.   Original Report Authenticated By: Lacy Duverney, M.D.   Dg Knee Complete 4 Views Left  12/28/2012   *RADIOLOGY REPORT*  Clinical Data: Fall.  The swelling  LEFT KNEE - COMPLETE 4+ VIEW  Comparison: None  Findings: There is marked prepatellar soft tissue swelling.  Small joint effusion is noted.  No fracture or subluxation identified. No radio-opaque foreign body or soft tissue calcifications.  IMPRESSION:  1.  Marked prepatellar soft tissue swelling. 2.  No fracture or subluxation identified.   Original Report Authenticated By: Signa Kell, M.D.    MDM   1. Fall, initial encounter   2. Traumatic hematoma of forehead, initial encounter   3. Knee swelling, left   4.  Skin tear    Patient's fall was mechanical. He denies any syncope. He is alert and oriented has a normal neuro exam. A CT of his head is negative except for a superficial hematoma. Knee x-ray shows no fractures. His INR is therapeutic. Discussed return precautions with daughter and patient understand. Since he is in a nursing home they state he can be followed by PCP tomorrow for recheck of his neurologic status. If anything were to change understanding to return to the ER immediately for recheck of his head.  I personally performed the services described in this documentation, which was scribed in my presence. The recorded information has been reviewed and is accurate.    Audree Camel, MD 12/28/12 1929

## 2012-12-28 NOTE — ED Notes (Signed)
Resident at [facility] - per EMS - pt was turning around to go to bathroom, stumbled over feet.  Fell to floor, hit head, got up and walked to nurses station to inform staff of fall.  Pt was ambulatory on scene.  Denies LOC.  C/o pain to left knee and LUQ.  Pt has small hematoma to right eyebrow.  Denies visual disturbances.  Bruising and swelling noted to left knee.  ROM of left knee WNL.  Pt alert and oriented x 4 at this time.  Pt reports taking coumadin.

## 2012-12-28 NOTE — ED Notes (Signed)
Pt also has skin tear to left index finger, bandaged by staff at [facility].

## 2013-01-08 NOTE — Progress Notes (Signed)
Patient ID: Aaron [name], male   DOB: [birth year], 79 y.o.   MRN: [id number]  This is an acute visit.  Level of care skilled.  Facility [facility].  The date is 11/23/2012.  Chief complaint-acute visit followup elevated INR with history of atrial fibrillation-also followup elevated uric acid.  History of present illness.  Patient is a pleasant elderly resident who is here for rehabilitation after sustaining an ST elevation MI-he was treated conservatively and is on aspirin as well as beta blocker statin and ACE inhibitor.  He appears to be doing well in this regard.  He also has a history of atrial fibrillation and is on Coumadin INR did go high in the hospital up to 3.4 a couple days ago it has been held since his admission here it is now 3.43-there's been no increased bruising or bleeding noted.  His family also is concerned about his left second toe it was slightly erythematous they stated he does have a history of gout-the uric acid was ordered which has come back elevated at 11.2.  He continues to have some mild irritation with the pill.  Family medical social history as been reviewed her admit note on 11/22/2012.  Medications have been reviewed per MAR.  Review of systems.  In general he denies any fever or chills.  Respiratory no complaints of shortness of breath.  Cardiac does not complaining of chest pain.  Muscle skeletal only complaint is some soreness of the left second toe.  Physical exam.  He is afebrile pulse of 89 respirations 18 blood pressure 96/62.  In general this is a somewhat frail elderly male in no distress very pleasant sitting in his chair.  The skin is warm and dry do not see any increased bruising or bleeding.  Oropharynx clear mucous membranes moist.  Chest is clear to auscultation without rhonchi rales or wheezes no labored breathing.  Heart is irregular irregular rate and rhythm.  Muscle skeletal-left second toe there is some slight  erythema and mild tenderness to palpation of the toe I do not note any deformity.  Labs.  11/23/2012.  Sodium 143 potassium 3.9 BUN 34 creatinine 1.39.  Uric acid-11.2.  INR is stated in history of present illness.  Assessment and plan.  #1-history of atrial fibrillation with elevated INR-Coumadin is still on hold it is slowly trending down we will recheck it tomorrow clinically he appears stable rate is controlled he is on a beta blocker.  #2-history of gout-apparently he is having some discomfort with left 2nd toe-Will start colchicine 0.6 mg daily and monitor  [id number]

## 2013-01-12 ENCOUNTER — Encounter: Payer: Self-pay | Admitting: Cardiology

## 2013-01-12 ENCOUNTER — Encounter: Payer: Medicare Other | Admitting: Cardiology

## 2013-01-12 ENCOUNTER — Ambulatory Visit: Payer: Medicare Other | Admitting: Cardiology

## 2013-01-12 NOTE — Progress Notes (Signed)
NNo-show. This encounter was created in error - please disregard. 

## 2013-01-28 NOTE — Progress Notes (Signed)
Patient ID: Aaron [name], male   DOB: 10/28/1923, 79 y.o.   MRN: [id number]  This is a discharge note.  Level of care skilled.  Facility [facility].  Date is 12/08/2012.  Chief complaint-discharge note.  History of present illness.  Patient is a pleasant 79 year old male who presented to the ER with epigastric pain and radiation of pain to his left arm.  EKG revealed a an inferior ST elevation MI.  He underwent emergent cardiac catheterization there was 60-70% proximal PDA lesion.  There was suspicion for a cardioembolic versus plaque lyses etiology and decision was made to medically manage this.  He was started on Coumadin although apparently there've been some history of medication noncompliance in the past.--He does have a history of atrial fibrillation  Eventually was decided patient should go to skilled nursing temporarily with eventual transition to assisted living.  Of note the an echo cardiogram showed an ejection fraction of 60-65%  He was discharged on aspirin and ACE inhibitor beta blocker statin and nitroglycerin when necessary.  He also is on Coumadin with history of atrial fibrillation this was discussed with family risk versus benefit   .  Of note he also continues on a statin.  He has done well during his stay here and I believe he will be going to an assisted living facility  He has been started on colchicine for history of gout and left toe discomfort this was his second toe-his uric acid was elevate His INR was elevated on initial admission here but this has stabilized most recent INR was 2.6 to and we will need to make sure there is another one done by home health early next weekt most recent INR was done on August 25    Previous medical history.  History of ST elevation MI.  Coronary artery disease.  Atrial fibrillation.  Aortic stenosis.  Memory loss.  Hyperlipidemia.  History of nonsustained V. tach.  Leukocytosis thought to be reactive.  Hospital studies.   As noted above .  Medications.  Aspirin 81 mg daily.  Lipitor 80 mg daily.  Lasix 20 mg daily.  Toprol-XL 37.5 mg daily.  Nitroglycerin when necessary.  Prilosec 20 mg twice a day.  Mavik 2 mg daily.  Coumadin 5 mg daily .  Social history.  Patient has been separated for 30 years from his wife-he lives by himself has 2 very supportive daughters however.  Says he has a distant history of smoking and some alcohol use in the [employer] but has not really done either significantly since then.  Family history somewhat sketchy patient is a poor historian.--Apparently his father and mother both had history of CVA.   Review of systems.  In general denies any fever chills.  Skin-denies any rash or itching.  Head ears eyes nose mouth and throat-denies any visual changes sore throat or nasal discharge.  Respiratory-no shortness of breath or cough.  Cardiac-as stated history of present illness this is his having no chest pain currently.  GI Dass says his appetite is not great but denies any abdominal pain nausea vomiting diarrhea or constipation.  GU-does not complaining of dysuria.  Muscle skeletal-does not complaining of joint pain--other than some mild discomfort of his left second toe.  Neurologic-no complaints of headache or dizziness or syncopal-type feelings. Endocrine-does have a history of gout we have started him on colchicine secondary to elevated uric acid and toe pain-apparently this is improved  Psych-some history of memory loss .  Physical exam.  Temperature 97.8  pulse 75 respirations 20 blood pressure 109/72 O2 saturation 92% weight is 149 this is relatively stable  General this is a very pleasant elderly male in no distress sitting comfortably in his chair.  His skin is warm and dry he does have some old bruising it appears lower arms bilaterally.  Eyes visual acuity appears intact pupils equal round reactive light sclerae and conjunctivae are clear.  He does have moderate  dentation  Heart is regular irregular rate and rhythm distant heart sound-- cannot appreciate a murmur but he does have apparently a history of this-s.  His chest is clear without labored breathing.  Abdomen soft nontender with positive bowel sounds.  Muscle skeletal I do not note any deformities he is able to walk moves all extremities x4 appears to be doing well in this regard he does not have significant lower extremity edema   .  Psych he is oriented to self month but somewhat confused to time and date general he is aware of location.  Neurologic-appears to be grossly intact no lateralizing findings his speech is clear .  Labs. 12/05/2012.  INR-2.6 to.  11/30/2012.  INR-2.08.  11/23/2012.  Sodium 143 potassium 3.9 BUN 34 creatinine 1.39.  Uric acid-11.2.  11/21/2012.  WBC 7.0 hemoglobin 13.4 platelets 149  .  Sodium 140 potassium 3.7 BUN 21 creatinine 1.25.  Liver function tests within normal limits .  Assessment and plan.  #1-history of MI-coronary artery disease he continues on a beta blocker ACE inhibitor a statin he is also on anticoagulation with aspirin and Coumadin he appears to be doing well-INR will have to be checked next week by home health.  #2-history of atrial fibrillation again continues on the beta blocker is on Coumadin.  This appears to be rate controlled currently.  #3-history of aortic stenosis-this appears to be asymptomatic .  #4-history of edema and as this appears stable he is on low dose Lasix Will update metabolic panel tomorrow.  #5-history of hyperlipidemia-he is on a statin orders to update liver function tests and lipid panel in 6 weeks per cardiology.  #6-some history of left second toe discomfort -this appears to be resolved he is on colchicine with history of gout  #7-suspected history of GERD-he does continue on Prilosec.  #8-memory loss-family is concerned about him returning home and thus he is going to assisted living--he appears to be  okay with this  Of note Will update CBC tomorrow as well for updated values--especially with his history of being on anticoagulant    [id number]-of note greater than 30 minutes spent preparing this discharge summary .

## 2013-04-14 ENCOUNTER — Encounter: Payer: Self-pay | Admitting: *Deleted

## 2013-04-28 ENCOUNTER — Encounter: Payer: Self-pay | Admitting: Cardiology

## 2013-04-28 ENCOUNTER — Ambulatory Visit (INDEPENDENT_AMBULATORY_CARE_PROVIDER_SITE_OTHER): Payer: Medicare Other | Admitting: Cardiology

## 2013-04-28 VITALS — BP 115/48 | HR 66 | Ht 67.0 in | Wt 150.2 lb

## 2013-04-28 DIAGNOSIS — I350 Nonrheumatic aortic (valve) stenosis: Secondary | ICD-10-CM

## 2013-04-28 DIAGNOSIS — I4891 Unspecified atrial fibrillation: Secondary | ICD-10-CM

## 2013-04-28 DIAGNOSIS — I2510 Atherosclerotic heart disease of native coronary artery without angina pectoris: Secondary | ICD-10-CM

## 2013-04-28 DIAGNOSIS — I359 Nonrheumatic aortic valve disorder, unspecified: Secondary | ICD-10-CM

## 2013-04-28 DIAGNOSIS — I4821 Permanent atrial fibrillation: Secondary | ICD-10-CM

## 2013-04-28 DIAGNOSIS — E782 Mixed hyperlipidemia: Secondary | ICD-10-CM

## 2013-04-28 NOTE — Assessment & Plan Note (Signed)
He continues on statin therapy.

## 2013-04-28 NOTE — Assessment & Plan Note (Signed)
Overall moderate with discrepancy between mean gradient and calculated area as outlined above. Followup echocardiogram to be obtained for next visit in 6 months.

## 2013-04-28 NOTE — Assessment & Plan Note (Signed)
Continue strategy of heart rate control and anticoagulation. [facility].

## 2013-04-28 NOTE — Assessment & Plan Note (Signed)
Clinically stable at this point on medical therapy. Cardiac catheterization from August 2014 is noted above. Plan to continue medical therapy and observation.

## 2013-04-28 NOTE — Patient Instructions (Addendum)
Your physician wants you to follow-up in: 6 MONTHS You will receive a reminder letter in the mail two months in advance. If you don't receive a letter, please call our office to schedule the follow-up appointment.  Your physician has requested that you have an echocardiogram. Echocardiography is a painless test that uses sound waves to create images of your heart. It provides your doctor with information about the size and shape of your heart and how well your heart's chambers and valves are working. This procedure takes approximately one hour. There are no restrictions for this procedure.PRIOR TO HIS 6 MONTHS VISIT, WE WILL CALL YOU TO SCHEDULE THE ECHO PRIOR TO YOUR FOLLOW UP APPOINTMENT IN 6 MONTHS

## 2013-04-28 NOTE — Progress Notes (Signed)
Clinical Summary Aaron [name] is an 80 y.o.male presenting for an office visit, former patient of Dr. Riley [name], last seen by Mr. Aaron [name] in August 2014. This is our first meeting in the office. Records reviewed with cardiac history outlined below. He is a current resident of [address]. He is here with his ex-wife today.  He tells that he has been doing well, does have short-term memory problems according to his ex-wife. He is functional in his ADLs, also does some exercises at [facility] that include dancing on Thursdays. He reports no angina or palpitations, denies any recent falls. He has had no syncope, no leg edema. Does have intermittent gout symptoms.  Coumadin is followed at the [facility]. He denies any bleeding problems. We reviewed his medications.   Allergies  Allergen Reactions  . Penicillins     Current Outpatient Prescriptions  Medication Sig Dispense Refill  . aspirin EC 81 MG tablet Take 81 mg by mouth daily.      Marland Kitchen atorvastatin (LIPITOR) 80 MG tablet Take 20 mg by mouth daily at 6 PM.      . colchicine 0.6 MG tablet Take 0.6 mg by mouth daily.      Marland Kitchen docusate sodium 100 MG CAPS Take 100 mg by mouth 2 (two) times daily.  60 capsule  3  . donepezil (ARICEPT) 5 MG tablet Take 5 mg by mouth at bedtime.      . furosemide (LASIX) 20 MG tablet Take 1 tablet (20 mg total) by mouth daily.  30 tablet  3  . HYDROcodone-acetaminophen (NORCO) 5-325 MG per tablet Take 0.5-1 tablets by mouth every 4 (four) hours as needed for pain.  10 tablet  0  . metoprolol succinate (TOPROL-XL) 25 MG 24 hr tablet Take 1.5 tablets (37.5 mg total) by mouth daily. Take with or immediately following a meal.  45 tablet  3  . nitroGLYCERIN (NITROSTAT) 0.4 MG SL tablet Place 1 tablet (0.4 mg total) under the tongue every 5 (five) minutes as needed for chest pain.  25 tablet  3  . omeprazole (PRILOSEC) 20 MG capsule Take 20 mg by mouth 2 (two) times daily.      . Polyvinyl  Alcohol-Povidone (REFRESH OP) Place 1 drop into the left eye 2 (two) times daily.      . trandolapril (MAVIK) 2 MG tablet Take 1 tablet (2 mg total) by mouth daily.  90 tablet  3  . warfarin (COUMADIN) 4 MG tablet Hold Coumadin today (8/11). Resume tomorrow (8/12). Take 4mg  (one tablet) M, W, F, Sa, Su. Take 2mg  (one half tablet) Tue, Thurs.  30 tablet  3   No current facility-administered medications for this visit.    Past Medical History  Diagnosis Date  . Aortic stenosis     Probable moderate with discordant gradient (mild) and area (severe)  . Essential hypertension, benign   . Hyperlipidemia   . Gout   . Coronary atherosclerosis of native coronary artery     a. S/P CABG 1998,  b. PCI to CFX with BMS 2008 => ISR => s/p Cypher DES + POBA;  c. inf STEMI 8/14: LHC 11/12/12:  LAD occluded at the origin, mid CFX stent patent, proximal PDA 60-70%, LIMA-LAD patent. Unclear culprit for his inferior STEMI => med Rx (? embolic from AFib)  . Permanent atrial fibrillation     Coumadin restarted 11/2012    Past Surgical History  Procedure Laterality Date  . Coronary artery bypass graft  09/07/1996    LIMA TO LAD - minimally invasive off-pump procedure  . Cardiac catheterization  11/12/2012    Occluded ostial LAD, patent LIMA-LAD, patent mid LCx stent, 60-70% prox PDA stenosis, mild ascending aortic dilatation w/o dissection or AV insufficiency; unclear culprit lesion- ? cardioembolic vs plaque lysis  . Inguinal hernia repair      Social History Aaron [name] reports that he has quit smoking. His smoking use included Cigarettes. He smoked 0.00 packs per day. He does not have any smokeless tobacco history on file. Aaron [name] reports that he does not drink alcohol.  Review of Systems Somewhat hard of hearing. Otherwise as outlined above.  Physical Examination Filed Vitals:   04/28/13 1023  BP: 115/48  Pulse: 66   Filed Weights   04/28/13 1023  Weight: 150 lb 4 oz (68.153 kg)     Normally nourished appearing elderly male, comfortable at rest. HEENT: Conjunctiva and lids normal, oropharynx clear with moist mucosa. Neck: Supple, no elevated JVP or carotid bruits, no thyromegaly. Lungs: Clear to auscultation, nonlabored breathing at rest. Cardiac: Regular rate and rhythm, no S3 2/6 systolic murmur c/w AS, no pericardial rub. Abdomen: Soft, nontender, bowel sounds present, no guarding or rebound. Extremities: No pitting edema, mild stasis changes, distal pulses 2+. Skin: Warm and dry. Musculoskeletal: No kyphosis. Neuropsychiatric: Alert and oriented x2, affect grossly appropriate.   Problem List and Plan   CAD, NATIVE VESSEL Clinically stable at this point on medical therapy. Cardiac catheterization from August 2014 is noted above. Plan to continue medical therapy and observation.  Permanent atrial fibrillation Continue strategy of heart rate control and anticoagulation. [facility].  Aortic stenosis Overall moderate with discrepancy between mean gradient and calculated area as outlined above. Followup echocardiogram to be obtained for next visit in 6 months.  Mixed hyperlipidemia He continues on statin therapy.    Jonelle SidleSamuel G. Alaiah Lundy, M.D., F.A.C.C.

## 2013-08-21 ENCOUNTER — Emergency Department (HOSPITAL_COMMUNITY)
Admission: EM | Admit: 2013-08-21 | Discharge: 2013-08-21 | Disposition: A | Discharge status: Home / Self Care | Payer: Medicare Other | Attending: Emergency Medicine | Admitting: Emergency Medicine

## 2013-08-21 ENCOUNTER — Encounter (HOSPITAL_COMMUNITY): Payer: Self-pay | Admitting: Emergency Medicine

## 2013-08-21 ENCOUNTER — Emergency Department (HOSPITAL_COMMUNITY): Payer: Medicare Other

## 2013-08-21 DIAGNOSIS — I2510 Atherosclerotic heart disease of native coronary artery without angina pectoris: Secondary | ICD-10-CM | POA: Insufficient documentation

## 2013-08-21 DIAGNOSIS — Z9889 Other specified postprocedural states: Secondary | ICD-10-CM | POA: Insufficient documentation

## 2013-08-21 DIAGNOSIS — Z87891 Personal history of nicotine dependence: Secondary | ICD-10-CM | POA: Insufficient documentation

## 2013-08-21 DIAGNOSIS — Z79899 Other long term (current) drug therapy: Secondary | ICD-10-CM | POA: Insufficient documentation

## 2013-08-21 DIAGNOSIS — M1990 Unspecified osteoarthritis, unspecified site: Secondary | ICD-10-CM

## 2013-08-21 DIAGNOSIS — Z951 Presence of aortocoronary bypass graft: Secondary | ICD-10-CM | POA: Insufficient documentation

## 2013-08-21 DIAGNOSIS — I10 Essential (primary) hypertension: Secondary | ICD-10-CM | POA: Insufficient documentation

## 2013-08-21 DIAGNOSIS — Z7901 Long term (current) use of anticoagulants: Secondary | ICD-10-CM | POA: Insufficient documentation

## 2013-08-21 DIAGNOSIS — Z880 Allergy status to penicillin: Secondary | ICD-10-CM | POA: Insufficient documentation

## 2013-08-21 DIAGNOSIS — I4891 Unspecified atrial fibrillation: Secondary | ICD-10-CM | POA: Insufficient documentation

## 2013-08-21 DIAGNOSIS — E785 Hyperlipidemia, unspecified: Secondary | ICD-10-CM | POA: Insufficient documentation

## 2013-08-21 DIAGNOSIS — Z7982 Long term (current) use of aspirin: Secondary | ICD-10-CM | POA: Insufficient documentation

## 2013-08-21 DIAGNOSIS — M109 Gout, unspecified: Secondary | ICD-10-CM | POA: Insufficient documentation

## 2013-08-21 DIAGNOSIS — M129 Arthropathy, unspecified: Secondary | ICD-10-CM | POA: Insufficient documentation

## 2013-08-21 MED ORDER — HYDROCODONE-ACETAMINOPHEN 5-325 MG PO TABS: 0.5000 | ORAL_TABLET | ORAL | Status: DC | PRN

## 2013-08-21 MED ORDER — PREDNISONE 10 MG PO TABS: ORAL_TABLET | ORAL | Status: DC

## 2013-08-21 NOTE — ED Provider Notes (Signed)
CSN: [id number]     Arrival date & time 08/21/13  1124 History   First MD Initiated Contact with Patient 08/21/13 1301     Chief Complaint  Patient presents with  . Hand Pain     (Consider location/radiation/quality/duration/timing/severity/associated sxs/prior Treatment) HPI Comments: Patient presents to the ER for evaluation of right hand swelling. The symptoms have been ongoing for several days. He started with swelling and pain in the joints of the pinky finger. He now is complaining of symptoms in, fourth and third fingers. He denies injury. He reports that he did have gout once in his toes and he felt similarly. Pain is moderate and constant, worsens when he moves the fingers.  Patient is a 80 y.o. male presenting with hand pain.  Hand Pain    Past Medical History  Diagnosis Date  . Aortic stenosis     Probable moderate with discordant gradient (mild) and area (severe)  . Essential hypertension, benign   . Hyperlipidemia   . Gout   . Coronary atherosclerosis of native coronary artery     a. S/P CABG 1998,  b. PCI to CFX with BMS 2008 => ISR => s/p Cypher DES + POBA;  c. inf STEMI 8/14: LHC 11/12/12:  LAD occluded at the origin, mid CFX stent patent, proximal PDA 60-70%, LIMA-LAD patent. Unclear culprit for his inferior STEMI => med Rx (? embolic from AFib)  . Permanent atrial fibrillation     Coumadin restarted 11/2012   Past Surgical History  Procedure Laterality Date  . Coronary artery bypass graft  09/07/1996    LIMA TO LAD - minimally invasive off-pump procedure  . Cardiac catheterization  11/12/2012    Occluded ostial LAD, patent LIMA-LAD, patent mid LCx stent, 60-70% prox PDA stenosis, mild ascending aortic dilatation w/o dissection or AV insufficiency; unclear culprit lesion- ? cardioembolic vs plaque lysis  . Inguinal hernia repair    . Hernia repair     Family History  Problem Relation Age of Onset  . Stroke Mother   . Stroke Father   . Other Brother [id number]    MI    History  Substance Use Topics  . Smoking status: Former Smoker    Types: Cigarettes  . Smokeless tobacco: Not on file     Comment: smoked from age [age] and then quit  . Alcohol Use: No    Review of Systems  Constitutional: Negative for fever.  Musculoskeletal: Positive for arthralgias.  All other systems reviewed and are negative.     Allergies  Penicillins  Home Medications   Prior to Admission medications   Medication Sig Start Date End Date Taking? Authorizing Provider  acetaminophen (TYLENOL) 325 MG tablet Take 650 mg by mouth every 4 (four) hours as needed for mild pain.   Yes Historical Provider, MD  aspirin EC 81 MG tablet Take 81 mg by mouth daily.   Yes Historical Provider, MD  atorvastatin (LIPITOR) 20 MG tablet Take 20 mg by mouth daily.   Yes Historical Provider, MD  divalproex (DEPAKOTE) 125 MG DR tablet Take 125 mg by mouth at bedtime.   Yes Historical Provider, MD  donepezil (ARICEPT) 5 MG tablet Take 5 mg by mouth at bedtime.   Yes Historical Provider, MD  furosemide (LASIX) 20 MG tablet Take 1 tablet (20 mg total) by mouth daily. 11/21/12  Yes Roger A Arguello, PA-C  guaifenesin (TUSSIN EXPECTORANT) 100 MG/5ML syrup Take 200 mg by mouth every 6 (six) hours as needed for cough.  Yes Historical Provider, MD  ibuprofen (ADVIL,MOTRIN) 200 MG tablet Take 200 mg by mouth 5 (five) times daily as needed for moderate pain.   Yes Historical Provider, MD  Melatonin 3 MG TABS Take 1 tablet by mouth at bedtime.   Yes Historical Provider, MD  metoprolol succinate (TOPROL-XL) 25 MG 24 hr tablet Take 1.5 tablets (37.5 mg total) by mouth daily. Take with or immediately following a meal. 11/21/12  Yes Roger A Arguello, PA-C  nitroGLYCERIN (NITROSTAT) 0.4 MG SL tablet Place 1 tablet (0.4 mg total) under the tongue every 5 (five) minutes as needed for chest pain. 11/21/12  Yes Roger A Arguello, PA-C  Nutritional Supplements (ENSURE HIGH PROTEIN) LIQD Take 1 Can by mouth 2 (two)  times daily.   Yes Historical Provider, MD  Olopatadine HCl (PATADAY) 0.2 % SOLN Place 2 drops into both eyes daily.   Yes Historical Provider, MD  omeprazole (PRILOSEC) 20 MG capsule Take 20 mg by mouth 2 (two) times daily.   Yes Historical Provider, MD  Polyvinyl Alcohol-Povidone (REFRESH OP) Place 1 drop into the left eye 2 (two) times daily as needed (dry or itching eyes.).    Yes Historical Provider, MD  trandolapril (MAVIK) 2 MG tablet Take 1 tablet (2 mg total) by mouth daily. 11/21/12  Yes Roger A Arguello, PA-C  warfarin (COUMADIN) 4 MG tablet Take 4 mg by mouth daily.   Yes Historical Provider, MD   BP 128/82  Pulse 68  Temp(Src) 98 F (36.7 C) (Oral)  Resp 14  SpO2 98% Physical Exam  Constitutional: He is oriented to person, place, and time. He appears well-developed and well-nourished. No distress.  HENT:  Head: Normocephalic and atraumatic.  Right Ear: Hearing normal.  Left Ear: Hearing normal.  Nose: Nose normal.  Mouth/Throat: Oropharynx is clear and moist and mucous membranes are normal.  Eyes: Conjunctivae and EOM are normal. Pupils are equal, round, and reactive to light.  Neck: Normal range of motion. Neck supple.  Cardiovascular: Regular rhythm, S1 normal and S2 normal.  Exam reveals no gallop and no friction rub.   No murmur heard. Pulmonary/Chest: Effort normal and breath sounds normal. No respiratory distress. He exhibits no tenderness.  Abdominal: Soft. Normal appearance and bowel sounds are normal. There is no hepatosplenomegaly. There is no tenderness. There is no rebound, no guarding, no tenderness at McBurney's point and negative Murphy's sign. No hernia.  Musculoskeletal: Normal range of motion.       Arms: Neurological: He is alert and oriented to person, place, and time. He has normal strength. No cranial nerve deficit or sensory deficit. Coordination normal. GCS eye subscore is 4. GCS verbal subscore is 5. GCS motor subscore is 6.  Skin: Skin is warm, dry  and intact. No rash noted. No cyanosis.  Psychiatric: He has a normal mood and affect. His speech is normal and behavior is normal. Thought content normal.    ED Course  Procedures (including critical care time) Labs Review Labs Reviewed - No data to display  Imaging Review Dg Hand Complete Right  08/21/2013   CLINICAL DATA:  Right hand pain and swelling.  EXAM: RIGHT HAND - COMPLETE 3+ VIEW  COMPARISON:  None.  FINDINGS: There is no evidence of fracture or dislocation. Narrowing and hypertrophy of the distal interphalangeal joint of the index finger is noted. Soft tissues are unremarkable.  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 No acute abnormality seen in the right hand.  Electronically Signed   By: Roque Lias M.D.   On: 08/21/2013 13:15     EKG Interpretation None      MDM   Final diagnoses:  Arthritis, possible gout    Patient with inflammatory response of the PIP joint of the right third, fourth, fifth fingers. No known injury. X-ray unremarkable other than degenerative changes. Mild erythema associated with the swelling of the joints. This does not appear to be a septic joint, started in the fifth digit, then spread to the fourth digit, then the third digit. Inflammatory arthralgia, possibly gout. Patient will be treated with prednisone, analgesia, followup with primary doctor in the office this week.    Gilda Crease, MD 08/21/13 1331

## 2013-08-21 NOTE — Discharge Instructions (Signed)

## 2013-08-21 NOTE — ED Notes (Signed)
Rt hand swelling , redness. No known injury

## 2014-01-18 ENCOUNTER — Ambulatory Visit (INDEPENDENT_AMBULATORY_CARE_PROVIDER_SITE_OTHER): Payer: Medicare Other | Admitting: Cardiology

## 2014-01-18 ENCOUNTER — Encounter: Payer: Self-pay | Admitting: Cardiology

## 2014-01-18 VITALS — BP 120/52 | HR 60 | Ht 69.0 in | Wt 153.0 lb

## 2014-01-18 DIAGNOSIS — I350 Nonrheumatic aortic (valve) stenosis: Secondary | ICD-10-CM

## 2014-01-18 DIAGNOSIS — I482 Chronic atrial fibrillation, unspecified: Secondary | ICD-10-CM

## 2014-01-18 DIAGNOSIS — I2510 Atherosclerotic heart disease of native coronary artery without angina pectoris: Secondary | ICD-10-CM

## 2014-01-18 DIAGNOSIS — E782 Mixed hyperlipidemia: Secondary | ICD-10-CM

## 2014-01-18 DIAGNOSIS — I4821 Permanent atrial fibrillation: Secondary | ICD-10-CM

## 2014-01-18 NOTE — Assessment & Plan Note (Signed)
Hee is on Xarelto and Toprol-XL.

## 2014-01-18 NOTE — Assessment & Plan Note (Signed)
On Lipitor 

## 2014-01-18 NOTE — Patient Instructions (Signed)
Your physician wants you to follow-up in: 6 months You will receive a reminder letter in the mail two months in advance. If you don't receive a letter, please call our office to schedule the follow-up appointment.     Your physician recommends that you continue on your current medications as directed. Please refer to the Current Medication list given to you today.      Thank you for choosing [facility] !        

## 2014-01-18 NOTE — Progress Notes (Signed)
Clinical Summary Mr. Aaron [name] is an 80 y.o.male last seen in January. He is here with a family member. Still resides at [facility]. He gets assistance with his medications. Denies any chest pain. With typical ADLs he reports NYHA class II dyspnea. He denies any dizziness or syncope.  He is due for a followup echocardiogram to reevaluate degree of aortic stenosis. I discussed this with the patient and his family member today. His feeling is that he would not pursue any further invasive assessment of his aortic stenosis, even if it were to progress. This would include TAVR or or open heart surgery based on our discussion today. In that case he asked if we could just hold off on doing a followup echocardiogram since it is unlikely to change our approach to treatment. I concurred with this.  ECG today shows rate-controlled atrial fibrillation with right bundle branch block and left anterior fascicular block.   Allergies  Allergen Reactions  . Penicillins Other (See Comments)    unknown    Current Outpatient Prescriptions  Medication Sig Dispense Refill  . acetaminophen (TYLENOL) 325 MG tablet Take 650 mg by mouth every 4 (four) hours as needed for mild pain.      Marland Kitchen allopurinol (ZYLOPRIM) 100 MG tablet Take 100 mg by mouth daily.      Marland Kitchen aspirin EC 81 MG tablet Take 81 mg by mouth daily.      Marland Kitchen atorvastatin (LIPITOR) 20 MG tablet Take 20 mg by mouth daily.      . colchicine 0.6 MG tablet Take 0.6 mg by mouth daily.      . divalproex (DEPAKOTE) 125 MG DR tablet Take 125 mg by mouth at bedtime.      . donepezil (ARICEPT) 5 MG tablet Take 5 mg by mouth at bedtime.      . furosemide (LASIX) 20 MG tablet Take 1 tablet (20 mg total) by mouth daily.  30 tablet  3  . HYDROcodone-acetaminophen (NORCO/VICODIN) 5-325 MG per tablet Take 0.5 tablets by mouth every 4 (four) hours as needed for moderate pain.  10 tablet  0  . ibuprofen (ADVIL,MOTRIN) 200 MG tablet Take 200 mg by mouth 5 (five) times daily  as needed for moderate pain.      . metoprolol succinate (TOPROL-XL) 25 MG 24 hr tablet Take 1.5 tablets (37.5 mg total) by mouth daily. Take with or immediately following a meal.  45 tablet  3  . nitroGLYCERIN (NITROSTAT) 0.4 MG SL tablet Place 1 tablet (0.4 mg total) under the tongue every 5 (five) minutes as needed for chest pain.  25 tablet  3  . Nutritional Supplements (ENSURE HIGH PROTEIN) LIQD Take 1 Can by mouth 2 (two) times daily.      . Olopatadine HCl (PATADAY) 0.2 % SOLN Place 2 drops into both eyes daily.      Marland Kitchen omeprazole (PRILOSEC) 20 MG capsule Take 20 mg by mouth 2 (two) times daily.      . Polyvinyl Alcohol-Povidone (REFRESH OP) Place 1 drop into the left eye 2 (two) times daily as needed (dry or itching eyes.).       Marland Kitchen Rivaroxaban (XARELTO) 15 MG TABS tablet Take 15 mg by mouth 2 (two) times daily with a meal.       No current facility-administered medications for this visit.    Past Medical History  Diagnosis Date  . Aortic stenosis     Probable moderate with discordant gradient (mild) and area (severe)  . Essential  hypertension, benign   . Hyperlipidemia   . Gout   . Coronary atherosclerosis of native coronary artery     a. S/P CABG 1998,  b. PCI to CFX with BMS 2008 => ISR => s/p Cypher DES + POBA;  c. inf STEMI 8/14: LHC 11/12/12:  LAD occluded at the origin, mid CFX stent patent, proximal PDA 60-70%, LIMA-LAD patent. Unclear culprit for his inferior STEMI => med Rx (? embolic from AFib)  . Permanent atrial fibrillation     Coumadin restarted 11/2012    Past Surgical History  Procedure Laterality Date  . Coronary artery bypass graft  09/07/1996    LIMA TO LAD - minimally invasive off-pump procedure  . Cardiac catheterization  11/12/2012    Occluded ostial LAD, patent LIMA-LAD, patent mid LCx stent, 60-70% prox PDA stenosis, mild ascending aortic dilatation w/o dissection or AV insufficiency; unclear culprit lesion- ? cardioembolic vs plaque lysis  . Inguinal hernia  repair    . Hernia repair      Social History [name] reports that he has quit smoking. His smoking use included Cigarettes. He smoked 0.00 packs per day. He has never used smokeless tobacco. [name] reports that he does not drink alcohol.  Review of Systems Stable appetite. No leg edema. No orthopnea or PND. Other systems reviewed and negative except as outlined.  Physical Examination Filed Vitals:   01/18/14 1047  BP: 120/52  Pulse: 60   Filed Weights   01/18/14 1047  Weight: 153 lb (69.4 kg)    Normally nourished appearing elderly male, comfortable at rest.  HEENT: Conjunctiva and lids normal, oropharynx clear with moist mucosa.  Neck: Supple, no elevated JVP or carotid bruits, no thyromegaly.  Lungs: Clear to auscultation, nonlabored breathing at rest.  Cardiac: Irregularly irregular, no S3 2-3/6 systolic murmur c/w AS, no pericardial rub.  Abdomen: Soft, nontender, bowel sounds present, no guarding or rebound.  Extremities: No pitting edema, mild stasis changes, distal pulses 2+.  Skin: Warm and dry.  Musculoskeletal: No kyphosis.  Neuropsychiatric: Alert and oriented x2, affect grossly appropriate.   Problem List and Plan   Aortic stenosis Overall moderate with discrepant measurements based on last echocardiogram. Examination is stable as are symptoms. Patient is not inclined to pursue any further evaluation of this including an echocardiogram. We will continue observation.  Permanent atrial fibrillation Hee is on Xarelto and Toprol-XL.  Mixed hyperlipidemia On Lipitor.    Jonelle SidleSamuel G. Topeka Giammona, M.D., F.A.C.C.

## 2014-01-18 NOTE — Assessment & Plan Note (Signed)
Overall moderate with discrepant measurements based on last echocardiogram. Examination is stable as are symptoms. Patient is not inclined to pursue any further evaluation of this including an echocardiogram. We will continue observation.

## 2014-03-22 ENCOUNTER — Encounter (HOSPITAL_COMMUNITY): Payer: Self-pay | Admitting: Cardiovascular Disease

## 2014-06-11 ENCOUNTER — Encounter (HOSPITAL_COMMUNITY): Payer: Self-pay

## 2014-06-11 ENCOUNTER — Emergency Department (HOSPITAL_COMMUNITY)
Admission: EM | Admit: 2014-06-11 | Discharge: 2014-06-11 | Disposition: A | Discharge status: Home / Self Care | Payer: Medicare Other | Attending: Emergency Medicine | Admitting: Emergency Medicine

## 2014-06-11 ENCOUNTER — Emergency Department (HOSPITAL_COMMUNITY): Payer: Medicare Other

## 2014-06-11 DIAGNOSIS — I10 Essential (primary) hypertension: Secondary | ICD-10-CM | POA: Diagnosis not present

## 2014-06-11 DIAGNOSIS — I2510 Atherosclerotic heart disease of native coronary artery without angina pectoris: Secondary | ICD-10-CM | POA: Diagnosis not present

## 2014-06-11 DIAGNOSIS — Y9389 Activity, other specified: Secondary | ICD-10-CM | POA: Diagnosis not present

## 2014-06-11 DIAGNOSIS — F0390 Unspecified dementia without behavioral disturbance: Secondary | ICD-10-CM | POA: Diagnosis not present

## 2014-06-11 DIAGNOSIS — Z951 Presence of aortocoronary bypass graft: Secondary | ICD-10-CM | POA: Insufficient documentation

## 2014-06-11 DIAGNOSIS — Z9889 Other specified postprocedural states: Secondary | ICD-10-CM | POA: Diagnosis not present

## 2014-06-11 DIAGNOSIS — S0101XA Laceration without foreign body of scalp, initial encounter: Secondary | ICD-10-CM | POA: Diagnosis not present

## 2014-06-11 DIAGNOSIS — Z87891 Personal history of nicotine dependence: Secondary | ICD-10-CM | POA: Insufficient documentation

## 2014-06-11 DIAGNOSIS — I482 Chronic atrial fibrillation: Secondary | ICD-10-CM | POA: Diagnosis not present

## 2014-06-11 DIAGNOSIS — Z8739 Personal history of other diseases of the musculoskeletal system and connective tissue: Secondary | ICD-10-CM | POA: Diagnosis not present

## 2014-06-11 DIAGNOSIS — S0990XA Unspecified injury of head, initial encounter: Secondary | ICD-10-CM | POA: Diagnosis present

## 2014-06-11 DIAGNOSIS — Y998 Other external cause status: Secondary | ICD-10-CM | POA: Insufficient documentation

## 2014-06-11 DIAGNOSIS — I252 Old myocardial infarction: Secondary | ICD-10-CM | POA: Insufficient documentation

## 2014-06-11 DIAGNOSIS — E785 Hyperlipidemia, unspecified: Secondary | ICD-10-CM | POA: Insufficient documentation

## 2014-06-11 DIAGNOSIS — Z7901 Long term (current) use of anticoagulants: Secondary | ICD-10-CM | POA: Diagnosis not present

## 2014-06-11 DIAGNOSIS — W01198A Fall on same level from slipping, tripping and stumbling with subsequent striking against other object, initial encounter: Secondary | ICD-10-CM | POA: Diagnosis not present

## 2014-06-11 DIAGNOSIS — E119 Type 2 diabetes mellitus without complications: Secondary | ICD-10-CM | POA: Diagnosis not present

## 2014-06-11 DIAGNOSIS — Z79899 Other long term (current) drug therapy: Secondary | ICD-10-CM | POA: Insufficient documentation

## 2014-06-11 DIAGNOSIS — Y9289 Other specified places as the place of occurrence of the external cause: Secondary | ICD-10-CM | POA: Diagnosis not present

## 2014-06-11 DIAGNOSIS — Z880 Allergy status to penicillin: Secondary | ICD-10-CM | POA: Insufficient documentation

## 2014-06-11 DIAGNOSIS — W19XXXA Unspecified fall, initial encounter: Secondary | ICD-10-CM

## 2014-06-11 HISTORY — DX: Type 2 diabetes mellitus without complications: E11.9

## 2014-06-11 HISTORY — DX: ST elevation (STEMI) myocardial infarction of unspecified site: I21.3

## 2014-06-11 HISTORY — DX: Unspecified dementia, unspecified severity, without behavioral disturbance, psychotic disturbance, mood disturbance, and anxiety: F03.90

## 2014-06-11 LAB — BASIC METABOLIC PANEL
Anion gap: 3 — ABNORMAL LOW (ref 5–15)
BUN: 23 mg/dL (ref 6–23)
CHLORIDE: 105 mmol/L (ref 96–112)
CO2: 30 mmol/L (ref 19–32)
Calcium: 8 mg/dL — ABNORMAL LOW (ref 8.4–10.5)
Creatinine, Ser: 1.28 mg/dL (ref 0.50–1.35)
GFR calc Af Amer: 56 mL/min — ABNORMAL LOW (ref >90)
GFR calc non Af Amer: 48 mL/min — ABNORMAL LOW (ref >90)
Glucose, Bld: 100 mg/dL — ABNORMAL HIGH (ref 70–99)
Potassium: 4.7 mmol/L (ref 3.5–5.1)
SODIUM: 138 mmol/L (ref 135–145)

## 2014-06-11 LAB — CBC WITH DIFFERENTIAL/PLATELET
BASOS PCT: 0 % (ref 0–1)
Basophils Absolute: 0 10*3/uL (ref 0.0–0.1)
EOS ABS: 0.3 10*3/uL (ref 0.0–0.7)
EOS PCT: 4 % (ref 0–5)
HCT: 38.6 % — ABNORMAL LOW (ref 39.0–52.0)
Hemoglobin: 13 g/dL (ref 13.0–17.0)
Lymphocytes Relative: 20 % (ref 12–46)
Lymphs Abs: 1.2 10*3/uL (ref 0.7–4.0)
MCH: 34 pg (ref 26.0–34.0)
MCHC: 33.7 g/dL (ref 30.0–36.0)
MCV: 101 fL — AB (ref 78.0–100.0)
MONO ABS: 0.5 10*3/uL (ref 0.1–1.0)
MONOS PCT: 8 % (ref 3–12)
NEUTROS PCT: 68 % (ref 43–77)
Neutro Abs: 4 10*3/uL (ref 1.7–7.7)
Platelets: 153 10*3/uL (ref 150–400)
RBC: 3.82 MIL/uL — ABNORMAL LOW (ref 4.22–5.81)
RDW: 13.7 % (ref 11.5–15.5)
WBC: 5.9 10*3/uL (ref 4.0–10.5)

## 2014-06-11 LAB — CBG MONITORING, ED: Glucose-Capillary: 91 mg/dL (ref 70–99)

## 2014-06-11 MED ORDER — LIDOCAINE HCL (PF) 1 % IJ SOLN
INTRAMUSCULAR | Status: AC
  Filled 2014-06-11: qty 5

## 2014-06-11 NOTE — ED Provider Notes (Signed)
CSN: [id number]     Arrival date & time 06/11/14  1610 History   First MD Initiated Contact with Patient 06/11/14 1622     Chief Complaint  Patient presents with  . Fall     HPI  She presents evaluation of a scalp laceration. He is at an extended care facility. He had a fall a few hours ago. He denies any symptoms prior to 12. No reported syncope.  Frequent becomes dizzy. His headache. States he feels "fine" denies symptoms. States he can "kind of" feel the laceration on the right parietal scalp.  Past Medical History  Diagnosis Date  . Aortic stenosis     Probable moderate with discordant gradient (mild) and area (severe)  . Essential hypertension, benign   . Hyperlipidemia   . Gout   . Coronary atherosclerosis of native coronary artery     a. S/P CABG 1998,  b. PCI to CFX with BMS 2008 => ISR => s/p Cypher DES + POBA;  c. inf STEMI 8/14: LHC 11/12/12:  LAD occluded at the origin, mid CFX stent patent, proximal PDA 60-70%, LIMA-LAD patent. Unclear culprit for his inferior STEMI => med Rx (? embolic from AFib)  . Permanent atrial fibrillation     Coumadin restarted 11/2012  . STEMI (ST elevation myocardial infarction)   . Diabetes mellitus without complication   . Dementia    Past Surgical History  Procedure Laterality Date  . Coronary artery bypass graft  09/07/1996    LIMA TO LAD - minimally invasive off-pump procedure  . Cardiac catheterization  11/12/2012    Occluded ostial LAD, patent LIMA-LAD, patent mid LCx stent, 60-70% prox PDA stenosis, mild ascending aortic dilatation w/o dissection or AV insufficiency; unclear culprit lesion- ? cardioembolic vs plaque lysis  . Inguinal hernia repair    . Hernia repair    . Aortogram  11/12/2012    Procedure: AORTOGRAM;  Surgeon: Runell Gess, MD;  Location: [facility] CATH LAB;  Service: Cardiovascular;;   Family History  Problem Relation Age of Onset  . Stroke Mother   . Stroke Father   . Other Brother 11    MI   History  Substance  Use Topics  . Smoking status: Former Smoker    Types: Cigarettes  . Smokeless tobacco: Never Used     Comment: smoked from age [age] and then quit  . Alcohol Use: No    Review of Systems  Constitutional: Negative for fever, chills, diaphoresis, appetite change and fatigue.  HENT: Negative for mouth sores, sore throat and trouble swallowing.   Eyes: Negative for visual disturbance.  Respiratory: Negative for cough, chest tightness, shortness of breath and wheezing.   Cardiovascular: Negative for chest pain.  Gastrointestinal: Negative for nausea, vomiting, abdominal pain, diarrhea and abdominal distention.  Endocrine: Negative for polydipsia, polyphagia and polyuria.  Genitourinary: Negative for dysuria, frequency and hematuria.  Musculoskeletal: Negative for gait problem.  Skin: Negative for color change, pallor and rash.  Neurological: Negative for dizziness, syncope, light-headedness and headaches.  Hematological: Does not bruise/bleed easily.  Psychiatric/Behavioral: Negative for behavioral problems and confusion.      Allergies  Penicillins  Home Medications   Prior to Admission medications   Medication Sig Start Date End Date Taking? Authorizing Provider  acetaminophen (TYLENOL) 325 MG tablet Take 650 mg by mouth every 6 (six) hours.    Yes Historical Provider, MD  atorvastatin (LIPITOR) 20 MG tablet Take 20 mg by mouth daily.   Yes Historical Provider, MD  divalproex (DEPAKOTE) 125 MG DR tablet Take 125 mg by mouth at bedtime.   Yes Historical Provider, MD  donepezil (ARICEPT) 10 MG tablet Take 10 mg by mouth at bedtime.   Yes Historical Provider, MD  Melatonin 3 MG TABS Take 1 tablet by mouth at bedtime.   Yes Historical Provider, MD  metoprolol succinate (TOPROL-XL) 25 MG 24 hr tablet Take 1.5 tablets (37.5 mg total) by mouth daily. Take with or immediately following a meal. 11/21/12  Yes Roger A Arguello, PA-C  mineral oil liquid Take by mouth once a week. *Instill one  drop into each ear once weekly   Yes Historical Provider, MD  nitroGLYCERIN (NITROSTAT) 0.4 MG SL tablet Place 1 tablet (0.4 mg total) under the tongue every 5 (five) minutes as needed for chest pain. 11/21/12  Yes Roger A Arguello, PA-C  Olopatadine HCl (PATADAY) 0.2 % SOLN Place 2 drops into both eyes daily.   Yes Historical Provider, MD  Polyvinyl Alcohol-Povidone (REFRESH OP) Place 1 drop into the left eye 2 (two) times daily as needed (dry or itching eyes.).    Yes Historical Provider, MD  Rivaroxaban (XARELTO) 15 MG TABS tablet Take 15 mg by mouth every evening.    Yes Historical Provider, MD  trandolapril (MAVIK) 2 MG tablet Take 2 mg by mouth daily.   Yes Historical Provider, MD  Nutritional Supplements (ENSURE HIGH PROTEIN) LIQD Take 1 Can by mouth 2 (two) times daily.    Historical Provider, MD   BP 157/96 mmHg  Pulse 75  Temp(Src) 97.8 F (36.6 C) (Oral)  Resp 19  Ht 5\' 6"  (1.676 m)  Wt 153 lb (69.4 kg)  BMI 24.71 kg/m2  SpO2 95% Physical Exam  Constitutional: He is oriented to person, place, and time. He appears well-developed and well-nourished. No distress.  HENT:  Head: Normocephalic.    Eyes: Conjunctivae are normal. Pupils are equal, round, and reactive to light. No scleral icterus.  Neck: Normal range of motion. Neck supple. No thyromegaly present.  Cardiovascular: Normal rate and regular rhythm.  Exam reveals no gallop and no friction rub.   No murmur heard. Pulmonary/Chest: Effort normal and breath sounds normal. No respiratory distress. He has no wheezes. He has no rales.  Abdominal: Soft. Bowel sounds are normal. He exhibits no distension. There is no tenderness. There is no rebound.  Musculoskeletal: Normal range of motion.  Neurological: He is alert and oriented to person, place, and time.  Skin: Skin is warm and dry. No rash noted.  Psychiatric: He has a normal mood and affect. His behavior is normal.    ED Course  Procedures (including critical care  time) Labs Review Labs Reviewed  CBC WITH DIFFERENTIAL/PLATELET - Abnormal; Notable for the following:    RBC 3.82 (*)    HCT 38.6 (*)    MCV 101.0 (*)    All other components within normal limits  BASIC METABOLIC PANEL - Abnormal; Notable for the following:    Glucose, Bld 100 (*)    Calcium 8.0 (*)    GFR calc non Af Amer 48 (*)    GFR calc Af Amer 56 (*)    Anion gap 3 (*)    All other components within normal limits  URINALYSIS, ROUTINE W REFLEX MICROSCOPIC  CBG MONITORING, ED    Imaging Review Ct Head Wo Contrast  06/11/2014   CLINICAL DATA:  Fall, right-sided head trauma, laceration of scalp, headache  EXAM: CT HEAD WITHOUT CONTRAST  CT CERVICAL SPINE WITHOUT  CONTRAST  TECHNIQUE: Multidetector CT imaging of the head and cervical spine was performed following the standard protocol without intravenous contrast. Multiplanar CT image reconstructions of the cervical spine were also generated.  COMPARISON:  12/28/2012  FINDINGS: CT HEAD FINDINGS  Mild cortical volume loss noted with proportional ventricular prominence. Areas of periventricular white matter hypodensity are most compatible with small vessel ischemic change. No acute hemorrhage, infarct, or mass lesion is identified. Right parieto-occipital scalp laceration/hematoma identified. Evidence of scleral banding identified bilaterally. Minimal right frontal scalp swelling is identified.  CT CERVICAL SPINE FINDINGS  C1 through the cervicothoracic junction is visualized in its entirety. No precervical soft tissue widening. Flowing anterior osteophytosis likely indicate diffuse idiopathic skeletal hyperostosis. Vacuum disc phenomenon with osteophytosis is identified at C3-C4 and C4-C5. Anterior fat planes are preserved. No fracture or dislocation is identified. Disc degenerative change is identified at C4-C5. Broad-based disc herniation with partial ossification is identified at C3-C4. Minimal mass effect upon the canal by this and C4-C5  degenerative change is identified.  IMPRESSION: Right parieto-occipital scalp laceration without acute underlying intracranial abnormality.  Cervical spine disc degenerative change.   Electronically Signed   By: Christiana Pellant M.D.   On: 06/11/2014 17:57   Ct Cervical Spine Wo Contrast  06/11/2014   CLINICAL DATA:  Fall, right-sided head trauma, laceration of scalp, headache  EXAM: CT HEAD WITHOUT CONTRAST  CT CERVICAL SPINE WITHOUT CONTRAST  TECHNIQUE: Multidetector CT imaging of the head and cervical spine was performed following the standard protocol without intravenous contrast. Multiplanar CT image reconstructions of the cervical spine were also generated.  COMPARISON:  12/28/2012  FINDINGS: CT HEAD FINDINGS  Mild cortical volume loss noted with proportional ventricular prominence. Areas of periventricular white matter hypodensity are most compatible with small vessel ischemic change. No acute hemorrhage, infarct, or mass lesion is identified. Right parieto-occipital scalp laceration/hematoma identified. Evidence of scleral banding identified bilaterally. Minimal right frontal scalp swelling is identified.  CT CERVICAL SPINE FINDINGS  C1 through the cervicothoracic junction is visualized in its entirety. No precervical soft tissue widening. Flowing anterior osteophytosis likely indicate diffuse idiopathic skeletal hyperostosis. Vacuum disc phenomenon with osteophytosis is identified at C3-C4 and C4-C5. Anterior fat planes are preserved. No fracture or dislocation is identified. Disc degenerative change is identified at C4-C5. Broad-based disc herniation with partial ossification is identified at C3-C4. Minimal mass effect upon the canal by this and C4-C5 degenerative change is identified.  IMPRESSION: Right parieto-occipital scalp laceration without acute underlying intracranial abnormality.  Cervical spine disc degenerative change.   Electronically Signed   By: Christiana Pellant M.D.   On: 06/11/2014 17:57       EKG Interpretation   Date/Time:  Monday June 11 2014 16:13:50 EST Ventricular Rate:  58 PR Interval:    QRS Duration: 139 QT Interval:  406 QTC Calculation: 399 R Axis:   -61 Text Interpretation:  Atrial fibrillation RBBB and LAFB Confirmed by Fayrene Fearing   MD, Levern Kalka ([id number]) on 06/11/2014 4:44:42 PM     LACERATION REPAIR Performed by: Claudean Kinds Authorized by: Claudean Kinds Consent: Verbal consent obtained. Risks and benefits: risks, benefits and alternatives were discussed Consent given by: patient Patient identity confirmed: provided demographic data Prepped and Draped in normal sterile fashion Wound explored  Laceration Location: Right scalp  Laceration Length: 4cm  No Foreign Bodies seen or palpated  Anesthesia: local infiltration  Local anesthetic: lidocaine 1% s epinephrine  Anesthetic total: 4 ml  Irrigation method: syringe Amount of cleaning: standard  Skin closure: Staples  Number of sutures: 5  Technique: simple  Patient tolerance: Patient tolerated the procedure well with no immediate complications.  MDM   Final diagnoses:  Fall    Patient with normal interaction with family. No acute abnormalities intracranially on CT. Laceration was repaired. Plan is discharge back to his care facility. Hold Xarelto 24 hours. Suture removal 10 days.    Rolland Porter, MD 06/11/14 [phone]

## 2014-06-11 NOTE — Discharge Instructions (Signed)
Staple removal in 10 days. Do not give Xarelto for 24 hours, then resume.   Laceration Care, Adult A laceration is a cut or lesion that goes through all layers of the skin and into the tissue just beneath the skin. TREATMENT  Some lacerations may not require closure. Some lacerations may not be able to be closed due to an increased risk of infection. It is important to see your caregiver as soon as possible after an injury to minimize the risk of infection and maximize the opportunity for successful closure. If closure is appropriate, pain medicines may be given, if needed. The wound will be cleaned to help prevent infection. Your caregiver will use stitches (sutures), staples, wound glue (adhesive), or skin adhesive strips to repair the laceration. These tools bring the skin edges together to allow for faster healing and a better cosmetic outcome. However, all wounds will heal with a scar. Once the wound has healed, scarring can be minimized by covering the wound with sunscreen during the day for 1 full year. HOME CARE INSTRUCTIONS  For sutures or staples:  Keep the wound clean and dry.  If you were given a bandage (dressing), you should change it at least once a day. Also, change the dressing if it becomes wet or dirty, or as directed by your caregiver.  Wash the wound with soap and water 2 times a day. Rinse the wound off with water to remove all soap. Pat the wound dry with a clean towel.  After cleaning, apply a thin layer of the antibiotic ointment as recommended by your caregiver. This will help prevent infection and keep the dressing from sticking.  You may shower as usual after the first 24 hours. Do not soak the wound in water until the sutures are removed.  Only take over-the-counter or prescription medicines for pain, discomfort, or fever as directed by your caregiver.  Get your sutures or staples removed as directed by your caregiver. For skin adhesive strips:  Keep the wound  clean and dry.  Do not get the skin adhesive strips wet. You may bathe carefully, using caution to keep the wound dry.  If the wound gets wet, pat it dry with a clean towel.  Skin adhesive strips will fall off on their own. You may trim the strips as the wound heals. Do not remove skin adhesive strips that are still stuck to the wound. They will fall off in time. For wound adhesive:  You may briefly wet your wound in the shower or bath. Do not soak or scrub the wound. Do not swim. Avoid periods of heavy perspiration until the skin adhesive has fallen off on its own. After showering or bathing, gently pat the wound dry with a clean towel.  Do not apply liquid medicine, cream medicine, or ointment medicine to your wound while the skin adhesive is in place. This may loosen the film before your wound is healed.  If a dressing is placed over the wound, be careful not to apply tape directly over the skin adhesive. This may cause the adhesive to be pulled off before the wound is healed.  Avoid prolonged exposure to sunlight or tanning lamps while the skin adhesive is in place. Exposure to ultraviolet light in the first year will darken the scar.  The skin adhesive will usually remain in place for 5 to 10 days, then naturally fall off the skin. Do not pick at the adhesive film. You may need a tetanus shot if:  You  cannot remember when you had your last tetanus shot.  You have never had a tetanus shot. If you get a tetanus shot, your arm may swell, get red, and feel warm to the touch. This is common and not a problem. If you need a tetanus shot and you choose not to have one, there is a rare chance of getting tetanus. Sickness from tetanus can be serious. SEEK MEDICAL CARE IF:   You have redness, swelling, or increasing pain in the wound.  You see a red line that goes away from the wound.  You have yellowish-white fluid (pus) coming from the wound.  You have a fever.  You notice a bad smell  coming from the wound or dressing.  Your wound breaks open before or after sutures have been removed.  You notice something coming out of the wound such as wood or glass.  Your wound is on your hand or foot and you cannot move a finger or toe. SEEK IMMEDIATE MEDICAL CARE IF:   Your pain is not controlled with prescribed medicine.  You have severe swelling around the wound causing pain and numbness or a change in color in your arm, hand, leg, or foot.  Your wound splits open and starts bleeding.  You have worsening numbness, weakness, or loss of function of any joint around or beyond the wound.  You develop painful lumps near the wound or on the skin anywhere on your body. MAKE SURE YOU:   Understand these instructions.  Will watch your condition.  Will get help right away if you are not doing well or get worse. Document Released: 03/30/2005 Document Revised: 06/22/2011 Document Reviewed: 09/23/2010 [facility] Patient Information 2015 [place], [place]. This information is not intended to replace advice given to you by your health care provider. Make sure you discuss any questions you have with your health care provider.

## 2014-06-11 NOTE — ED Notes (Signed)
MD at bedside. 

## 2014-06-11 NOTE — ED Notes (Signed)
Pt reports felt dizzy this morning and fell.  Says hit head on the floor.  Has laceration to r side of head.  Denies any LOC.  Pt pleasant, alert, and oriented to person, place, and time.   C/O headache.  Denies any chest pain, n/v or sob.

## 2015-03-22 ENCOUNTER — Ambulatory Visit (INDEPENDENT_AMBULATORY_CARE_PROVIDER_SITE_OTHER): Payer: Medicare Other | Admitting: Cardiology

## 2015-03-22 ENCOUNTER — Encounter: Payer: Self-pay | Admitting: Cardiology

## 2015-03-22 VITALS — BP 130/70 | HR 84 | Ht 69.0 in | Wt 138.8 lb

## 2015-03-22 DIAGNOSIS — I482 Chronic atrial fibrillation, unspecified: Secondary | ICD-10-CM

## 2015-03-22 DIAGNOSIS — I2510 Atherosclerotic heart disease of native coronary artery without angina pectoris: Secondary | ICD-10-CM | POA: Diagnosis not present

## 2015-03-22 DIAGNOSIS — I350 Nonrheumatic aortic (valve) stenosis: Secondary | ICD-10-CM | POA: Diagnosis not present

## 2015-03-22 NOTE — Patient Instructions (Addendum)
Your physician wants you to follow-up in: 6 months with Dr Randa SpikeMcDowell You will receive a reminder letter in the mail two months in advance. If you don't receive a letter, please call our office to schedule the follow-up appointment.  Your physician recommends that you continue on your current medications as directed. Please refer to the Current Medication list given to you today.   If you need a refill on your cardiac medications before your next appointment, please call your pharmacy.  Your physician recommends that you return for lab work in: now CBC,BMET lab slips enclosed   Thank you for choosing [facility] !

## 2015-03-22 NOTE — Progress Notes (Signed)
Cardiology Office Note  Date: 03/22/2015   ID: Aaron [name], DOB November 19, 1923, MRN [id number]  PCP: Colette Ribas, MD  Primary Cardiologist: [name], MD   Chief Complaint  Patient presents with  . Atrial Fibrillation  . Aortic Stenosis   History of Present Illness: Aaron [name] is an 81 y.o. male last seen in October 2015. He is here today with his ex-wife for a follow-up visit. He continues to reside in [place]. He provides fairly limited historical information, does not indicate any recent chest pain or breathlessness. I reviewed his medications which are outlined below, and overall stable from a cardiac perspective. He does not endorse any spontaneous bleeding problems on Xarelto. His last lab work was back in February.  We are conservatively managing his aortic stenosis based on discussion at previous visit. He does not want to pursue follow-up imaging.  Blood pressure today is adequately controlled on lisinopril and Toprol-XL.  Past Medical History  Diagnosis Date  . Aortic stenosis     Probable moderate with discordant gradient (mild) and area (severe)  . Essential hypertension, benign   . Hyperlipidemia   . Gout   . Coronary atherosclerosis of native coronary artery     a. S/P CABG 1998,  b. PCI to CFX with BMS 2008 => ISR => s/p Cypher DES + POBA;  c. inf STEMI 8/14: LHC 11/12/12:  LAD occluded at the origin, mid CFX stent patent, proximal PDA 60-70%, LIMA-LAD patent. Unclear culprit for his inferior STEMI => med Rx (? embolic from AFib)  . Permanent atrial fibrillation (HCC)     Coumadin restarted 11/2012  . STEMI (ST elevation myocardial infarction) (HCC)   . Diabetes mellitus without complication (HCC)   . Dementia     Current Outpatient Prescriptions  Medication Sig Dispense Refill  . acetaminophen (TYLENOL) 325 MG tablet Take 650 mg by mouth every 6 (six) hours.     . ALPRAZolam (XANAX) 0.25 MG tablet Take 0.25 mg by mouth 3 (three)  times daily as needed for anxiety.    Marland Kitchen atorvastatin (LIPITOR) 20 MG tablet Take 20 mg by mouth daily.    . divalproex (DEPAKOTE) 125 MG DR tablet Take 125 mg by mouth at bedtime.    . donepezil (ARICEPT) 10 MG tablet Take 10 mg by mouth at bedtime.    Marland Kitchen lisinopril (PRINIVIL,ZESTRIL) 10 MG tablet Take 10 mg by mouth daily.    Marland Kitchen loratadine (CLARITIN) 10 MG tablet Take 10 mg by mouth daily.    . Melatonin 3 MG TABS Take 1 tablet by mouth at bedtime.    . memantine (NAMENDA) 5 MG tablet Take 5 mg by mouth 2 (two) times daily.    . metoprolol succinate (TOPROL-XL) 25 MG 24 hr tablet Take 1.5 tablets (37.5 mg total) by mouth daily. Take with or immediately following a meal. 45 tablet 3  . mineral oil liquid Take by mouth once a week. *Instill one drop into each ear once weekly    . nitroGLYCERIN (NITROSTAT) 0.4 MG SL tablet Place 1 tablet (0.4 mg total) under the tongue every 5 (five) minutes as needed for chest pain. 25 tablet 3  . Nutritional Supplements (ENSURE HIGH PROTEIN) LIQD Take 1 Can by mouth 2 (two) times daily.    . Olopatadine HCl (PATADAY) 0.2 % SOLN Place 2 drops into both eyes daily.    . Polyvinyl Alcohol-Povidone (REFRESH OP) Place 1 drop into the left eye 2 (two) times daily as needed (  dry or itching eyes.).     Marland Kitchen. Rivaroxaban (XARELTO) 15 MG TABS tablet Take 15 mg by mouth every evening.      No current facility-administered medications for this visit.   Allergies:  Penicillins   Social History: The patient  reports that he has quit smoking. His smoking use included Cigarettes. He has never used smokeless tobacco. He reports that he does not drink alcohol or use illicit drugs.   ROS:  Please see the history of present illness. Otherwise, complete review of systems is positive for decreased hearing.  All other systems are reviewed and negative.   Physical Exam: VS:  BP 130/70 mmHg  Pulse 84  Ht 5\' 9"  (1.753 m)  Wt 138 lb 12.8 oz (62.959 kg)  BMI 20.49 kg/m2  SpO2 95%, BMI  Body mass index is 20.49 kg/(m^2).  Wt Readings from Last 3 Encounters:  03/22/15 138 lb 12.8 oz (62.959 kg)  06/11/14 153 lb (69.4 kg)  01/18/14 153 lb (69.4 kg)    Normally nourished appearing elderly male, comfortable at rest.  HEENT: Conjunctiva and lids normal, oropharynx clear with moist mucosa.  Neck: Supple, no elevated JVP or carotid bruits, no thyromegaly.  Lungs: Clear to auscultation, nonlabored breathing at rest.  Cardiac: Irregularly irregular, no S3 2-3/6 systolic murmur c/w AS, no pericardial rub.  Abdomen: Soft, nontender, bowel sounds present, no guarding or rebound.  Extremities: No pitting edema, mild stasis changes, distal pulses 2+.   ECG: Tracing from 06/11/2014 showed rate-controlled atrial fibrillation with right bundle branch block and left anterior fascicular block.  Recent Labwork: 06/11/2014: BUN 23; Creatinine, Ser 1.28; Hemoglobin 13.0; Platelets 153; Potassium 4.7; Sodium 138   Other Studies Reviewed Today:  Echocardiogram 11/14/2012: Study Conclusions  - Left ventricle: The cavity size was normal. Wall thickness was increased in a pattern of mild LVH. Systolic function was normal. The estimated ejection fraction was in the range of 60% to 65%. Wall motion was normal; there were no regional wall motion abnormalities. Indeterminant diastolic function (atrial fibrillation). - Aortic valve: Trileaflet; moderately calcified leaflets. Trivial regurgitation. Mild AS by mean gradient, severe by calculated valve area. Suspect moderate visually. Mean gradient: 16mm Hg (S). Peak gradient: 30mm Hg (S). Valve area: 0.8cm^2(VTI). - Mitral valve: Mild regurgitation. - Left atrium: The atrium was moderately dilated. - Right ventricle: The cavity size was mildly dilated. Systolic function was normal. - Right atrium: The atrium was moderately dilated. - Tricuspid valve: Mild-moderate regurgitation. Peak RV-RA gradient: 30mm Hg (S). -  Pulmonary arteries: PA peak pressure: 40mm Hg (S). - Systemic veins: IVC measured 1.9 cm with some respirophasic variation, suggesting RA pressure 10 mmHg. Impressions:  - The patient was in atrial fibrillation. Normal LV size with mild LV hypertrophy. EF 60-65%. Mildly dilated RV with normal systolic function. Aortic stenosis was probably moderate (mild by gradient, severe by calculated valve area, visually moderate range). If there is a question of severity, would consider TEE. Mild mitral regurgitation, mild to moderate TR, mild pulmonary hypertension.  Assessment and Plan:  1. Chronic atrial fibrillation. Continue strategy of heart rate control with Toprol-XL and anticoagulation with Xarelto. He will have follow-up CBC and BMET.  2. Multivessel CAD status post CABG, no active angina symptoms.  3. Aortic stenosis, moderate range as of 2014. As per prior discussion, conservative approach adopted. No further imaging studies are being obtained at this time.  Current medicines were reviewed with the patient today.   Orders Placed This Encounter  Procedures  .  CBC  . Basic Metabolic Panel (BMET)    Disposition: FU with me in 6 months.   Signed, Jonelle Sidle, MD, [facility] 03/22/2015 11:57 AM    [facility] [address], [place], [place] [address] Phone: [phone]; Fax: [phone]

## 2015-03-28 LAB — CBC
HEMATOCRIT: 37.5 % — AB (ref 39.0–52.0)
HEMOGLOBIN: 12.6 g/dL — AB (ref 13.0–17.0)
MCH: 33.3 pg (ref 26.0–34.0)
MCHC: 33.6 g/dL (ref 30.0–36.0)
MCV: 99.2 fL (ref 78.0–100.0)
MPV: 11.2 fL (ref 8.6–12.4)
Platelets: 203 10*3/uL (ref 150–400)
RBC: 3.78 MIL/uL — ABNORMAL LOW (ref 4.22–5.81)
RDW: 15 % (ref 11.5–15.5)
WBC: 8.3 10*3/uL (ref 4.0–10.5)

## 2015-03-29 LAB — BASIC METABOLIC PANEL
BUN: 22 mg/dL (ref 7–25)
CO2: 27 mmol/L (ref 20–31)
Calcium: 8.5 mg/dL — ABNORMAL LOW (ref 8.6–10.3)
Chloride: 106 mmol/L (ref 98–110)
Creat: 1.12 mg/dL — ABNORMAL HIGH (ref 0.70–1.11)
Glucose, Bld: 90 mg/dL (ref 65–99)
POTASSIUM: 4.4 mmol/L (ref 3.5–5.3)
Sodium: 142 mmol/L (ref 135–146)

## 2015-04-04 ENCOUNTER — Ambulatory Visit (INDEPENDENT_AMBULATORY_CARE_PROVIDER_SITE_OTHER): Payer: Medicare Other | Admitting: Otolaryngology

## 2015-04-04 DIAGNOSIS — H6123 Impacted cerumen, bilateral: Secondary | ICD-10-CM | POA: Diagnosis not present

## 2015-04-04 DIAGNOSIS — H900 Conductive hearing loss, bilateral: Secondary | ICD-10-CM

## 2015-04-04 IMAGING — CR DG CHEST 1V PORT
1 series · 1 of 1 positions shown · non-contrast
Comparison: 12/06/2008

CLINICAL DATA: Chest pain.

PORTABLE CHEST - 1 VIEW

[portable]
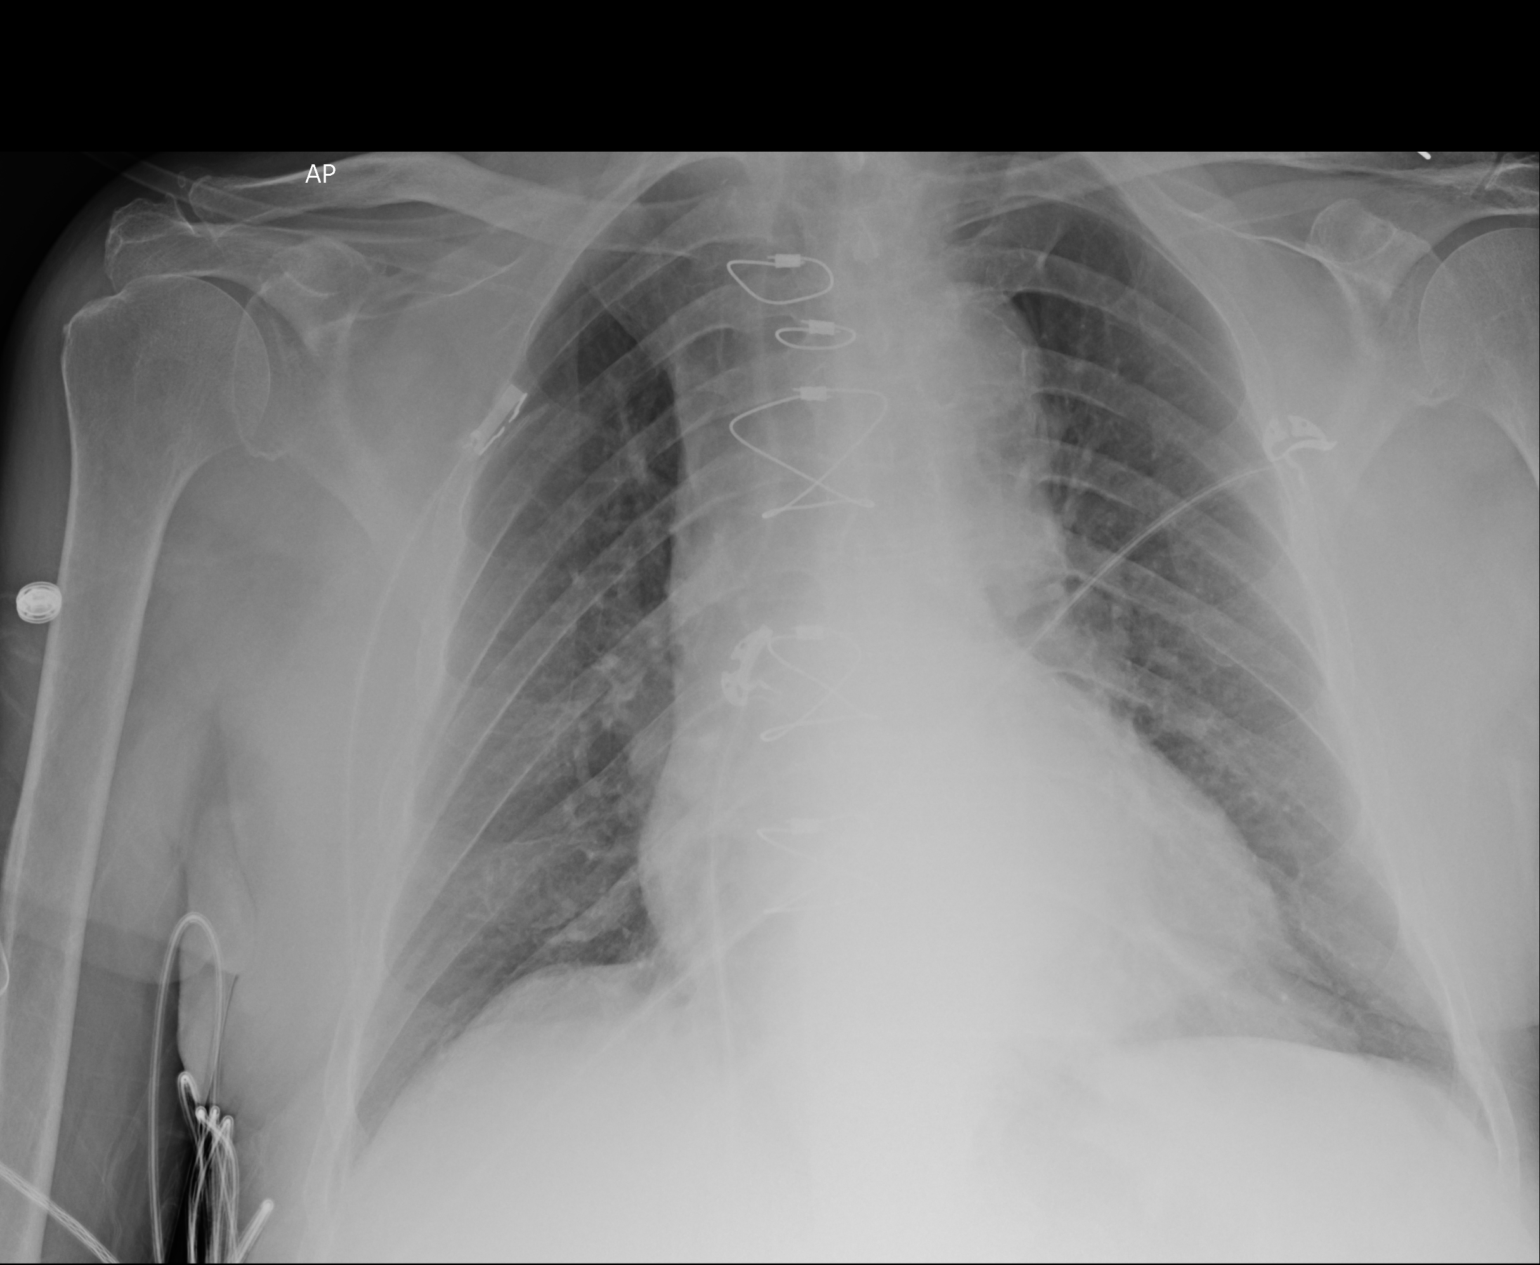

[1 of 1 positions shown; findings below may reference images not displayed]

FINDINGS: Prior CABG.  Heart is borderline in size.  Tortuosity and
ectasia of the thoracic aorta.  No confluent opacities or
effusions.  No acute bony abnormality.
IMPRESSION: Borderline heart size.  No acute cardiopulmonary disease.

## 2015-04-16 IMAGING — CR DG CHEST 2V
2 series · 2 of 2 positions shown · non-contrast
Comparison: November 12, 2012.

CLINICAL DATA: Hypoxia

CHEST - 2 VIEW

[view not recorded (1 of 2)]
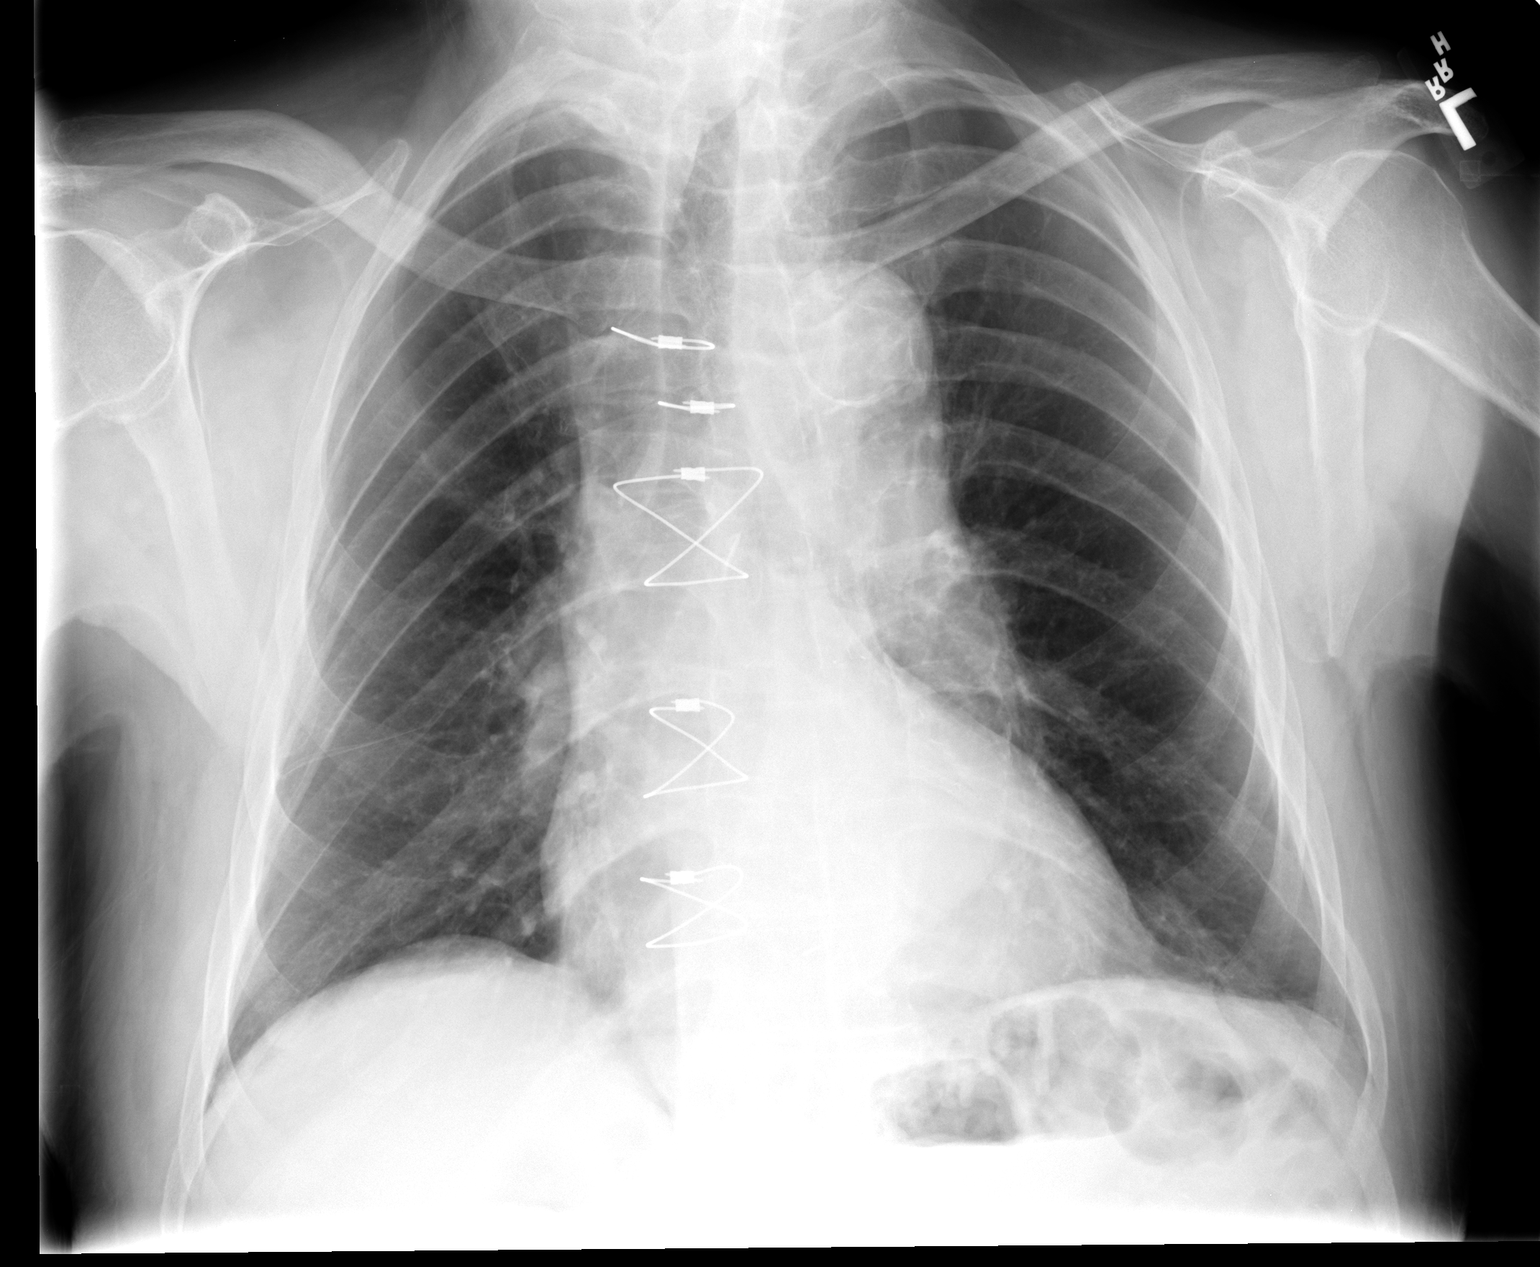

[view not recorded (2 of 2)]
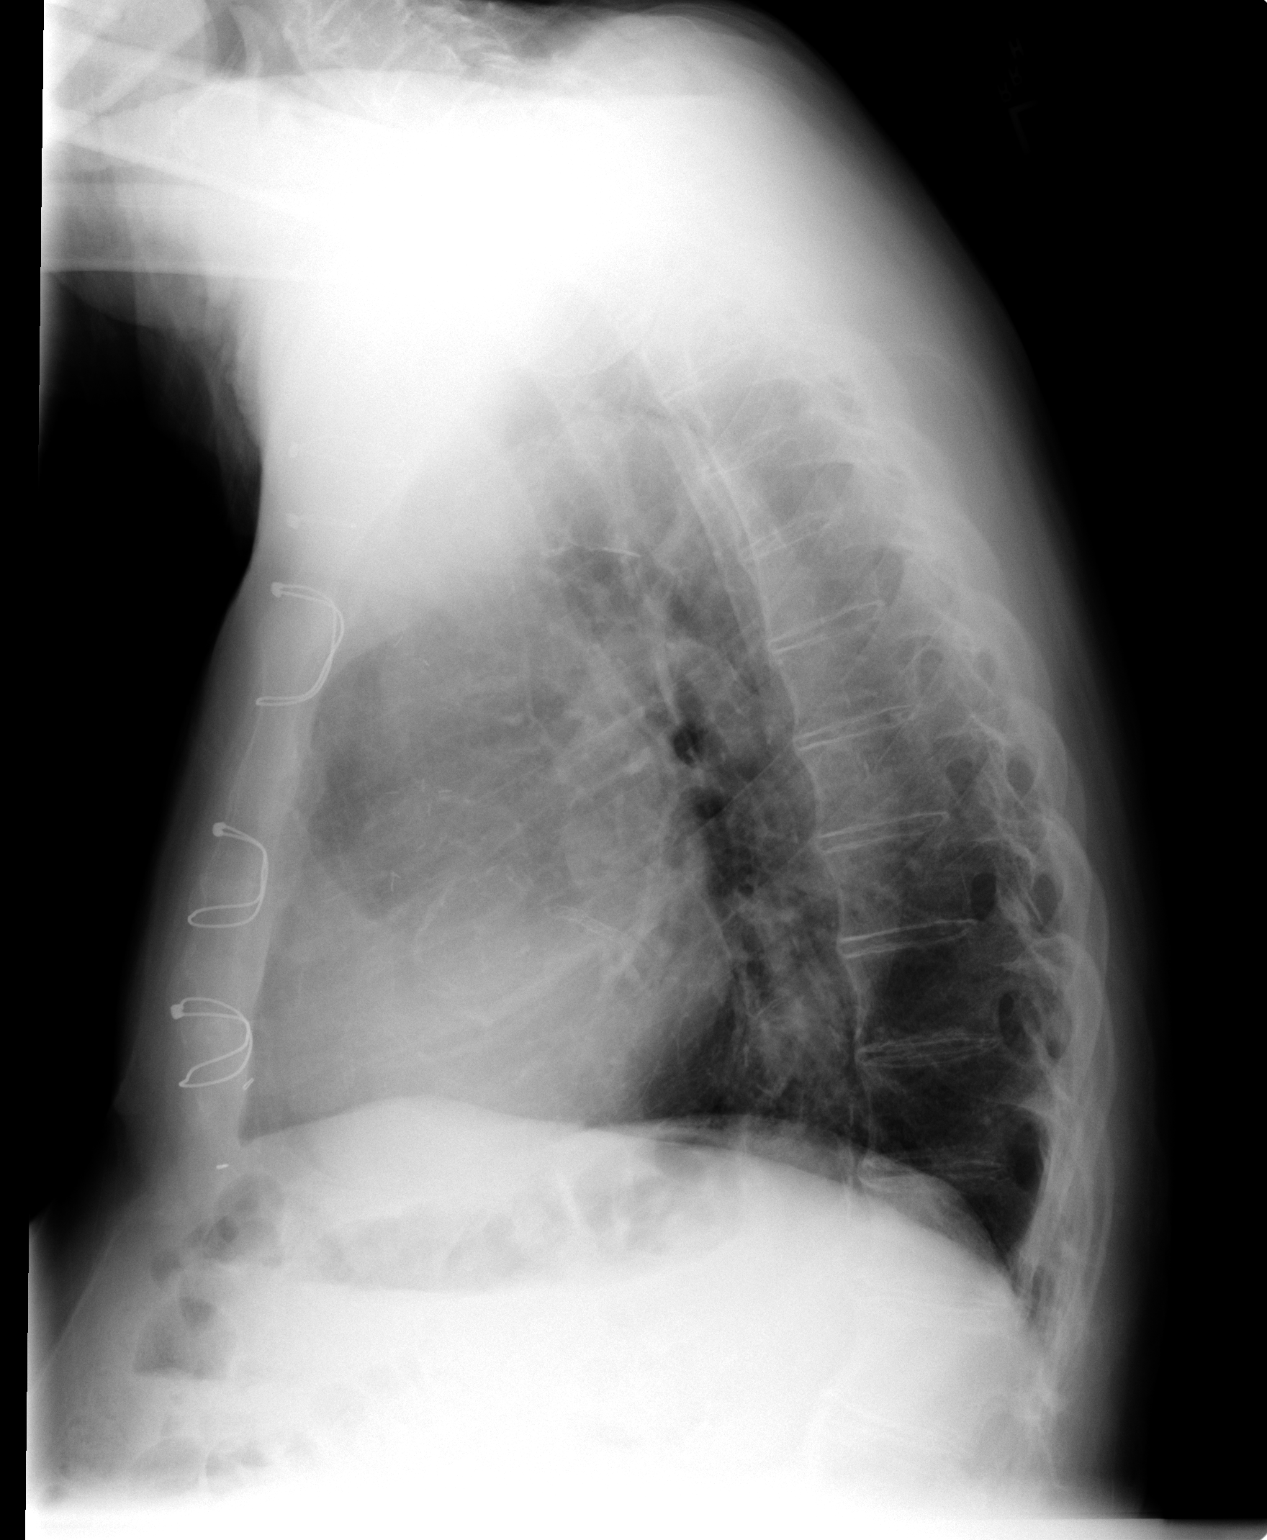

[2 of 2 positions shown; findings below may reference images not displayed]

FINDINGS: Stable cardiomegaly.  Sternotomy wires are noted.  No
acute pulmonary disease is noted.  No pneumothorax or pleural
effusion is noted.
IMPRESSION: No acute cardiopulmonary abnormality seen.

## 2015-04-25 ENCOUNTER — Ambulatory Visit (INDEPENDENT_AMBULATORY_CARE_PROVIDER_SITE_OTHER): Payer: Medicare Other | Admitting: Otolaryngology

## 2015-04-25 DIAGNOSIS — H6123 Impacted cerumen, bilateral: Secondary | ICD-10-CM | POA: Diagnosis not present

## 2015-04-25 DIAGNOSIS — H903 Sensorineural hearing loss, bilateral: Secondary | ICD-10-CM

## 2015-06-01 ENCOUNTER — Encounter (HOSPITAL_COMMUNITY): Payer: Self-pay | Admitting: Emergency Medicine

## 2015-06-01 ENCOUNTER — Emergency Department (HOSPITAL_COMMUNITY)
Admission: EM | Admit: 2015-06-01 | Discharge: 2015-06-01 | Disposition: A | Discharge status: Home / Self Care | Payer: Medicare Other | Source: Home / Self Care | Attending: Emergency Medicine | Admitting: Emergency Medicine

## 2015-06-01 ENCOUNTER — Encounter (HOSPITAL_COMMUNITY): Payer: Self-pay | Admitting: *Deleted

## 2015-06-01 ENCOUNTER — Emergency Department (HOSPITAL_COMMUNITY)
Admission: EM | Admit: 2015-06-01 | Discharge: 2015-06-01 | Disposition: A | Discharge status: Home / Self Care | Payer: Medicare Other | Attending: Emergency Medicine | Admitting: Emergency Medicine

## 2015-06-01 DIAGNOSIS — Z880 Allergy status to penicillin: Secondary | ICD-10-CM | POA: Insufficient documentation

## 2015-06-01 DIAGNOSIS — E119 Type 2 diabetes mellitus without complications: Secondary | ICD-10-CM

## 2015-06-01 DIAGNOSIS — M109 Gout, unspecified: Secondary | ICD-10-CM | POA: Insufficient documentation

## 2015-06-01 DIAGNOSIS — I2510 Atherosclerotic heart disease of native coronary artery without angina pectoris: Secondary | ICD-10-CM

## 2015-06-01 DIAGNOSIS — E785 Hyperlipidemia, unspecified: Secondary | ICD-10-CM | POA: Insufficient documentation

## 2015-06-01 DIAGNOSIS — Z951 Presence of aortocoronary bypass graft: Secondary | ICD-10-CM | POA: Insufficient documentation

## 2015-06-01 DIAGNOSIS — Z7901 Long term (current) use of anticoagulants: Secondary | ICD-10-CM

## 2015-06-01 DIAGNOSIS — Z87898 Personal history of other specified conditions: Secondary | ICD-10-CM

## 2015-06-01 DIAGNOSIS — Z87891 Personal history of nicotine dependence: Secondary | ICD-10-CM | POA: Diagnosis not present

## 2015-06-01 DIAGNOSIS — I10 Essential (primary) hypertension: Secondary | ICD-10-CM | POA: Insufficient documentation

## 2015-06-01 DIAGNOSIS — I252 Old myocardial infarction: Secondary | ICD-10-CM | POA: Insufficient documentation

## 2015-06-01 DIAGNOSIS — I482 Chronic atrial fibrillation: Secondary | ICD-10-CM | POA: Insufficient documentation

## 2015-06-01 DIAGNOSIS — Z8739 Personal history of other diseases of the musculoskeletal system and connective tissue: Secondary | ICD-10-CM | POA: Insufficient documentation

## 2015-06-01 DIAGNOSIS — Z79899 Other long term (current) drug therapy: Secondary | ICD-10-CM | POA: Insufficient documentation

## 2015-06-01 DIAGNOSIS — R040 Epistaxis: Secondary | ICD-10-CM

## 2015-06-01 DIAGNOSIS — Z7951 Long term (current) use of inhaled steroids: Secondary | ICD-10-CM

## 2015-06-01 DIAGNOSIS — F0390 Unspecified dementia without behavioral disturbance: Secondary | ICD-10-CM

## 2015-06-01 DIAGNOSIS — Z9889 Other specified postprocedural states: Secondary | ICD-10-CM | POA: Insufficient documentation

## 2015-06-01 DIAGNOSIS — M2550 Pain in unspecified joint: Secondary | ICD-10-CM | POA: Insufficient documentation

## 2015-06-01 LAB — CBC
HCT: 36.6 % — ABNORMAL LOW (ref 39.0–52.0)
Hemoglobin: 12.2 g/dL — ABNORMAL LOW (ref 13.0–17.0)
MCH: 34.4 pg — ABNORMAL HIGH (ref 26.0–34.0)
MCHC: 33.3 g/dL (ref 30.0–36.0)
MCV: 103.1 fL — ABNORMAL HIGH (ref 78.0–100.0)
PLATELETS: 177 10*3/uL (ref 150–400)
RBC: 3.55 MIL/uL — ABNORMAL LOW (ref 4.22–5.81)
RDW: 14.2 % (ref 11.5–15.5)
WBC: 7.1 10*3/uL (ref 4.0–10.5)

## 2015-06-01 MED ORDER — SILVER NITRATE-POT NITRATE 75-25 % EX MISC
3.0000 | Freq: Once | CUTANEOUS | Status: DC | PRN
  Filled 2015-06-01: qty 3

## 2015-06-01 MED ORDER — OXYMETAZOLINE HCL 0.05 % NA SOLN
1.0000 | Freq: Once | NASAL | Status: AC
  Administered 2015-06-01: 1 via NASAL
  Filled 2015-06-01: qty 15

## 2015-06-01 MED ORDER — COCAINE HCL 4 % EX SOLN
4.0000 mL | Freq: Once | CUTANEOUS | Status: AC
  Administered 2015-06-01: 4 mL via NASAL
  Filled 2015-06-01: qty 4

## 2015-06-01 NOTE — ED Notes (Signed)
Order to stop blood thinner for sat and Sunday 06/01/2015 and 06/02/2015 and to consult pcp for further instructions on Monday, 06/03/2015  faxed to [facility] per nursing staff,

## 2015-06-01 NOTE — ED Notes (Signed)
MD made aware that room is setup.

## 2015-06-01 NOTE — Discharge Instructions (Signed)
It was our pleasure to provide your ER care today - we hope that you feel better.  Avoid rubbing nose, sticking finger in nose, sneezing, or blowing nose for the next couple days.  Leaving packing in place for the next couple days - go to your doctor, or return here if unable to be seen by primary care doctor, for recheck and removal of packing this Monday.  Given nosebleeds today, hold/stop your Xarelto - contact your doctor Monday to discuss whether they want to re-start your xarelto then.     Use saline nasal drops/spray to help keep nasal mucosa moist.  Consider using a humidifier in your bedroom.   If bleeding recurs, hold direct pressure/pinch nose firmly for 10-15 minutes without letting up to check if still bleeding - if bleeding persists, return for recheck.      Nosebleed Nosebleeds are common. They are due to a crack in the inside lining of your nose (mucous membrane) or from a small blood vessel that starts to bleed. Nosebleeds can be caused by many conditions, such as injury, infections, dry mucous membranes or dry climate, medicines, nose picking, and home heating and cooling systems. Most nosebleeds come from blood vessels in the front of your nose. HOME CARE INSTRUCTIONS   Try controlling your nosebleed by pinching your nostrils gently and continuously for at least 10 minutes.  Avoid blowing or sniffing your nose for a number of hours after having a nosebleed.  Do not put gauze inside your nose yourself. If your nose was packed by your health care provider, try to maintain the pack inside of your nose until your health care provider removes it.  If a gauze pack was used and it starts to fall out, gently replace it or cut off the end of it.  If a balloon catheter was used to pack your nose, do not cut or remove it unless your health care provider has instructed you to do that.  Avoid lying down while you are having a nosebleed. Sit up and lean forward.  Use a nasal  spray decongestant to help with a nosebleed as directed by your health care provider.  Do not use petroleum jelly or mineral oil in your nose. These can drip into your lungs.  Maintain humidity in your home by using less air conditioning or by using a humidifier.  Aspirinand blood thinners make bleeding more likely. If you are prescribed these medicines and you suffer from nosebleeds, ask your health care provider if you should stop taking the medicines or adjust the dose. Do not stop medicines unless directed by your health care provider  Resume your normal activities as you are able, but avoid straining, lifting, or bending at the waist for several days.  If your nosebleed was caused by dry mucous membranes, use over-the-counter saline nasal spray or gel. This will keep the mucous membranes moist and allow them to heal. If you must use a lubricant, choose the water-soluble variety. Use it only sparingly, and do not use it within several hours of lying down.  Keep all follow-up visits as directed by your health care provider. This is important. SEEK MEDICAL CARE IF:  You have a fever.  You get frequent nosebleeds.  You are getting nosebleeds more often. SEEK IMMEDIATE MEDICAL CARE IF:  Your nosebleed lasts longer than 20 minutes.  Your nosebleed occurs after an injury to your face, and your nose looks crooked or broken.  You have unusual bleeding from other parts of  your body.  You have unusual bruising on other parts of your body.  You feel light-headed or you faint.  You become sweaty.  You vomit blood.  Your nosebleed occurs after a head injury.   This information is not intended to replace advice given to you by your health care provider. Make sure you discuss any questions you have with your health care provider.   Document Released: 01/07/2005 Document Revised: 04/20/2014 Document Reviewed: 11/13/2013 Elsevier Interactive Patient Education [employer]

## 2015-06-01 NOTE — ED Provider Notes (Addendum)
CSN: [id number]     Arrival date & time 06/01/15  1153 History    Chief Complaint  Patient presents with  . Epistaxis   Patient is a 82 y.o. male presenting with nosebleeds. The history is provided by the patient. No language interpreter was used.  Epistaxis Associated symptoms: no fever and no sore throat    HPI Comments: Aaron [name] is a 82 y.o. male, with a PMhx of STEMI, CAD, and Afib on Xarelto therapy, brought in by ambulance, who presents to the Emergency Department complaining of nosebleeding.  Pt with onset right nares bleeding this AM. Persistent, mild-mod.  No faintness or dizziness. No trauma or fall. Denies other abnormal bruising or bleeding. Compliant w normal meds, no recent change in meds or doses.         Past Medical History  Diagnosis Date  . Aortic stenosis     Probable moderate with discordant gradient (mild) and area (severe)  . Essential hypertension, benign   . Hyperlipidemia   . Gout   . Coronary atherosclerosis of native coronary artery     a. S/P CABG 1998,  b. PCI to CFX with BMS 2008 => ISR => s/p Cypher DES + POBA;  c. inf STEMI 8/14: LHC 11/12/12:  LAD occluded at the origin, mid CFX stent patent, proximal PDA 60-70%, LIMA-LAD patent. Unclear culprit for his inferior STEMI => med Rx (? embolic from AFib)  . Permanent atrial fibrillation (HCC)     Coumadin restarted 11/2012  . STEMI (ST elevation myocardial infarction) (HCC)   . Diabetes mellitus without complication (HCC)   . Dementia    Past Surgical History  Procedure Laterality Date  . Coronary artery bypass graft  09/07/1996    LIMA TO LAD - minimally invasive off-pump procedure  . Cardiac catheterization  11/12/2012    Occluded ostial LAD, patent LIMA-LAD, patent mid LCx stent, 60-70% prox PDA stenosis, mild ascending aortic dilatation w/o dissection or AV insufficiency; unclear culprit lesion- ? cardioembolic vs plaque lysis  . Inguinal hernia repair    . Hernia repair    . Aortogram   11/12/2012    Procedure: AORTOGRAM;  Surgeon: Runell Gess, MD;  Location[facility];  Service: Cardiovascular;;   Family History  Problem Relation Age of Onset  . Stroke Mother   . Stroke Father   . Other Brother 59    MI   Social History  Substance Use Topics  . Smoking status: Former Smoker    Types: Cigarettes  . Smokeless tobacco: Never Used     Comment: smoked from age [age] and then quit  . Alcohol Use: No    Review of Systems  Constitutional: Negative for fever.  HENT: Positive for nosebleeds. Negative for sore throat.   Eyes: Negative for redness.  Respiratory: Negative for shortness of breath.   Cardiovascular: Negative for chest pain.  Gastrointestinal: Negative for abdominal pain and blood in stool.  Genitourinary: Negative for hematuria.  Musculoskeletal: Negative for back pain and neck pain.  Skin: Negative for rash.  Neurological: Negative for syncope and light-headedness.  Hematological: Negative for adenopathy.  Psychiatric/Behavioral: Negative for confusion.   Allergies  Penicillins  Home Medications   Prior to Admission medications   Medication Sig Start Date End Date Taking? Authorizing Provider  acetaminophen (TYLENOL) 325 MG tablet Take 650 mg by mouth every 6 (six) hours.     Historical Provider, MD  ALPRAZolam Prudy Feeler) 0.25 MG tablet Take 0.25 mg by mouth 3 (  three) times daily as needed for anxiety.    Historical Provider, MD  atorvastatin (LIPITOR) 20 MG tablet Take 20 mg by mouth daily.    Historical Provider, MD  divalproex (DEPAKOTE) 125 MG DR tablet Take 125 mg by mouth at bedtime.    Historical Provider, MD  donepezil (ARICEPT) 10 MG tablet Take 10 mg by mouth at bedtime.    Historical Provider, MD  lisinopril (PRINIVIL,ZESTRIL) 10 MG tablet Take 10 mg by mouth daily.    Historical Provider, MD  loratadine (CLARITIN) 10 MG tablet Take 10 mg by mouth daily.    Historical Provider, MD  Melatonin 3 MG TABS Take 1 tablet by mouth at bedtime.     Historical Provider, MD  memantine (NAMENDA) 5 MG tablet Take 5 mg by mouth 2 (two) times daily.    Historical Provider, MD  metoprolol succinate (TOPROL-XL) 25 MG 24 hr tablet Take 1.5 tablets (37.5 mg total) by mouth daily. Take with or immediately following a meal. 11/21/12   Roger A Arguello, PA-C  mineral oil liquid Take by mouth once a week. *Instill one drop into each ear once weekly    Historical Provider, MD  nitroGLYCERIN (NITROSTAT) 0.4 MG SL tablet Place 1 tablet (0.4 mg total) under the tongue every 5 (five) minutes as needed for chest pain. 11/21/12   Roger A Arguello, PA-C  Nutritional Supplements (ENSURE HIGH PROTEIN) LIQD Take 1 Can by mouth 2 (two) times daily.    Historical Provider, MD  Olopatadine HCl (PATADAY) 0.2 % SOLN Place 2 drops into both eyes daily.    Historical Provider, MD  Polyvinyl Alcohol-Povidone (REFRESH OP) Place 1 drop into the left eye 2 (two) times daily as needed (dry or itching eyes.).     Historical Provider, MD  Rivaroxaban (XARELTO) 15 MG TABS tablet Take 15 mg by mouth every evening.     Historical Provider, MD   BP 129/74 mmHg  Pulse 69  Temp(Src) 97.7 F (36.5 C) (Oral)  Resp 16  SpO2 99% Physical Exam  Constitutional: He is oriented to person, place, and time. He appears well-developed and well-nourished. No distress.  HENT:  Bleeding from right nares.   Eyes: Conjunctivae are normal. No scleral icterus.  Neck: Neck supple. No tracheal deviation present.  Cardiovascular: Normal rate and intact distal pulses.   Pulmonary/Chest: Effort normal and breath sounds normal. No accessory muscle usage. No respiratory distress.  Abdominal: He exhibits no distension. There is no tenderness.  Musculoskeletal: Normal range of motion.  Neurological: He is alert and oriented to person, place, and time.  Skin: Skin is warm and dry. No rash noted. He is not diaphoretic.  Psychiatric: He has a normal mood and affect.  Nursing note and vitals  reviewed.   ED Course  Procedures  DIAGNOSTIC STUDIES: Oxygen Saturation is 99% on RA, normal by my interpretation.    COORDINATION OF CARE: 12:03 PM Discussed treatment plan with pt at bedside and pt agreed to plan.   Labs Review  Results for orders placed or performed during the hospital encounter of 06/01/15  CBC  Result Value Ref Range   WBC 7.1 4.0 - 10.5 K/uL   RBC 3.55 (L) 4.22 - 5.81 MIL/uL   Hemoglobin 12.2 (L) 13.0 - 17.0 g/dL   HCT 69.6 (L) 29.5 - 28.4 %   MCV 103.1 (H) 78.0 - 100.0 fL   MCH 34.4 (H) 26.0 - 34.0 pg   MCHC 33.3 30.0 - 36.0 g/dL   RDW 13.2  11.5 - 15.5 %   Platelets 177 150 - 400 K/uL        I have personally reviewed and evaluated these lab results as part of my medical decision-making.    MDM   Pressure held for 10 minutes.  Persistent slow ooze of blood right nares.    Afrin spray, topical cocaine applied. Pressure held.  Small area bleeding (c/w abrasions, ?due to fingernail)  topical cautery w silver nitrate.  On removing temp packing.  Pt recurrent putting finger to nose, despite trying to provide instructions on not touching/picking nose.   Slow ooze blood persists. Pt w fingers to nose. Re-instructions provided.  Given persistent bleeding - will placed 5 cm packing.  Pt tolerating. No recurrent bleeding ant/post.  Will plan to have f/u pcp, or here is unable to be seen, Monday for recheck and packing removal.      Cathren Laine, MD 06/01/15 1424

## 2015-06-01 NOTE — Discharge Instructions (Signed)
The vital signs are well within normal limits. There is no active bleeding noted at this time. There is a saline soaked membrane around the [employer] type device, that may have allowed a mild blood-tinged drainage, but no active bleeding noted at this time. The patient will need to be monitored, as he has Alzheimer's, and may be picking at or pulling at the [facility] type device.

## 2015-06-01 NOTE — ED Notes (Signed)
Patient c/o nose bleed to right nare. Patient was seen here this morning with nose packed twice and cauterized x1 with no success. Patient now has rhino packing in place. Small amount of bleeding noted around rhino packing. Patient does take Xarelto.

## 2015-06-01 NOTE — ED Provider Notes (Signed)
CSN: [id number]     Arrival date & time 06/01/15  1721 History   First MD Initiated Contact with Patient 06/01/15 1941     Chief Complaint  Patient presents with  . Epistaxis     (Consider location/radiation/quality/duration/timing/severity/associated sxs/prior Treatment) HPI Comments: Patient is an 82 year old male who presents to the emergency department with a question of continued nosebleed.  The patient is a resident of a local assisted living facility. He is on Xarelto for atrial fibrillation. The patient developed a nosebleed. He was seen here in the emergency department earlier today. He was treated with a Rhino Rocket type packing device. Upon his return to the assisted living facility, there was some mild drainage from around the packing. The patient was sent back to the emergency department for reevaluation concerning possible continued nosebleed. The patient is awake and alert. He has Alzheimer's, but is at baseline according to the family. He is ambulatory without any problem whatsoever. There is no excessive bruising noted on the body. There is no bleeding from any other areas.  Patient is a 82 y.o. male presenting with nosebleeds. The history is provided by a relative.  Epistaxis   Past Medical History  Diagnosis Date  . Aortic stenosis     Probable moderate with discordant gradient (mild) and area (severe)  . Essential hypertension, benign   . Hyperlipidemia   . Gout   . Coronary atherosclerosis of native coronary artery     a. S/P CABG 1998,  b. PCI to CFX with BMS 2008 => ISR => s/p Cypher DES + POBA;  c. inf STEMI 8/14: LHC 11/12/12:  LAD occluded at the origin, mid CFX stent patent, proximal PDA 60-70%, LIMA-LAD patent. Unclear culprit for his inferior STEMI => med Rx (? embolic from AFib)  . Permanent atrial fibrillation (HCC)     Coumadin restarted 11/2012  . STEMI (ST elevation myocardial infarction) (HCC)   . Diabetes mellitus without complication (HCC)   .  Dementia    Past Surgical History  Procedure Laterality Date  . Coronary artery bypass graft  09/07/1996    LIMA TO LAD - minimally invasive off-pump procedure  . Cardiac catheterization  11/12/2012    Occluded ostial LAD, patent LIMA-LAD, patent mid LCx stent, 60-70% prox PDA stenosis, mild ascending aortic dilatation w/o dissection or AV insufficiency; unclear culprit lesion- ? cardioembolic vs plaque lysis  . Inguinal hernia repair    . Hernia repair    . Aortogram  11/12/2012    Procedure: AORTOGRAM;  Surgeon: Runell Gess, MD;  Location: [facility] CATH LAB;  Service: Cardiovascular;;   Family History  Problem Relation Age of Onset  . Stroke Mother   . Stroke Father   . Other Brother 83    MI   Social History  Substance Use Topics  . Smoking status: Former Smoker    Types: Cigarettes  . Smokeless tobacco: Never Used     Comment: smoked from age [age] and then quit  . Alcohol Use: No    Review of Systems  HENT: Positive for nosebleeds.   Musculoskeletal: Positive for arthralgias.  All other systems reviewed and are negative.     Allergies  Penicillins  Home Medications   Prior to Admission medications   Medication Sig Start Date End Date Taking? Authorizing Provider  acetaminophen (TYLENOL) 325 MG tablet Take 650 mg by mouth every 6 (six) hours.    Yes Historical Provider, MD  allopurinol (ZYLOPRIM) 100 MG tablet Take 100 mg by  mouth daily.   Yes Historical Provider, MD  ALPRAZolam (XANAX) 0.25 MG tablet Take 0.25 mg by mouth 3 (three) times daily as needed for anxiety.   Yes Historical Provider, MD  atorvastatin (LIPITOR) 20 MG tablet Take 20 mg by mouth daily.   Yes Historical Provider, MD  divalproex (DEPAKOTE) 125 MG DR tablet Take 125 mg by mouth at bedtime.   Yes Historical Provider, MD  donepezil (ARICEPT) 10 MG tablet Take 10 mg by mouth at bedtime.   Yes Historical Provider, MD  fluticasone (FLONASE) 50 MCG/ACT nasal spray Place 1 spray into both nostrils 2 (two)  times daily.   Yes Historical Provider, MD  lisinopril (PRINIVIL,ZESTRIL) 10 MG tablet Take 10 mg by mouth daily.   Yes Historical Provider, MD  loratadine (CLARITIN) 10 MG tablet Take 10 mg by mouth daily.   Yes Historical Provider, MD  Melatonin 3 MG TABS Take 1 tablet by mouth at bedtime.   Yes Historical Provider, MD  memantine (NAMENDA) 5 MG tablet Take 5 mg by mouth 2 (two) times daily.   Yes Historical Provider, MD  metoprolol tartrate (LOPRESSOR) 25 MG tablet Take 37.5 mg by mouth daily.   Yes Historical Provider, MD  mineral oil liquid Take by mouth once a week. *Instill one drop into each ear once weekly   Yes Historical Provider, MD  nitroGLYCERIN (NITROSTAT) 0.4 MG SL tablet Place 1 tablet (0.4 mg total) under the tongue every 5 (five) minutes as needed for chest pain. 11/21/12  Yes Roger A Arguello, PA-C  Nutritional Supplements (ENSURE HIGH PROTEIN) LIQD Take 1 Can by mouth 2 (two) times daily.   Yes Historical Provider, MD  Olopatadine HCl (PATADAY) 0.2 % SOLN Place 2 drops into both eyes daily.   Yes Historical Provider, MD  Polyvinyl Alcohol-Povidone (REFRESH OP) Place 1 drop into the left eye 2 (two) times daily as needed (dry or itching eyes.).    Yes Historical Provider, MD  ranitidine (ZANTAC) 150 MG tablet Take 150 mg by mouth at bedtime.   Yes Historical Provider, MD  Rivaroxaban (XARELTO) 15 MG TABS tablet Take 15 mg by mouth every evening.    Yes Historical Provider, MD   BP 157/72 mmHg  Pulse 73  Temp(Src) 97.8 F (36.6 C) (Oral)  Resp 16  Wt 58.968 kg  SpO2 94% Physical Exam  Constitutional: He is oriented to person, place, and time. He appears well-developed and well-nourished.  Non-toxic appearance.  HENT:  Head: Normocephalic.  Right Ear: Tympanic membrane and external ear normal.  Left Ear: Tympanic membrane and external ear normal.  The patient has a Rhino Rocket type device in the right nares. There is minimal liquid present, that appears to be a combination  of residual blood and the saline that was used to lubricate the device. There is no active bleeding at this time. There is no blood in the posterior pharynx. There is no blood in the mouth on the tongue.  Eyes: EOM and lids are normal. Pupils are equal, round, and reactive to light.  Neck: Normal range of motion. Neck supple. Carotid bruit is not present.  Cardiovascular: Normal rate, regular rhythm, normal heart sounds, intact distal pulses and normal pulses.   Pulmonary/Chest: Breath sounds normal. No respiratory distress.  Abdominal: Soft. Bowel sounds are normal. There is no tenderness. There is no guarding.  Musculoskeletal: Normal range of motion.  Lymphadenopathy:       Head (right side): No submandibular adenopathy present.  Head (left side): No submandibular adenopathy present.    He has no cervical adenopathy.  Neurological: He is alert and oriented to person, place, and time. He has normal strength. No cranial nerve deficit or sensory deficit.  Skin: Skin is warm and dry.  Psychiatric: He has a normal mood and affect. His speech is normal.  Nursing note and vitals reviewed.   ED Course  Pt seen with me by Dr Adriana Simas.  Procedures (including critical care time) Labs Review Labs Reviewed - No data to display  Imaging Review No results found. I have personally reviewed and evaluated these images and lab results as part of my medical decision-making.   EKG Interpretation None      MDM  The vital signs are well within normal limits. In particular the blood pressure is 157/72. The patient is ambulatory without problem. The patient is at his usual baseline according to the family. There is no active bleeding appreciated at this time. I suspect that the patient had residual blood in the nares, accompanied by the saline that was used to lubricate the [employer] type device, with some mild drainage present. There is no active bleeding at this time. There is no intervention  needed at this time. I have discussed these findings of the family in detail. Questions have been answered. It is safe for the patient to be discharged back to the assisted living facility.    Final diagnoses:  None    *I have reviewed nursing notes, vital signs, and all appropriate lab and imaging results for this patient.**    Ivery Quale, PA-C 06/01/15 2001  Donnetta Hutching, MD 06/02/15 [phone]

## 2015-06-01 NOTE — ED Notes (Signed)
Pt comes in via EMS from [facility] for nosebleed since 0730 today, pt stopped having nose bleed for around  1 hour then nose bleed started again. Pt does have Alzheimer's and is confused but is at his baseline. Pt is on xarelto.

## 2015-06-03 ENCOUNTER — Encounter (HOSPITAL_COMMUNITY): Payer: Self-pay | Admitting: *Deleted

## 2015-06-03 ENCOUNTER — Emergency Department (HOSPITAL_COMMUNITY)
Admission: EM | Admit: 2015-06-03 | Discharge: 2015-06-03 | Disposition: A | Discharge status: Home / Self Care | Payer: Medicare Other | Attending: Emergency Medicine | Admitting: Emergency Medicine

## 2015-06-03 DIAGNOSIS — I2510 Atherosclerotic heart disease of native coronary artery without angina pectoris: Secondary | ICD-10-CM | POA: Diagnosis not present

## 2015-06-03 DIAGNOSIS — F0390 Unspecified dementia without behavioral disturbance: Secondary | ICD-10-CM | POA: Insufficient documentation

## 2015-06-03 DIAGNOSIS — Z4801 Encounter for change or removal of surgical wound dressing: Secondary | ICD-10-CM | POA: Insufficient documentation

## 2015-06-03 DIAGNOSIS — Z79899 Other long term (current) drug therapy: Secondary | ICD-10-CM | POA: Diagnosis not present

## 2015-06-03 DIAGNOSIS — Z951 Presence of aortocoronary bypass graft: Secondary | ICD-10-CM | POA: Diagnosis not present

## 2015-06-03 DIAGNOSIS — E785 Hyperlipidemia, unspecified: Secondary | ICD-10-CM | POA: Insufficient documentation

## 2015-06-03 DIAGNOSIS — Z9889 Other specified postprocedural states: Secondary | ICD-10-CM | POA: Diagnosis not present

## 2015-06-03 DIAGNOSIS — Z87891 Personal history of nicotine dependence: Secondary | ICD-10-CM | POA: Diagnosis not present

## 2015-06-03 DIAGNOSIS — Z880 Allergy status to penicillin: Secondary | ICD-10-CM | POA: Diagnosis not present

## 2015-06-03 DIAGNOSIS — Z7951 Long term (current) use of inhaled steroids: Secondary | ICD-10-CM | POA: Insufficient documentation

## 2015-06-03 DIAGNOSIS — I10 Essential (primary) hypertension: Secondary | ICD-10-CM | POA: Diagnosis not present

## 2015-06-03 DIAGNOSIS — M109 Gout, unspecified: Secondary | ICD-10-CM | POA: Insufficient documentation

## 2015-06-03 DIAGNOSIS — I482 Chronic atrial fibrillation: Secondary | ICD-10-CM | POA: Insufficient documentation

## 2015-06-03 DIAGNOSIS — E119 Type 2 diabetes mellitus without complications: Secondary | ICD-10-CM | POA: Diagnosis not present

## 2015-06-03 DIAGNOSIS — I252 Old myocardial infarction: Secondary | ICD-10-CM | POA: Insufficient documentation

## 2015-06-03 DIAGNOSIS — Z7901 Long term (current) use of anticoagulants: Secondary | ICD-10-CM | POA: Insufficient documentation

## 2015-06-03 DIAGNOSIS — Z4800 Encounter for change or removal of nonsurgical wound dressing: Secondary | ICD-10-CM

## 2015-06-03 NOTE — Discharge Instructions (Signed)

## 2015-06-03 NOTE — ED Provider Notes (Signed)
CSN: [id number]     Arrival date & time 06/03/15  1045 History   First MD Initiated Contact with Patient 06/03/15 1445     Chief Complaint  Patient presents with  . Follow-up     (Consider location/radiation/quality/duration/timing/severity/associated sxs/prior Treatment) HPI Comments: Patient presents for recheck of epistaxis. Had nasal packing placed on February 18. No further bleeding since then. Request packing be removed today. Denies any shortness of breath or chest pain. He has held xarelto for the past 2 days.no dizziness or lightheadedness. No trauma or recent injury.  The history is provided by the patient. The history is limited by the condition of the patient.    Past Medical History  Diagnosis Date  . Aortic stenosis     Probable moderate with discordant gradient (mild) and area (severe)  . Essential hypertension, benign   . Hyperlipidemia   . Gout   . Coronary atherosclerosis of native coronary artery     a. S/P CABG 1998,  b. PCI to CFX with BMS 2008 => ISR => s/p Cypher DES + POBA;  c. inf STEMI 8/14: LHC 11/12/12:  LAD occluded at the origin, mid CFX stent patent, proximal PDA 60-70%, LIMA-LAD patent. Unclear culprit for his inferior STEMI => med Rx (? embolic from AFib)  . Permanent atrial fibrillation (HCC)     Coumadin restarted 11/2012  . STEMI (ST elevation myocardial infarction) (HCC)   . Diabetes mellitus without complication (HCC)   . Dementia    Past Surgical History  Procedure Laterality Date  . Coronary artery bypass graft  09/07/1996    LIMA TO LAD - minimally invasive off-pump procedure  . Cardiac catheterization  11/12/2012    Occluded ostial LAD, patent LIMA-LAD, patent mid LCx stent, 60-70% prox PDA stenosis, mild ascending aortic dilatation w/o dissection or AV insufficiency; unclear culprit lesion- ? cardioembolic vs plaque lysis  . Inguinal hernia repair    . Hernia repair    . Aortogram  11/12/2012    Procedure: AORTOGRAM;  Surgeon: Runell Gess, MD;  Location: [facility] CATH LAB;  Service: Cardiovascular;;   Family History  Problem Relation Age of Onset  . Stroke Mother   . Stroke Father   . Other Brother 39    MI   Social History  Substance Use Topics  . Smoking status: Former Smoker    Types: Cigarettes  . Smokeless tobacco: Never Used     Comment: smoked from age [age] and then quit  . Alcohol Use: No    Review of Systems  Constitutional: Negative for fever, activity change and appetite change.  Eyes: Negative for visual disturbance.  Respiratory: Negative for chest tightness.   Cardiovascular: Negative for chest pain.  Gastrointestinal: Negative for vomiting and abdominal pain.  Genitourinary: Negative for dysuria, hematuria and testicular pain.  Musculoskeletal: Negative for back pain and neck pain.  Neurological: Negative for dizziness, weakness and headaches.  A complete 10 system review of systems was obtained and all systems are negative except as noted in the HPI and PMH.      Allergies  Penicillins  Home Medications   Prior to Admission medications   Medication Sig Start Date End Date Taking? Authorizing Provider  acetaminophen (TYLENOL) 325 MG tablet Take 650 mg by mouth every 6 (six) hours.    Yes Historical Provider, MD  allopurinol (ZYLOPRIM) 100 MG tablet Take 100 mg by mouth daily.   Yes Historical Provider, MD  ALPRAZolam Prudy Feeler) 0.25 MG tablet Take 0.25 mg by  mouth 3 (three) times daily as needed for anxiety.   Yes Historical Provider, MD  atorvastatin (LIPITOR) 20 MG tablet Take 20 mg by mouth daily.   Yes Historical Provider, MD  divalproex (DEPAKOTE) 125 MG DR tablet Take 125 mg by mouth at bedtime.   Yes Historical Provider, MD  donepezil (ARICEPT) 10 MG tablet Take 10 mg by mouth at bedtime.   Yes Historical Provider, MD  fluticasone (FLONASE) 50 MCG/ACT nasal spray Place 1 spray into both nostrils 2 (two) times daily.   Yes Historical Provider, MD  lisinopril (PRINIVIL,ZESTRIL) 10 MG  tablet Take 10 mg by mouth daily.   Yes Historical Provider, MD  loratadine (CLARITIN) 10 MG tablet Take 10 mg by mouth daily.   Yes Historical Provider, MD  Melatonin 3 MG TABS Take 1 tablet by mouth at bedtime.   Yes Historical Provider, MD  memantine (NAMENDA) 5 MG tablet Take 5 mg by mouth 2 (two) times daily.   Yes Historical Provider, MD  metoprolol tartrate (LOPRESSOR) 25 MG tablet Take 37.5 mg by mouth daily.   Yes Historical Provider, MD  mineral oil liquid Take by mouth once a week. *Instill one drop into each ear once weekly   Yes Historical Provider, MD  nitroGLYCERIN (NITROSTAT) 0.4 MG SL tablet Place 1 tablet (0.4 mg total) under the tongue every 5 (five) minutes as needed for chest pain. 11/21/12  Yes Roger A Arguello, PA-C  Nutritional Supplements (ENSURE HIGH PROTEIN) LIQD Take 1 Can by mouth 2 (two) times daily.   Yes Historical Provider, MD  Olopatadine HCl (PATADAY) 0.2 % SOLN Place 2 drops into both eyes daily.   Yes Historical Provider, MD  Polyvinyl Alcohol-Povidone (REFRESH OP) Place 1 drop into the left eye 2 (two) times daily as needed (dry or itching eyes.).    Yes Historical Provider, MD  ranitidine (ZANTAC) 150 MG tablet Take 150 mg by mouth at bedtime.   Yes Historical Provider, MD  Rivaroxaban (XARELTO) 15 MG TABS tablet Take 15 mg by mouth every evening.     Historical Provider, MD   BP 131/79 mmHg  Pulse 74  Temp(Src) 98.5 F (36.9 C) (Oral)  Resp 16  Ht  (1.6 m)  Wt 120 lb (54.432 kg)  BMI 21.26 kg/m2  SpO2 98% Physical Exam  Constitutional: He is oriented to person, place, and time. He appears well-developed and well-nourished. No distress.  HENT:  Head: Normocephalic and atraumatic.  Mouth/Throat: Oropharynx is clear and moist. No oropharyngeal exudate.  Rhino rocket in R nare.  Dried blood on outside. No blood in oropharynx.  Eyes: Conjunctivae and EOM are normal. Pupils are equal, round, and reactive to light.  Neck: Normal range of motion. Neck  supple.  No meningismus.  Cardiovascular: Normal rate, regular rhythm, normal heart sounds and intact distal pulses.   No murmur heard. Pulmonary/Chest: Effort normal and breath sounds normal. No respiratory distress.  Abdominal: Soft. There is no tenderness. There is no rebound and no guarding.  Musculoskeletal: Normal range of motion. He exhibits no edema or tenderness.  Neurological: He is alert and oriented to person, place, and time. No cranial nerve deficit. He exhibits normal muscle tone. Coordination normal.  No ataxia on finger to nose bilaterally. No pronator drift. 5/5 strength throughout. CN 2-12 intact.Equal grip strength. Sensation intact.   Skin: Skin is warm.  Psychiatric: He has a normal mood and affect. His behavior is normal.  Nursing note and vitals reviewed.   ED Course  Procedures (including critical care time) Labs Review Labs Reviewed - No data to display  Imaging Review No results found. I have personally reviewed and evaluated these images and lab results as part of my medical decision-making.   EKG Interpretation None      MDM   Final diagnoses:  Encounter for removal of nasal packing   Epistaxis with packing in the right naris. Requesting removal today. Vitals are stable.   Packing removed. There is no ongoing bleeding in the nose in the oropharynx. Patient observed for a period of time without recurrence of bleeding.  Follow with PCP and ENT as needed. Return precautions discussed.    Glynn Octave, MD 06/03/15 [phone]

## 2015-06-03 NOTE — ED Notes (Signed)
Pt is here to have nasal packing removed which was placed 2 days ago. NAD.

## 2015-06-03 NOTE — ED Notes (Signed)
No bleeding noted to nasal.

## 2015-06-09 ENCOUNTER — Emergency Department (HOSPITAL_COMMUNITY)
Admission: EM | Admit: 2015-06-09 | Discharge: 2015-06-09 | Disposition: A | Discharge status: Home / Self Care | Payer: Medicare Other | Attending: Emergency Medicine | Admitting: Emergency Medicine

## 2015-06-09 ENCOUNTER — Encounter (HOSPITAL_COMMUNITY): Payer: Self-pay | Admitting: Emergency Medicine

## 2015-06-09 DIAGNOSIS — I252 Old myocardial infarction: Secondary | ICD-10-CM | POA: Diagnosis not present

## 2015-06-09 DIAGNOSIS — Z79899 Other long term (current) drug therapy: Secondary | ICD-10-CM | POA: Insufficient documentation

## 2015-06-09 DIAGNOSIS — I10 Essential (primary) hypertension: Secondary | ICD-10-CM | POA: Diagnosis not present

## 2015-06-09 DIAGNOSIS — E119 Type 2 diabetes mellitus without complications: Secondary | ICD-10-CM | POA: Diagnosis not present

## 2015-06-09 DIAGNOSIS — E785 Hyperlipidemia, unspecified: Secondary | ICD-10-CM | POA: Diagnosis not present

## 2015-06-09 DIAGNOSIS — Z7901 Long term (current) use of anticoagulants: Secondary | ICD-10-CM | POA: Insufficient documentation

## 2015-06-09 DIAGNOSIS — Z951 Presence of aortocoronary bypass graft: Secondary | ICD-10-CM | POA: Insufficient documentation

## 2015-06-09 DIAGNOSIS — I2510 Atherosclerotic heart disease of native coronary artery without angina pectoris: Secondary | ICD-10-CM | POA: Diagnosis not present

## 2015-06-09 DIAGNOSIS — R040 Epistaxis: Secondary | ICD-10-CM | POA: Diagnosis present

## 2015-06-09 DIAGNOSIS — F0390 Unspecified dementia without behavioral disturbance: Secondary | ICD-10-CM | POA: Diagnosis not present

## 2015-06-09 DIAGNOSIS — Z87891 Personal history of nicotine dependence: Secondary | ICD-10-CM | POA: Diagnosis not present

## 2015-06-09 MED ORDER — BACITRACIN-NEOMYCIN-POLYMYXIN 400-5-5000 EX OINT
TOPICAL_OINTMENT | CUTANEOUS | Status: AC
  Filled 2015-06-09: qty 1

## 2015-06-09 MED ORDER — SILVER NITRATE-POT NITRATE 75-25 % EX MISC
CUTANEOUS | Status: AC
  Filled 2015-06-09: qty 1

## 2015-06-09 MED ORDER — SILVER NITRATE-POT NITRATE 75-25 % EX MISC
CUTANEOUS | Status: DC
Start: 2015-06-09 — End: 2015-06-09
  Filled 2015-06-09: qty 2

## 2015-06-09 NOTE — ED Notes (Signed)
Awaiting Dr. Adriana Simas for re-evaluation.  Daughter becoming impatient and inquiring how much longer.  No bleeding from pt's nose.

## 2015-06-09 NOTE — ED Notes (Signed)
Pt's nose began to ooze a small amount of bright red blood.  Bleeding easily controlled.  Daughter concerned about pt being sent back to [facility] as they will send him right back.

## 2015-06-09 NOTE — ED Notes (Signed)
No bleeding noted

## 2015-06-09 NOTE — ED Notes (Signed)
Pt sent from [facility] for evaluation of nose bleed.  Was seen earlier this week for same thing.

## 2015-06-09 NOTE — ED Notes (Signed)
Pt arrived to ED with minimal oozing from right nare.  Pt's face cleaned and large clot dislodged when wiping pt's nose.  No further bleeding after clot removed.

## 2015-06-09 NOTE — ED Provider Notes (Signed)
CSN: [id number]     Arrival date & time 06/09/15  1013 History  By signing my name below, I, Gonzella Lex, attest that this documentation has been prepared under the direction and in the presence of Donnetta Hutching, MD. Electronically Signed: Gonzella Lex, Scribe. 06/09/2015. 11:27 AM.   Chief Complaint  Patient presents with  . Epistaxis   The history is provided by the patient and a relative. No language interpreter was used.    HPI Comments: Aaron [name] is a 83 y.o. male who presents to the Emergency Department complaining of sudden onset of a right sided nose bleed which began earlier today, amount of bleeding unknown. Pt was seen last week for a nosebleed as well and received a rhinopac which was removed six days ago. His daughter also reports that the pt uses Flonase nose spray which she suspects may be contributing to the nosebleeds. He also takes xarelto regularly due to heart conditions and a former MI.   Past Medical History  Diagnosis Date  . Aortic stenosis     Probable moderate with discordant gradient (mild) and area (severe)  . Essential hypertension, benign   . Hyperlipidemia   . Gout   . Coronary atherosclerosis of native coronary artery     a. S/P CABG 1998,  b. PCI to CFX with BMS 2008 => ISR => s/p Cypher DES + POBA;  c. inf STEMI 8/14: LHC 11/12/12:  LAD occluded at the origin, mid CFX stent patent, proximal PDA 60-70%, LIMA-LAD patent. Unclear culprit for his inferior STEMI => med Rx (? embolic from AFib)  . Permanent atrial fibrillation (HCC)     Coumadin restarted 11/2012  . STEMI (ST elevation myocardial infarction) (HCC)   . Diabetes mellitus without complication (HCC)   . Dementia    Past Surgical History  Procedure Laterality Date  . Coronary artery bypass graft  09/07/1996    LIMA TO LAD - minimally invasive off-pump procedure  . Cardiac catheterization  11/12/2012    Occluded ostial LAD, patent LIMA-LAD, patent mid LCx stent, 60-70% prox PDA  stenosis, mild ascending aortic dilatation w/o dissection or AV insufficiency; unclear culprit lesion- ? cardioembolic vs plaque lysis  . Inguinal hernia repair    . Hernia repair    . Aortogram  11/12/2012    Procedure: AORTOGRAM;  Surgeon: Runell Gess, MD;  Location: [facility] CATH LAB;  Service: Cardiovascular;;   Family History  Problem Relation Age of Onset  . Stroke Mother   . Stroke Father   . Other Brother 86    MI   Social History  Substance Use Topics  . Smoking status: Former Smoker    Types: Cigarettes  . Smokeless tobacco: Never Used     Comment: smoked from age [age] and then quit  . Alcohol Use: No    Review of Systems A complete 10 system review of systems was obtained and all systems are negative except as noted in the HPI and PMH.   Allergies  Penicillins  Home Medications   Prior to Admission medications   Medication Sig Start Date End Date Taking? Authorizing Provider  acetaminophen (TYLENOL) 500 MG tablet Take 500 mg by mouth daily.   Yes Historical Provider, MD  allopurinol (ZYLOPRIM) 100 MG tablet Take 100 mg by mouth daily.   Yes Historical Provider, MD  ALPRAZolam Prudy Feeler) 0.5 MG tablet Take 0.25 mg by mouth 3 (three) times daily as needed for anxiety.   Yes Historical Provider, MD  alum & mag hydroxide-simeth (MINTOX) 200-200-20 MG/5ML suspension Take 10 mLs by mouth 3 (three) times daily as needed for indigestion or heartburn.   Yes Historical Provider, MD  atorvastatin (LIPITOR) 20 MG tablet Take 20 mg by mouth daily.   Yes Historical Provider, MD  divalproex (DEPAKOTE) 125 MG DR tablet Take 125 mg by mouth at bedtime.   Yes Historical Provider, MD  docusate sodium (COLACE) 100 MG capsule Take 100 mg by mouth daily as needed for mild constipation.   Yes Historical Provider, MD  donepezil (ARICEPT) 10 MG tablet Take 10 mg by mouth at bedtime.   Yes Historical Provider, MD  fluticasone (FLONASE) 50 MCG/ACT nasal spray Place 1 spray into both nostrils 2  (two) times daily.   Yes Historical Provider, MD  lisinopril (PRINIVIL,ZESTRIL) 10 MG tablet Take 10 mg by mouth daily.   Yes Historical Provider, MD  loratadine (CLARITIN) 10 MG tablet Take 10 mg by mouth daily.   Yes Historical Provider, MD  Melatonin 3 MG TABS Take 1 tablet by mouth at bedtime.   Yes Historical Provider, MD  memantine (NAMENDA) 5 MG tablet Take 5 mg by mouth 2 (two) times daily.   Yes Historical Provider, MD  metoprolol tartrate (LOPRESSOR) 25 MG tablet Take 37.5 mg by mouth daily.   Yes Historical Provider, MD  mineral oil liquid Take by mouth once a week. *Instill one drop into each ear once weekly   Yes Historical Provider, MD  nitroGLYCERIN (NITROSTAT) 0.4 MG SL tablet Place 1 tablet (0.4 mg total) under the tongue every 5 (five) minutes as needed for chest pain. 11/21/12  Yes Roger A Arguello, PA-C  Nutritional Supplements (ENSURE HIGH PROTEIN) LIQD Take 1 Can by mouth 2 (two) times daily.   Yes Historical Provider, MD  Olopatadine HCl (PATADAY) 0.2 % SOLN Place 2 drops into both eyes daily.   Yes Historical Provider, MD  Polyvinyl Alcohol-Povidone (REFRESH OP) Place 1 drop into the left eye 2 (two) times daily as needed (dry or itching eyes.).    Yes Historical Provider, MD  ranitidine (ZANTAC) 150 MG tablet Take 150 mg by mouth at bedtime.   Yes Historical Provider, MD  Rivaroxaban (XARELTO) 15 MG TABS tablet Take 15 mg by mouth every evening.    Yes Historical Provider, MD   BP 154/86 mmHg  Pulse 70  Temp(Src) 97.8 F (36.6 C) (Oral)  Resp 18  Ht  (1.702 m)  Wt 120 lb (54.432 kg)  BMI 18.79 kg/m2  SpO2 100% Physical Exam  Constitutional: He is oriented to person, place, and time. He appears well-developed and well-nourished.  HENT:  Head: Normocephalic and atraumatic.  Clot formed on the medial aspect of his right nares.   Eyes: Conjunctivae and EOM are normal. Pupils are equal, round, and reactive to light.  Neck: Normal range of motion. Neck supple.   Cardiovascular: Normal rate and regular rhythm.   Pulmonary/Chest: Effort normal and breath sounds normal.  Abdominal: Soft. Bowel sounds are normal.  Musculoskeletal: Normal range of motion.  Neurological: He is alert and oriented to person, place, and time.  Skin: Skin is warm and dry.  Psychiatric: He has a normal mood and affect. His behavior is normal.  Nursing note and vitals reviewed.   ED Course  .Epistaxis Management Date/Time: 06/09/2015 1:55 PM Performed by: Donnetta Hutching Authorized by: Donnetta Hutching Consent: Verbal consent obtained. Risks and benefits: risks, benefits and alternatives were discussed Consent given by: patient (Daughter) Patient understanding: patient states  understanding of the procedure being performed Comments: Procedure note:  Right nostril exam: There is a small amount of bleeding in the medial aspect of the nares. Silver nitrate applied 5 applications. Good hemostasis. Neosporin ointment applied.    DIAGNOSTIC STUDIES:    Oxygen Saturation is 100% on RA, normal by my interpretation.   COORDINATION OF CARE:  11:22 AM Advise pt to use vaporizer and antibiotic ointment. Also advise pt to stop taking Xarelto and Flonase. Discussed treatment plan with pt at bedside and pt agreed to plan.   MDM   Final diagnoses:  Right-sided epistaxis   Patient was seen last week for a right sided nosebleed. He returns today with similar symptoms. Wound was cauterized with silver nitrate. Good hemostasis. He will follow-up with Dr. Suszanne Conners [ENT]  I personally performed the services described in this documentation, which was scribed in my presence. The recorded information has been reviewed and is accurate.      Donnetta Hutching, MD 06/09/15 1415

## 2015-06-09 NOTE — Discharge Instructions (Signed)
Hold Xarelto for 2 days.  Recommend Neosporin ointment to nose, saline drops, vaporizer in room. If bleeding returns, apply ice and pressure to nose. Try to see Dr. Suszanne Conners early this week.

## 2015-07-11 ENCOUNTER — Ambulatory Visit (INDEPENDENT_AMBULATORY_CARE_PROVIDER_SITE_OTHER): Payer: Medicare Other | Admitting: Otolaryngology

## 2015-07-11 DIAGNOSIS — R040 Epistaxis: Secondary | ICD-10-CM | POA: Diagnosis not present

## 2015-07-11 DIAGNOSIS — H6123 Impacted cerumen, bilateral: Secondary | ICD-10-CM | POA: Diagnosis not present

## 2015-08-06 ENCOUNTER — Encounter: Payer: Self-pay | Admitting: Cardiology

## 2015-09-30 ENCOUNTER — Ambulatory Visit: Payer: Medicare Other | Admitting: Cardiology

## 2015-10-16 ENCOUNTER — Ambulatory Visit: Payer: Medicare Other | Admitting: Cardiology

## 2015-10-17 ENCOUNTER — Ambulatory Visit (INDEPENDENT_AMBULATORY_CARE_PROVIDER_SITE_OTHER): Payer: Medicare Other | Admitting: Otolaryngology

## 2015-10-17 DIAGNOSIS — H6123 Impacted cerumen, bilateral: Secondary | ICD-10-CM | POA: Diagnosis not present

## 2015-10-18 ENCOUNTER — Ambulatory Visit (INDEPENDENT_AMBULATORY_CARE_PROVIDER_SITE_OTHER): Payer: Medicare Other | Admitting: Adult Health

## 2015-10-18 ENCOUNTER — Encounter: Payer: Self-pay | Admitting: Adult Health

## 2015-10-18 ENCOUNTER — Telehealth: Payer: Self-pay | Admitting: *Deleted

## 2015-10-18 ENCOUNTER — Other Ambulatory Visit (HOSPITAL_COMMUNITY)
Admission: RE | Admit: 2015-10-18 | Discharge: 2015-10-18 | Disposition: A | Discharge status: Home / Self Care | Payer: Medicare Other | Source: Ambulatory Visit | Attending: Adult Health | Admitting: Adult Health

## 2015-10-18 VITALS — BP 142/76 | HR 65 | Ht 68.0 in | Wt 130.0 lb

## 2015-10-18 DIAGNOSIS — Z79899 Other long term (current) drug therapy: Secondary | ICD-10-CM | POA: Insufficient documentation

## 2015-10-18 DIAGNOSIS — I2510 Atherosclerotic heart disease of native coronary artery without angina pectoris: Secondary | ICD-10-CM

## 2015-10-18 DIAGNOSIS — I482 Chronic atrial fibrillation: Secondary | ICD-10-CM | POA: Diagnosis not present

## 2015-10-18 DIAGNOSIS — I4821 Permanent atrial fibrillation: Secondary | ICD-10-CM

## 2015-10-18 DIAGNOSIS — I350 Nonrheumatic aortic (valve) stenosis: Secondary | ICD-10-CM | POA: Diagnosis not present

## 2015-10-18 DIAGNOSIS — Z5181 Encounter for therapeutic drug level monitoring: Secondary | ICD-10-CM | POA: Diagnosis present

## 2015-10-18 LAB — CBC WITH DIFFERENTIAL/PLATELET
BASOS ABS: 0 10*3/uL (ref 0.0–0.1)
BASOS PCT: 1 %
Eosinophils Absolute: 0.3 10*3/uL (ref 0.0–0.7)
Eosinophils Relative: 5 %
HEMATOCRIT: 38.5 % — AB (ref 39.0–52.0)
Hemoglobin: 12.5 g/dL — ABNORMAL LOW (ref 13.0–17.0)
LYMPHS PCT: 17 %
Lymphs Abs: 1.2 10*3/uL (ref 0.7–4.0)
MCH: 33.2 pg (ref 26.0–34.0)
MCHC: 32.5 g/dL (ref 30.0–36.0)
MCV: 102.4 fL — AB (ref 78.0–100.0)
MONO ABS: 0.8 10*3/uL (ref 0.1–1.0)
Monocytes Relative: 11 %
NEUTROS ABS: 4.7 10*3/uL (ref 1.7–7.7)
Neutrophils Relative %: 66 %
PLATELETS: 162 10*3/uL (ref 150–400)
RBC: 3.76 MIL/uL — AB (ref 4.22–5.81)
RDW: 14.3 % (ref 11.5–15.5)
WBC: 7.1 10*3/uL (ref 4.0–10.5)

## 2015-10-18 LAB — BASIC METABOLIC PANEL
ANION GAP: 7 (ref 5–15)
BUN: 23 mg/dL — ABNORMAL HIGH (ref 6–20)
CALCIUM: 8.4 mg/dL — AB (ref 8.9–10.3)
CO2: 30 mmol/L (ref 22–32)
Chloride: 102 mmol/L (ref 101–111)
Creatinine, Ser: 1.29 mg/dL — ABNORMAL HIGH (ref 0.61–1.24)
GFR, EST AFRICAN AMERICAN: 55 mL/min — AB (ref >60)
GFR, EST NON AFRICAN AMERICAN: 47 mL/min — AB (ref >60)
Glucose, Bld: 99 mg/dL (ref 65–99)
POTASSIUM: 4.7 mmol/L (ref 3.5–5.1)
SODIUM: 139 mmol/L (ref 135–145)

## 2015-10-18 MED ORDER — AMLODIPINE BESYLATE 2.5 MG PO TABS: 2.5000 mg | ORAL_TABLET | Freq: Every day | ORAL | Status: DC

## 2015-10-18 MED ORDER — LISINOPRIL 5 MG PO TABS: 5.0000 mg | ORAL_TABLET | Freq: Every day | ORAL | Status: DC

## 2015-10-18 NOTE — Progress Notes (Signed)
Cardiology Office Note   Date:  10/18/2015   ID:  Aaron [name], DOB 10/17/1923, MRN [id number]  PCP:  Colette RibasGOLDING, JOHN CABOT, MD  Cardiologist:  [place]/ Joni ReiningKathryn Keliyah Spillman, NP   No chief complaint on file.     History of Present Illness: Aaron [name] is a 82 y.o. male who presents for ongoing assessment and management of aortic valve stenosis, hypertension, hyperlipidemia, known history of coronary artery disease with CABG in 1998, stent to the mid circumflex, proximal PDA at 67%, permanent atrial fibrillation on Coumadin, with other history to include diabetes and dementia.he was last seen by Dr. Diona BrownerMcDowell in December of 2016 the patient was continued on medical management. Conservative approach adopted concerning aortic valve stenosis. He was asymptomatic.  It with his daughter. He has no complaints of shortness of breath chest pain or fatigue. He has dementia and is a very poor historian. According to his daughter he has not had any bleeding problems falling or weakness. She did state that he is continuing to lose weight since he has [facility] a [facility].  Past Medical History  Diagnosis Date  . Aortic stenosis     Probable moderate with discordant gradient (mild) and area (severe)  . Essential hypertension, benign   . Hyperlipidemia   . Gout   . Coronary atherosclerosis of native coronary artery     a. S/P CABG 1998,  b. PCI to CFX with BMS 2008 => ISR => s/p Cypher DES + POBA;  c. inf STEMI 8/14: LHC 11/12/12:  LAD occluded at the origin, mid CFX stent patent, proximal PDA 60-70%, LIMA-LAD patent. Unclear culprit for his inferior STEMI => med Rx (? embolic from AFib)  . Permanent atrial fibrillation (HCC)     Coumadin restarted 11/2012  . STEMI (ST elevation myocardial infarction) (HCC)   . Diabetes mellitus without complication (HCC)   . Dementia     Past Surgical History  Procedure Laterality Date  . Coronary artery bypass graft  09/07/1996     LIMA TO LAD - minimally invasive off-pump procedure  . Cardiac catheterization  11/12/2012    Occluded ostial LAD, patent LIMA-LAD, patent mid LCx stent, 60-70% prox PDA stenosis, mild ascending aortic dilatation w/o dissection or AV insufficiency; unclear culprit lesion- ? cardioembolic vs plaque lysis  . Inguinal hernia repair    . Hernia repair    . Aortogram  11/12/2012    Procedure: AORTOGRAM;  Surgeon: Runell GessJonathan J Berry, MD;  [facility];  Service: Cardiovascular;;     Current Outpatient Prescriptions  Medication Sig Dispense Refill  . acetaminophen (TYLENOL) 500 MG tablet Take 500 mg by mouth daily.    Marland Kitchen. allopurinol (ZYLOPRIM) 100 MG tablet Take 100 mg by mouth daily.    Marland Kitchen. ALPRAZolam (XANAX) 0.5 MG tablet Take 0.25 mg by mouth 3 (three) times daily as needed for anxiety.    Marland Kitchen. alum & mag hydroxide-simeth (MINTOX) 200-200-20 MG/5ML suspension Take 10 mLs by mouth 3 (three) times daily as needed for indigestion or heartburn.    Marland Kitchen. atorvastatin (LIPITOR) 20 MG tablet Take 20 mg by mouth daily.    . divalproex (DEPAKOTE) 125 MG DR tablet Take 125 mg by mouth at bedtime.    . docusate sodium (COLACE) 100 MG capsule Take 100 mg by mouth daily as needed for mild constipation.    Marland Kitchen. donepezil (ARICEPT) 10 MG tablet Take 10 mg by mouth at bedtime.    . fluticasone (FLONASE) 50 MCG/ACT  nasal spray Place 1 spray into both nostrils 2 (two) times daily.    Marland Kitchen. lisinopril (PRINIVIL,ZESTRIL) 10 MG tablet Take 10 mg by mouth daily.    Marland Kitchen. loratadine (CLARITIN) 10 MG tablet Take 10 mg by mouth daily.    . Melatonin 3 MG TABS Take 1 tablet by mouth at bedtime.    . memantine (NAMENDA) 5 MG tablet Take 5 mg by mouth 2 (two) times daily.    . metoprolol tartrate (LOPRESSOR) 25 MG tablet Take 37.5 mg by mouth daily.    . mineral oil liquid Take by mouth once a week. *Instill one drop into each ear once weekly    . nitroGLYCERIN (NITROSTAT) 0.4 MG SL tablet Place 1 tablet (0.4 mg total) under the tongue  every 5 (five) minutes as needed for chest pain. 25 tablet 3  . Nutritional Supplements (ENSURE HIGH PROTEIN) LIQD Take 1 Can by mouth 2 (two) times daily.    . Olopatadine HCl (PATADAY) 0.2 % SOLN Place 2 drops into both eyes daily.    . Polyvinyl Alcohol-Povidone (REFRESH OP) Place 1 drop into the left eye 2 (two) times daily as needed (dry or itching eyes.).     Marland Kitchen. ranitidine (ZANTAC) 150 MG tablet Take 150 mg by mouth at bedtime.    . Rivaroxaban (XARELTO) 15 MG TABS tablet Take 15 mg by mouth every evening.      No current facility-administered medications for this visit.    Allergies:   Penicillins    Social History:  The patient  reports that he quit smoking about 67 years ago. His smoking use included Cigarettes. He has never used smokeless tobacco. He reports that he does not drink alcohol or use illicit drugs.   Family History:  The patient's family history includes Other (age of onset: 7764) in his brother; Stroke in his father and mother.    ROS: All other systems are reviewed and negative. Unless otherwise mentioned in H&P    PHYSICAL EXAM: VS:  BP 142/76 mmHg  Pulse 65  Ht 5\' 8"  (1.727 m)  Wt 130 lb (58.968 kg)  BMI 19.77 kg/m2  SpO2 93% , BMI Body mass index is 19.77 kg/(m^2). GEN: Well nourished, well developed, in no acute distress HEENT: normal Neck: no JVD, carotid bruits, or masses Cardiac: RRR; 2/6 systolic murmur no murmurs, rubs, or gallops,no edema  Respiratory:  clear to auscultation bilaterally, normal work of breathing GI: soft, nontender, nondistended, + BS MS: no deformity or atrophy Skin: warm and dry, no rash Neuro:  Strength and sensation are intact Psych: euthymic mood, full affect, memory difficulties.   Recent Labs: 10/18/2015: BUN 23*; Creatinine, Ser 1.29*; Hemoglobin 12.5*; Platelets 162; Potassium 4.7; Sodium 139    Lipid Panel    Component Value Date/Time   CHOL 143 03/26/2010 1920   TRIG 122 03/26/2010 1920   HDL 41 03/26/2010 1920    CHOLHDL 3.5 Ratio 03/26/2010 1920   VLDL 24 03/26/2010 1920   LDLCALC 78 03/26/2010 1920      Wt Readings from Last 3 Encounters:  10/18/15 130 lb (58.968 kg)  06/09/15 120 lb (54.432 kg)  06/03/15 120 lb (54.432 kg)      ASSESSMENT AND PLAN:  1. Aortic stenosis:no plans for invasive surgical repair. We'll treat medically with heart rate control and afterload reduction. He'll remain on metoprolol and lisinopril.  2. Coronary artery disease:continued medical therapy with statin beta blocker and ACE inhibitor.  3. Permanent atrial fibrillation:heart rate is well-controlled. He  is tolerating Xarelto toe without issues of bleeding. Will check BMET and CBC   Current medicines are reviewed at length with the patient today.    Labs/ tests ordered today include: BMET, CBC.   Orders Placed This Encounter  Procedures  . Basic Metabolic Panel (BMET)  . CBC with Differential     Disposition:   FU with 6 months.   Signed, Joni Reining, NP  10/18/2015 2:18 PM    [facility] [address], [place], [place] [address] Phone: [phone]; Fax: [phone]

## 2015-10-18 NOTE — Telephone Encounter (Signed)
-----   Message from Jodelle GrossKathryn M Lawrence, NP sent at 10/18/2015  4:18 PM EDT ----- Results reviewed. Mild elevation in creatinine compared to in 6 months ago with decreasing GFR. Decrease lisinopril to 5 mg daily and add, amlodipine 2.5 mg daily. Will need follow up BP check in one week. No evidence of anemia on Xarelto.

## 2015-10-18 NOTE — Patient Instructions (Signed)
Your physician wants you to follow-up in: 6 Months with Dr. Diona BrownerMcDowell. You will receive a reminder letter in the mail two months in advance. If you don't receive a letter, please call our office to schedule the follow-up appointment.  Your physician recommends that you continue on your current medications as directed. Please refer to the Current Medication list given to you today.  Your physician recommends that you have lab work done today.  If you need a refill on your cardiac medications before your next appointment, please call your pharmacy.  Thank you for choosing [facility]!

## 2015-10-22 ENCOUNTER — Telehealth: Payer: Self-pay

## 2015-10-22 NOTE — Telephone Encounter (Signed)
Called pt, no answer-no voicemail. Sent pt letter to inform him of changes and to call office.

## 2015-10-22 NOTE — Telephone Encounter (Signed)
-----   Message from Jodelle GrossKathryn M Lawrence, NP sent at 10/18/2015  4:18 PM EDT ----- Results reviewed. Mild elevation in creatinine compared to in 6 months ago with decreasing GFR. Decrease lisinopril to 5 mg daily and add, amlodipine 2.5 mg daily. Will need follow up BP check in one week. No evidence of anemia on Xarelto.

## 2015-10-25 ENCOUNTER — Telehealth: Payer: Self-pay

## 2015-10-25 NOTE — Telephone Encounter (Signed)
Daughter Aaron LundJANET , HOME [phone], CELL [phone] will call [facility], they can fax BP  READINGS FOR NEXT WEEK

## 2015-10-25 NOTE — Telephone Encounter (Signed)
-----   Message from Jodelle GrossKathryn M Lawrence, NP sent at 10/24/2015  4:45 PM EDT ----- Regarding: RE: BP check  Yes that is fine. Will need to have them call us the results please.  ----- Message -----    From: Kerney ElbeLukisha G Pinnix, LPN    Sent: 1/61/09607/13/2017   4:25 PM      To: Jodelle GrossKathryn M Lawrence, NP Subject: BP check                                       Patient's daughter would like to know if he can have BP check done at assisted living home and called to office. She states that she lives 3 hours away and would be the one to bring him.

## 2015-10-28 ENCOUNTER — Telehealth: Payer: Self-pay | Admitting: Adult Health

## 2015-10-28 NOTE — Telephone Encounter (Signed)
[facility]urse [facility] states that order was sent over to decrease Lisinopril. States that patient has not ben on Lisinopril in over a month. Please advise. /t g

## 2015-10-28 NOTE — Telephone Encounter (Signed)
Last office not had him on lisinopril 5 mg daily. [facility] we [facility]. If he is not taking it, do not restart at lower dose.

## 2015-10-28 NOTE — Telephone Encounter (Signed)
Daughter did not have patients instructions to give to nursing home at last visit, (she accidentally left them behind),requests we [facility].have ent to Harriet PhoK Lawrence NP to sign orders

## 2015-10-28 NOTE — Telephone Encounter (Signed)
Will forward to Ovidio KinL Lawrence NP

## 2015-10-28 NOTE — Telephone Encounter (Signed)
Please call patient's daughter regarding patient's instructions at last visit. / tg

## 2015-10-29 NOTE — Telephone Encounter (Signed)
Spoke with Tammy, she stated pt had not taken lisinopril since April. Let he know not to start lower dose.

## 2015-10-31 ENCOUNTER — Emergency Department (HOSPITAL_COMMUNITY)
Admission: EM | Admit: 2015-10-31 | Discharge: 2015-10-31 | Disposition: A | Discharge status: Home / Self Care | Payer: Medicare Other | Attending: Emergency Medicine | Admitting: Emergency Medicine

## 2015-10-31 ENCOUNTER — Emergency Department (HOSPITAL_COMMUNITY): Payer: Medicare Other

## 2015-10-31 ENCOUNTER — Encounter (HOSPITAL_COMMUNITY): Payer: Self-pay | Admitting: Emergency Medicine

## 2015-10-31 DIAGNOSIS — I10 Essential (primary) hypertension: Secondary | ICD-10-CM | POA: Diagnosis not present

## 2015-10-31 DIAGNOSIS — Z87891 Personal history of nicotine dependence: Secondary | ICD-10-CM | POA: Insufficient documentation

## 2015-10-31 DIAGNOSIS — Z79899 Other long term (current) drug therapy: Secondary | ICD-10-CM | POA: Insufficient documentation

## 2015-10-31 DIAGNOSIS — M898X5 Other specified disorders of bone, thigh: Secondary | ICD-10-CM

## 2015-10-31 DIAGNOSIS — E785 Hyperlipidemia, unspecified: Secondary | ICD-10-CM | POA: Insufficient documentation

## 2015-10-31 DIAGNOSIS — M79651 Pain in right thigh: Secondary | ICD-10-CM | POA: Diagnosis present

## 2015-10-31 DIAGNOSIS — W19XXXA Unspecified fall, initial encounter: Secondary | ICD-10-CM

## 2015-10-31 DIAGNOSIS — I2510 Atherosclerotic heart disease of native coronary artery without angina pectoris: Secondary | ICD-10-CM | POA: Diagnosis not present

## 2015-10-31 DIAGNOSIS — E119 Type 2 diabetes mellitus without complications: Secondary | ICD-10-CM | POA: Insufficient documentation

## 2015-10-31 NOTE — ED Provider Notes (Signed)
CSN: [id number]     Arrival date & time 10/31/15  1153 History  By signing my name below, I, Tanda Rockers, attest that this documentation has been prepared under the direction and in the presence of Donnetta Hutching, MD. Electronically Signed: Tanda Rockers, ED Scribe. 10/31/2015. 12:47 PM.   Chief Complaint  Patient presents with  . Hip Pain  . Fall   The history is provided by the patient and a relative. No language interpreter was used.   LEVEL 5 CAVEAT for dementia  HPI Comments: Aaron [name] is a 82 y.o. male brought in by ambulance, with PMHx essential hypertension, atrial fibrillation, DM, and dementia who presents to the Emergency Department for fall that occurred last night. Per daughter, pt lives at [facility]. Pt told staff that he fell last night but was able to get up himself. When staff found pt, he was standing upright and had a laceration on his right 4th finger. Family reports that pt was complaining of right leg pain earlier but when asked what hurts pt states "I don't know." Family mentions that when patient bears weight onto his right side he yells in pain.   Past Medical History  Diagnosis Date  . Aortic stenosis     Probable moderate with discordant gradient (mild) and area (severe)  . Essential hypertension, benign   . Hyperlipidemia   . Gout   . Coronary atherosclerosis of native coronary artery     a. S/P CABG 1998,  b. PCI to CFX with BMS 2008 => ISR => s/p Cypher DES + POBA;  c. inf STEMI 8/14: LHC 11/12/12:  LAD occluded at the origin, mid CFX stent patent, proximal PDA 60-70%, LIMA-LAD patent. Unclear culprit for his inferior STEMI => med Rx (? embolic from AFib)  . Permanent atrial fibrillation (HCC)     Coumadin restarted 11/2012  . STEMI (ST elevation myocardial infarction) (HCC)   . Diabetes mellitus without complication (HCC)   . Dementia    Past Surgical History  Procedure Laterality Date  . Coronary artery bypass graft  09/07/1996     LIMA TO LAD - minimally invasive off-pump procedure  . Cardiac catheterization  11/12/2012    Occluded ostial LAD, patent LIMA-LAD, patent mid LCx stent, 60-70% prox PDA stenosis, mild ascending aortic dilatation w/o dissection or AV insufficiency; unclear culprit lesion- ? cardioembolic vs plaque lysis  . Inguinal hernia repair    . Hernia repair    . Aortogram  11/12/2012    Procedure: AORTOGRAM;  Surgeon: Runell Gess, MD;  [facility];  Service: Cardiovascular;;   Family History  Problem Relation Age of Onset  . Stroke Mother   . Stroke Father   . Other Brother 43    MI   Social History  Substance Use Topics  . Smoking status: Former Smoker    Types: Cigarettes    Quit date: 04/19/1948  . Smokeless tobacco: Never Used     Comment: smoked from age [age] and then quit  . Alcohol Use: No    Review of Systems  Unable to perform ROS: Dementia   Allergies  Penicillins  Home Medications   Prior to Admission medications   Medication Sig Start Date End Date Taking? Authorizing Provider  nitroGLYCERIN (NITROSTAT) 0.4 MG SL tablet Place 1 tablet (0.4 mg total) under the tongue every 5 (five) minutes as needed for chest pain. 11/21/12  Yes Roger A Arguello, PA-C  acetaminophen (TYLENOL) 500 MG tablet Take 500  mg by mouth daily.    Historical Provider, MD  allopurinol (ZYLOPRIM) 100 MG tablet Take 100 mg by mouth daily.    Historical Provider, MD  ALPRAZolam Prudy Feeler(XANAX) 0.5 MG tablet Take 0.25 mg by mouth 3 (three) times daily as needed for anxiety.    Historical Provider, MD  alum & mag hydroxide-simeth (MINTOX) 200-200-20 MG/5ML suspension Take 10 mLs by mouth 3 (three) times daily as needed for indigestion or heartburn.    Historical Provider, MD  amLODipine (NORVASC) 2.5 MG tablet Take 1 tablet (2.5 mg total) by mouth daily. 10/18/15   Jodelle GrossKathryn M Lawrence, NP  atorvastatin (LIPITOR) 20 MG tablet Take 20 mg by mouth daily.    Historical Provider, MD  divalproex (DEPAKOTE) 125 MG  DR tablet Take 125 mg by mouth at bedtime.    Historical Provider, MD  docusate sodium (COLACE) 100 MG capsule Take 100 mg by mouth daily as needed for mild constipation.    Historical Provider, MD  donepezil (ARICEPT) 10 MG tablet Take 10 mg by mouth at bedtime.    Historical Provider, MD  fluticasone (FLONASE) 50 MCG/ACT nasal spray Place 1 spray into both nostrils 2 (two) times daily.    Historical Provider, MD  loratadine (CLARITIN) 10 MG tablet Take 10 mg by mouth daily.    Historical Provider, MD  Melatonin 3 MG TABS Take 1 tablet by mouth at bedtime.    Historical Provider, MD  memantine (NAMENDA) 5 MG tablet Take 5 mg by mouth 2 (two) times daily.    Historical Provider, MD  metoprolol tartrate (LOPRESSOR) 25 MG tablet Take 37.5 mg by mouth daily.    Historical Provider, MD  mineral oil liquid Take by mouth once a week. *Instill one drop into each ear once weekly    Historical Provider, MD  Nutritional Supplements (ENSURE HIGH PROTEIN) LIQD Take 1 Can by mouth 2 (two) times daily.    Historical Provider, MD  Olopatadine HCl (PATADAY) 0.2 % SOLN Place 2 drops into both eyes daily.    Historical Provider, MD  Polyvinyl Alcohol-Povidone (REFRESH OP) Place 1 drop into the left eye 2 (two) times daily as needed (dry or itching eyes.).     Historical Provider, MD  ranitidine (ZANTAC) 150 MG tablet Take 150 mg by mouth at bedtime.    Historical Provider, MD  Rivaroxaban (XARELTO) 15 MG TABS tablet Take 15 mg by mouth every evening.     Historical Provider, MD   BP 137/66 mmHg  Pulse 58  Temp(Src) 98.8 F (37.1 C) (Temporal)  Resp 18  Ht 5\' 4"  (1.626 m)  Wt 129 lb (58.514 kg)  BMI 22.13 kg/m2  SpO2 93%   Physical Exam  Constitutional: He appears well-developed and well-nourished.  HENT:  Head: Normocephalic and atraumatic.  Eyes: Conjunctivae are normal.  Neck: Neck supple.  Cardiovascular: Normal rate and regular rhythm.   Pulmonary/Chest: Effort normal and breath sounds normal.   Abdominal: Soft. Bowel sounds are normal.  Musculoskeletal: Normal range of motion. He exhibits tenderness.  Tenderness to mid to distal right femur  Neurological: He is alert.  Skin: Skin is warm and dry.  Psychiatric:  Dementia  Nursing note and vitals reviewed.   ED Course  Procedures (including critical care time)  DIAGNOSTIC STUDIES: Oxygen Saturation is 93% on RA, adequate by my interpretation.    COORDINATION OF CARE: 12:45 PM-Discussed treatment plan which includes DG R Femur with family at bedside and family agreed to plan.   Labs Review Labs  Reviewed - No data to display  Imaging Review Dg Hip Unilat With Pelvis 2-3 Views Right  10/31/2015  CLINICAL DATA:  Dementia.  Fall with right hip pain. EXAM: DG HIP (WITH OR WITHOUT PELVIS) 2-3V RIGHT COMPARISON:  None. FINDINGS: There is no evidence of hip fracture or dislocation. There is no evidence of arthropathy or other focal bone abnormality. Advanced arterial calcification. IMPRESSION: Negative. Electronically Signed   By: Paulina Fusi M.D.   On: 10/31/2015 12:39   Dg Femur, Min 2 Views Right  10/31/2015  CLINICAL DATA:  Larey Seat last evening.  Persistent right leg pain. EXAM: RIGHT FEMUR 2 VIEWS COMPARISON:  None. FINDINGS: The hip and knee joints are maintained. Mild degenerative changes. No acute hip or femur fractures identified. The visualized right hemipelvis is grossly intact. Extensive vascular calcifications are noted. IMPRESSION: No acute bony findings. Electronically Signed   By: Rudie Meyer M.D.   On: 10/31/2015 13:29   I have personally reviewed and evaluated these images and lab results as part of my medical decision-making.   EKG Interpretation None      MDM   Final diagnoses:  Fall, initial encounter  Pain in right femur   Plain films of right hip and right femur were negative for fracture. Family reports baseline behavior.   I personally performed the services described in this documentation, which  was scribed in my presence. The recorded information has been reviewed and is accurate.       Donnetta Hutching, MD 10/31/15 [phone]

## 2015-10-31 NOTE — Discharge Instructions (Signed)
X-rays of right hip and right femur were negative. Ice pack. Tylenol for pain.

## 2015-10-31 NOTE — ED Notes (Signed)
Pt's daughter states [facility] and staff reported he fell last night and has not been wanting to walk since falling.  C/o right hip/femur pain.  Pt has dementia and is difficult to assess.

## 2016-01-20 ENCOUNTER — Ambulatory Visit (INDEPENDENT_AMBULATORY_CARE_PROVIDER_SITE_OTHER): Payer: Medicare Other | Admitting: Otolaryngology

## 2016-01-20 DIAGNOSIS — H6123 Impacted cerumen, bilateral: Secondary | ICD-10-CM

## 2016-04-20 ENCOUNTER — Encounter: Payer: Self-pay | Admitting: Cardiology

## 2016-04-20 ENCOUNTER — Ambulatory Visit (INDEPENDENT_AMBULATORY_CARE_PROVIDER_SITE_OTHER): Payer: Medicare Other | Admitting: Cardiology

## 2016-04-20 ENCOUNTER — Other Ambulatory Visit (HOSPITAL_COMMUNITY)
Admission: RE | Admit: 2016-04-20 | Discharge: 2016-04-20 | Disposition: A | Discharge status: Home / Self Care | Payer: Medicare Other | Source: Ambulatory Visit | Attending: Cardiology | Admitting: Cardiology

## 2016-04-20 VITALS — BP 134/70 | HR 61 | Ht 63.0 in | Wt 133.0 lb

## 2016-04-20 DIAGNOSIS — I25810 Atherosclerosis of coronary artery bypass graft(s) without angina pectoris: Secondary | ICD-10-CM | POA: Diagnosis not present

## 2016-04-20 DIAGNOSIS — I482 Chronic atrial fibrillation, unspecified: Secondary | ICD-10-CM

## 2016-04-20 DIAGNOSIS — I10 Essential (primary) hypertension: Secondary | ICD-10-CM | POA: Diagnosis not present

## 2016-04-20 DIAGNOSIS — I350 Nonrheumatic aortic (valve) stenosis: Secondary | ICD-10-CM

## 2016-04-20 LAB — BASIC METABOLIC PANEL
ANION GAP: 7 (ref 5–15)
BUN: 21 mg/dL — AB (ref 6–20)
CO2: 31 mmol/L (ref 22–32)
Calcium: 8.7 mg/dL — ABNORMAL LOW (ref 8.9–10.3)
Chloride: 101 mmol/L (ref 101–111)
Creatinine, Ser: 1.3 mg/dL — ABNORMAL HIGH (ref 0.61–1.24)
GFR, EST AFRICAN AMERICAN: 54 mL/min — AB (ref >60)
GFR, EST NON AFRICAN AMERICAN: 47 mL/min — AB (ref >60)
Glucose, Bld: 92 mg/dL (ref 65–99)
POTASSIUM: 4.5 mmol/L (ref 3.5–5.1)
SODIUM: 139 mmol/L (ref 135–145)

## 2016-04-20 LAB — CBC WITH DIFFERENTIAL/PLATELET
BASOS ABS: 0 10*3/uL (ref 0.0–0.1)
Basophils Relative: 0 %
EOS PCT: 3 %
Eosinophils Absolute: 0.2 10*3/uL (ref 0.0–0.7)
HCT: 41.2 % (ref 39.0–52.0)
HEMOGLOBIN: 13.4 g/dL (ref 13.0–17.0)
Lymphocytes Relative: 21 %
Lymphs Abs: 1.6 10*3/uL (ref 0.7–4.0)
MCH: 34.5 pg — ABNORMAL HIGH (ref 26.0–34.0)
MCHC: 32.5 g/dL (ref 30.0–36.0)
MCV: 106.2 fL — AB (ref 78.0–100.0)
Monocytes Absolute: 0.6 10*3/uL (ref 0.1–1.0)
Monocytes Relative: 8 %
NEUTROS ABS: 5.1 10*3/uL (ref 1.7–7.7)
NEUTROS PCT: 68 %
PLATELETS: 185 10*3/uL (ref 150–400)
RBC: 3.88 MIL/uL — AB (ref 4.22–5.81)
RDW: 14.1 % (ref 11.5–15.5)
WBC: 7.4 10*3/uL (ref 4.0–10.5)

## 2016-04-20 NOTE — Progress Notes (Signed)
Cardiology Office Note  Date: 04/20/2016   ID: Aaron [name], DOB 1923-07-03, MRN [id number]  PCP: Colette Ribas, MD  Primary Cardiologist: [name], MD   Chief Complaint  Patient presents with  . Atrial Fibrillation    History of Present Illness: Aaron [name] is a 83 y.o. male last seen by Ms. Lawrence NP in July 2017. He is here today for a follow-up visit. Continues to reside at [place]. He does not voice any specific complaints. He denied having chest pain, palpitations, or syncope when asked. History somewhat limited by dementia.  I reviewed his medications which are outlined below. He continues on Xarelto. No reported bleeding problems. Lab work from July 2017 is detailed below, he is due for follow-up lab work.  We continue conservative overall management from a cardiac perspective.  Past Medical History:  Diagnosis Date  . Aortic stenosis    Probable moderate with discordant gradient (mild) and area (severe)  . Coronary atherosclerosis of native coronary artery    a. S/P CABG 1998,  b. PCI to CFX with BMS 2008 => ISR => s/p Cypher DES + POBA;  c. inf STEMI 8/14: LHC 11/12/12:  LAD occluded at the origin, mid CFX stent patent, proximal PDA 60-70%, LIMA-LAD patent. Unclear culprit for his inferior STEMI => med Rx (? embolic from AFib)  . Dementia   . Diabetes mellitus without complication (HCC)   . Essential hypertension, benign   . Gout   . Hyperlipidemia   . Permanent atrial fibrillation (HCC)    Coumadin restarted 11/2012  . STEMI (ST elevation myocardial infarction) [facility])     Past Surgical History:  Procedure Laterality Date  . AORTOGRAM  11/12/2012   Procedure: AORTOGRAM;  Surgeon: Runell Gess, MD;  Location: [facility] CATH LAB;  Service: Cardiovascular;;  . CARDIAC CATHETERIZATION  11/12/2012   Occluded ostial LAD, patent LIMA-LAD, patent mid LCx stent, 60-70% prox PDA stenosis, mild ascending aortic dilatation w/o dissection or AV  insufficiency; unclear culprit lesion- ? cardioembolic vs plaque lysis  . CORONARY ARTERY BYPASS GRAFT  09/07/1996   LIMA TO LAD - minimally invasive off-pump procedure  . HERNIA REPAIR    . INGUINAL HERNIA REPAIR      Current Outpatient Prescriptions  Medication Sig Dispense Refill  . acetaminophen (TYLENOL) 500 MG tablet Take 500 mg by mouth daily.    Marland Kitchen allopurinol (ZYLOPRIM) 100 MG tablet Take 100 mg by mouth daily.    Marland Kitchen ALPRAZolam (XANAX) 0.5 MG tablet Take 0.25 mg by mouth 3 (three) times daily as needed for anxiety.    Marland Kitchen alum & mag hydroxide-simeth (MINTOX) 200-200-20 MG/5ML suspension Take 10 mLs by mouth 3 (three) times daily as needed for indigestion or heartburn.    Marland Kitchen amLODipine (NORVASC) 2.5 MG tablet Take 1 tablet (2.5 mg total) by mouth daily. 90 tablet 3  . atorvastatin (LIPITOR) 20 MG tablet Take 20 mg by mouth daily.    . divalproex (DEPAKOTE) 125 MG DR tablet Take 125 mg by mouth at bedtime.    . docusate sodium (COLACE) 100 MG capsule Take 100 mg by mouth daily as needed for mild constipation.    Marland Kitchen donepezil (ARICEPT) 10 MG tablet Take 10 mg by mouth at bedtime.    . fluticasone (FLONASE) 50 MCG/ACT nasal spray Place 1 spray into both nostrils 2 (two) times daily.    Marland Kitchen loratadine (CLARITIN) 10 MG tablet Take 10 mg by mouth daily.    . Melatonin 3 MG  TABS Take 1 tablet by mouth at bedtime.    . memantine (NAMENDA) 5 MG tablet Take 5 mg by mouth 2 (two) times daily.    . metoprolol tartrate (LOPRESSOR) 25 MG tablet Take 37.5 mg by mouth daily.    . mineral oil liquid Take by mouth once a week. *Instill one drop into each ear once weekly    . nitroGLYCERIN (NITROSTAT) 0.4 MG SL tablet Place 1 tablet (0.4 mg total) under the tongue every 5 (five) minutes as needed for chest pain. 25 tablet 3  . Nutritional Supplements (ENSURE HIGH PROTEIN) LIQD Take 1 Can by mouth 2 (two) times daily.    . Olopatadine HCl (PATADAY) 0.2 % SOLN Place 2 drops into both eyes daily.    . Polyvinyl  Alcohol-Povidone (REFRESH OP) Place 1 drop into the left eye 2 (two) times daily as needed (dry or itching eyes.).     Marland Kitchen ranitidine (ZANTAC) 150 MG tablet Take 150 mg by mouth at bedtime.    . Rivaroxaban (XARELTO) 15 MG TABS tablet Take 15 mg by mouth every evening.      No current facility-administered medications for this visit.    Allergies:  Penicillins   Social History: The patient  reports that he quit smoking about 68 years ago. His smoking use included Cigarettes. He has never used smokeless tobacco. He reports that he does not drink alcohol or use drugs.   ROS:  Please see the history of present illness. Otherwise, complete review of systems is positive for hearing loss.  All other systems are reviewed and negative.   Physical Exam: VS:  BP 134/70   Pulse 61   Ht 5\' 3"  (1.6 m)   Wt 133 lb (60.3 kg)   SpO2 98%   BMI 23.56 kg/m , BMI Body mass index is 23.56 kg/m.  Wt Readings from Last 3 Encounters:  04/20/16 133 lb (60.3 kg)  10/31/15 129 lb (58.5 kg)  10/18/15 130 lb (59 kg)    Normally nourished appearing elderly male, comfortable at rest.  HEENT: Conjunctiva and lids normal, oropharynx clear with moist mucosa.  Neck: Supple, no elevated JVP or carotid bruits, no thyromegaly.  Lungs: Clear to auscultation, nonlabored breathing at rest.  Cardiac: Irregularly irregular, no S3 2-3/6 systolic murmur c/w AS, no pericardial rub.  Abdomen: Soft, nontender, bowel sounds present, no guarding or rebound.  Extremities: No pitting edema, mild stasis changes, distal pulses 2+.  Skin: Warm and dry. Musculoskeletal: Mild kyphosis. Neuropsychiatric: Alert and oriented 2, affect upper..  ECG: I personally reviewed the tracing from 06/11/2014 showed rate-controlled atrial fibrillation with right bundle branch block and left anterior fascicular block.  Recent Labwork: 10/18/2015: BUN 23; Creatinine, Ser 1.29; Hemoglobin 12.5; Platelets 162; Potassium 4.7; Sodium 139   Other  Studies Reviewed Today:  Echocardiogram 11/14/2012: Study Conclusions  - Left ventricle: The cavity size was normal. Wall thickness was increased in a pattern of mild LVH. Systolic function was normal. The estimated ejection fraction was in the range of 60% to 65%. Wall motion was normal; there were no regional wall motion abnormalities. Indeterminant diastolic function (atrial fibrillation). - Aortic valve: Trileaflet; moderately calcified leaflets. Trivial regurgitation. Mild AS by mean gradient, severe by calculated valve area. Suspect moderate visually. Mean gradient: 16mm Hg (S). Peak gradient: 30mm Hg (S). Valve area: 0.8cm^2(VTI). - Mitral valve: Mild regurgitation. - Left atrium: The atrium was moderately dilated. - Right ventricle: The cavity size was mildly dilated. Systolic function was normal. - Right  atrium: The atrium was moderately dilated. - Tricuspid valve: Mild-moderate regurgitation. Peak RV-RA gradient: 30mm Hg (S). - Pulmonary arteries: PA peak pressure: 40mm Hg (S). - Systemic veins: IVC measured 1.9 cm with some respirophasic variation, suggesting RA pressure 10 mmHg. Impressions:  - The patient was in atrial fibrillation. Normal LV size with mild LV hypertrophy. EF 60-65%. Mildly dilated RV with normal systolic function. Aortic stenosis was probably moderate (mild by gradient, severe by calculated valve area, visually moderate range). If there is a question of severity, would consider TEE. Mild mitral regurgitation, mild to moderate TR, mild pulmonary hypertension.  Assessment and Plan:  1. Chronic atrial fibrillation. Heart rate adequately controlled today on Lopressor. He continues on Xarelto 15 mg daily. Follow-up BMET and CBC.  2. Moderate to severe calcific aortic stenosis, being managed conservatively. Do not plan further imaging studies at this time.  3. Multivessel CAD status post CABG with subsequent  percutaneous interventions. No angina on medical therapy. Continue with conservative follow-up.  4. Essential hypertension, blood pressure is adequately controlled today.  Current medicines were reviewed with the patient today.   Orders Placed This Encounter  Procedures  . Basic Metabolic Panel (BMET)  . CBC with Differential    Disposition: Follow-up in 6 months.  Signed, Jonelle SidleSamuel G. Meldon Hanzlik, MD, [facility] 04/20/2016 10:16 AM    [facility] [address], [place], [place] [address] Phone: [phone]; Fax: [phone]

## 2016-04-20 NOTE — Patient Instructions (Signed)
Medication Instructions:  Your physician recommends that you continue on your current medications as directed. Please refer to the Current Medication list given to you today.   Labwork: Your physician recommends that you return for lab work in: TODAY  CBC BMET   Testing/Procedures: NONE  Follow-Up: Your physician wants you to follow-up in: 6 MONTHS .  You will receive a reminder letter in the mail two months in advance. If you don't receive a letter, please call our office to schedule the follow-up appointment.   Any Other Special Instructions Will Be Listed Below (If Applicable).     If you need a refill on your cardiac medications before your next appointment, please call your pharmacy.

## 2016-07-06 ENCOUNTER — Ambulatory Visit (INDEPENDENT_AMBULATORY_CARE_PROVIDER_SITE_OTHER): Payer: Medicare Other | Admitting: Otolaryngology

## 2016-07-06 DIAGNOSIS — H6123 Impacted cerumen, bilateral: Secondary | ICD-10-CM

## 2016-08-16 ENCOUNTER — Emergency Department (HOSPITAL_COMMUNITY)
Admission: EM | Admit: 2016-08-16 | Discharge: 2016-08-16 | Disposition: A | Discharge status: Home / Self Care | Payer: Medicare Other | Attending: Emergency Medicine | Admitting: Emergency Medicine

## 2016-08-16 ENCOUNTER — Emergency Department (HOSPITAL_COMMUNITY): Payer: Medicare Other

## 2016-08-16 ENCOUNTER — Encounter (HOSPITAL_COMMUNITY): Payer: Self-pay | Admitting: *Deleted

## 2016-08-16 DIAGNOSIS — Y999 Unspecified external cause status: Secondary | ICD-10-CM | POA: Diagnosis not present

## 2016-08-16 DIAGNOSIS — Y92129 Unspecified place in nursing home as the place of occurrence of the external cause: Secondary | ICD-10-CM | POA: Diagnosis not present

## 2016-08-16 DIAGNOSIS — F0390 Unspecified dementia without behavioral disturbance: Secondary | ICD-10-CM | POA: Diagnosis not present

## 2016-08-16 DIAGNOSIS — W19XXXA Unspecified fall, initial encounter: Secondary | ICD-10-CM | POA: Insufficient documentation

## 2016-08-16 DIAGNOSIS — Z87891 Personal history of nicotine dependence: Secondary | ICD-10-CM | POA: Diagnosis not present

## 2016-08-16 DIAGNOSIS — Y939 Activity, unspecified: Secondary | ICD-10-CM | POA: Diagnosis not present

## 2016-08-16 DIAGNOSIS — I10 Essential (primary) hypertension: Secondary | ICD-10-CM | POA: Diagnosis not present

## 2016-08-16 DIAGNOSIS — Z79899 Other long term (current) drug therapy: Secondary | ICD-10-CM | POA: Insufficient documentation

## 2016-08-16 DIAGNOSIS — E119 Type 2 diabetes mellitus without complications: Secondary | ICD-10-CM | POA: Insufficient documentation

## 2016-08-16 DIAGNOSIS — I2510 Atherosclerotic heart disease of native coronary artery without angina pectoris: Secondary | ICD-10-CM | POA: Diagnosis not present

## 2016-08-16 LAB — CBC WITH DIFFERENTIAL/PLATELET
BASOS ABS: 0 10*3/uL (ref 0.0–0.1)
BASOS PCT: 1 %
Eosinophils Absolute: 0.5 10*3/uL (ref 0.0–0.7)
Eosinophils Relative: 9 %
HCT: 37 % — ABNORMAL LOW (ref 39.0–52.0)
HEMOGLOBIN: 12.4 g/dL — AB (ref 13.0–17.0)
LYMPHS PCT: 23 %
Lymphs Abs: 1.4 10*3/uL (ref 0.7–4.0)
MCH: 34.6 pg — ABNORMAL HIGH (ref 26.0–34.0)
MCHC: 33.5 g/dL (ref 30.0–36.0)
MCV: 103.4 fL — AB (ref 78.0–100.0)
MONO ABS: 0.6 10*3/uL (ref 0.1–1.0)
MONOS PCT: 9 %
NEUTROS ABS: 3.6 10*3/uL (ref 1.7–7.7)
NEUTROS PCT: 58 %
Platelets: 170 10*3/uL (ref 150–400)
RBC: 3.58 MIL/uL — ABNORMAL LOW (ref 4.22–5.81)
RDW: 13.7 % (ref 11.5–15.5)
WBC: 6.1 10*3/uL (ref 4.0–10.5)

## 2016-08-16 LAB — BASIC METABOLIC PANEL
ANION GAP: 4 — AB (ref 5–15)
BUN: 25 mg/dL — ABNORMAL HIGH (ref 6–20)
CALCIUM: 8.5 mg/dL — AB (ref 8.9–10.3)
CHLORIDE: 105 mmol/L (ref 101–111)
CO2: 33 mmol/L — AB (ref 22–32)
Creatinine, Ser: 1.27 mg/dL — ABNORMAL HIGH (ref 0.61–1.24)
GFR calc non Af Amer: 48 mL/min — ABNORMAL LOW (ref >60)
GFR, EST AFRICAN AMERICAN: 56 mL/min — AB (ref >60)
GLUCOSE: 88 mg/dL (ref 65–99)
Potassium: 4.6 mmol/L (ref 3.5–5.1)
Sodium: 142 mmol/L (ref 135–145)

## 2016-08-16 LAB — TROPONIN I: Troponin I: <0.03 ng/mL (ref <0.03)

## 2016-08-16 NOTE — ED Triage Notes (Signed)
Pt a resident [facility], unwitnessed fall, ems arrived and found pt on floor.  Pt has dementia

## 2016-08-16 NOTE — ED Notes (Signed)
ED Provider at bedside. 

## 2016-08-16 NOTE — ED Notes (Signed)
Stand[facility] up in room [facility] and placed where staff can observe and assist-  Awaiting reeval and disposition

## 2016-08-16 NOTE — ED Notes (Signed)
1511-Call to [facility]- POC:   Supervisors would not take call per person answering the phone. "I have told two or three of them and they aren't minding me any attention" Evidently a patient is answering the phone as she cannot identify the names of staff nor state who the director is

## 2016-08-16 NOTE — ED Provider Notes (Signed)
[facility]-EMER[facility]ENCY DEPT Provider Note   CSN: [id number] Arrival date & time: 08/16/16  1231     History   Chief Complaint Chief Complaint  Patient presents with  . Fall    HPI Aaron [name] is a 83 y.o. male.  HPI  The patient is a 83 year old male, he presents to the hospital after having a fall which was unwitnessed at his nursing facility. According to the paramedics they found him on the floor in a common area on a hard floor. He had no complaints, there was no witnesses to this fall. The patient has no complaints. Of note the patient is on Eliquis, he is on Eliquis for atrial fibrillation, he does have dementia and is unable to answer any other questions. He denies any complaints  Past Medical History:  Diagnosis Date  . Aortic stenosis    Probable moderate with discordant gradient (mild) and area (severe)  . Coronary atherosclerosis of native coronary artery    a. S/P CABG 1998,  b. PCI to CFX with BMS 2008 => ISR => s/p Cypher DES + POBA;  c. inf STEMI 8/14: LHC 11/12/12:  LAD occluded at the origin, mid CFX stent patent, proximal PDA 60-70%, LIMA-LAD patent. Unclear culprit for his inferior STEMI => med Rx (? embolic from AFib)  . Dementia   . Diabetes mellitus without complication (HCC)   . Essential hypertension, benign   . Gout   . Hyperlipidemia   . Permanent atrial fibrillation (HCC)    Coumadin restarted 11/2012  . STEMI (ST elevation myocardial infarction) [facility](HCC)     Patient Active Problem List   Diagnosis Date Noted  . Permanent atrial fibrillation (HCC) 11/21/2012  . Memory loss 11/14/2012  . Mixed hyperlipidemia 09/11/2008  . CAD, NATIVE VESSEL 09/11/2008  . Aortic stenosis 09/11/2008    Past Surgical History:  Procedure Laterality Date  . AORTOGRAM  11/12/2012   Procedure: AORTOGRAM;  Surgeon: Runell GessJonathan J Berry, MD;  [facility];  Service: Cardiovascular;;  . CARDIAC CATHETERIZATION  11/12/2012   Occluded ostial LAD, patent LIMA-LAD, patent mid  LCx stent, 60-70% prox PDA stenosis, mild ascending aortic dilatation w/o dissection or AV insufficiency; unclear culprit lesion- ? cardioembolic vs plaque lysis  . CORONARY ARTERY BYPASS GRAFT  09/07/1996   LIMA TO LAD - minimally invasive off-pump procedure  . HERNIA REPAIR    . INGUINAL HERNIA REPAIR         Home Medications    Prior to Admission medications   Medication Sig Start Date End Date Taking? Authorizing Provider  acetaminophen (TYLENOL) 500 MG tablet Take 500 mg by mouth daily.   Yes [provider]  allopurinol (ZYLOPRIM) 100 MG tablet Take 100 mg by mouth daily.   Yes [provider]  ALPRAZolam Prudy Feeler(XANAX) 0.5 MG tablet Take 0.25 mg by mouth 3 (three) times daily as needed for anxiety.   Yes [provider]  alum & mag hydroxide-simeth (MINTOX) 200-200-20 MG/5ML suspension Take 10 mLs by mouth 3 (three) times daily as needed for indigestion or heartburn.   Yes [provider]  apixaban (ELIQUIS) 2.5 MG TABS tablet Take 2.5 mg by mouth 2 (two) times daily. For afib   Yes [provider]  cetirizine (ZYRTEC) 10 MG tablet Take 10 mg by mouth daily.   Yes [provider]  divalproex (DEPAKOTE) 125 MG DR tablet Take 125 mg by mouth at bedtime.   Yes [provider]  donepezil (ARICEPT) 10 MG tablet Take 10  mg by mouth at bedtime.   Yes [provider]  lidocaine (ASPERCREME W/LIDOCAINE) 4 % cream Apply 1 application topically 3 (three) times daily as needed (back pain).   Yes [provider]  Melatonin 3 MG TABS Take 1 tablet by mouth at bedtime.   Yes [provider]  memantine (NAMENDA) 5 MG tablet Take 5 mg by mouth 2 (two) times daily.   Yes [provider]  metoprolol tartrate (LOPRESSOR) 25 MG tablet Take 37.5 mg by mouth daily.   Yes [provider]  mineral oil liquid Take by mouth once a week. *Instill one drop into each ear once weekly   Yes [provider]    nitroGLYCERIN (NITROSTAT) 0.4 MG SL tablet Place 1 tablet (0.4 mg total) under the tongue every 5 (five) minutes as needed for chest pain. 11/21/12  Yes Arguello, Roger A, PA-C  Nutritional Supplements (ENSURE HIGH PROTEIN) LIQD Take 1 Can by mouth 2 (two) times daily.   Yes [provider]  Polyvinyl Alcohol-Povidone (REFRESH OP) Place 1 drop into the left eye 2 (two) times daily as needed (dry or itching eyes.).    Yes [provider]    Family History Family History  Problem Relation Age of Onset  . Stroke Mother   . Stroke Father   . Other Brother 29    MI    Social History Social History  Substance Use Topics  . Smoking status: Former Smoker    Types: Cigarettes    Quit date: 04/19/1948  . Smokeless tobacco: Never Used     Comment: smoked from age [age] and then quit  . Alcohol use No     Allergies   Penicillins   Review of Systems Review of Systems  Unable to perform ROS: Dementia     Physical Exam Updated Vital Signs BP (!) 158/76   Pulse (!) 38   Temp 97.4 F (36.3 C) (Oral)   Resp 19   SpO2 95%   Physical Exam  Constitutional: He appears well-developed and well-nourished. No distress.  HENT:  Head: Normocephalic and atraumatic.  Mouth/Throat: Oropharynx is clear and moist. No oropharyngeal exudate.  No signs of head injury, no hematoma contusion, laceration, abrasion, hemotympanum, raccoon eyes, battle sign or malocclusion, no tenderness to palpation over the scalp  Eyes: Conjunctivae and EOM are normal. Pupils are equal, round, and reactive to light. Right eye exhibits no discharge. Left eye exhibits no discharge. No scleral icterus.  Neck: Normal range of motion. Neck supple. No JVD present. No thyromegaly present.  Supple neck with no tenderness to palpation  Cardiovascular: Intact distal pulses.  Exam reveals no gallop and no friction rub.   Murmur heard. Clear heart and lung sounds, he does have a slight heart murmur which is  systolic, he has a bradycardia with an irregular rhythm, pulses are strong, no JVD, no lower extremity edema  Pulmonary/Chest: Effort normal and breath sounds normal. No respiratory distress. He has no wheezes. He has no rales.  Abdominal: Soft. Bowel sounds are normal. He exhibits no distension and no mass. There is no tenderness.  Musculoskeletal: Normal range of motion. He exhibits no edema or tenderness.  The patient has normal range of motion of all 4 extremities able to lift both legs off the bed to 90 without difficulty, joints are diffusely supple, compartments are diffusely soft. No tenderness over the cervical thoracic or lumbar spines  Lymphadenopathy:    He has no cervical adenopathy.  Neurological: He  is alert. Coordination normal.  Other than having poor memory, the patient is able to follow all of my commands without difficulty. His speech is clear  Skin: Skin is warm and dry. No rash noted. No erythema.  No signs of bruising, injury or skin breakdown  Psychiatric: He has a normal mood and affect. His behavior is normal.  Nursing note and vitals reviewed.    ED Treatments / Results  Labs (all labs ordered are listed, but only abnormal results are displayed) Labs Reviewed  CBC WITH DIFFERENTIAL/PLATELET - Abnormal; Notable for the following:       Result Value   RBC 3.58 (*)    Hemoglobin 12.4 (*)    HCT 37.0 (*)    MCV 103.4 (*)    MCH 34.6 (*)    All other components within normal limits  BASIC METABOLIC PANEL - Abnormal; Notable for the following:    CO2 33 (*)    BUN 25 (*)    Creatinine, Ser 1.27 (*)    Calcium 8.5 (*)    GFR calc non Af Amer 48 (*)    GFR calc Af Amer 56 (*)    Anion gap 4 (*)    All other components within normal limits  TROPONIN I    EKG  EKG Interpretation  Date/Time:  Sunday Aug 16 2016 13:00:31 EDT Ventricular Rate:  53 PR Interval:    QRS Duration: 148 QT Interval:  461 QTC Calculation: 433 R Axis:   -91 Text  Interpretation:  Atrial fibrillation RBBB and LAFB Since last tracing rate slower Confirmed by Mars Scheaffer  MD, Cordelle Dahmen ([id number]) on 08/16/2016 3:10:00 PM       Radiology Dg Chest Port 1 View  Result Date: 08/16/2016 CLINICAL DATA:  Unwitnessed fall at nursing home.  Dementia. EXAM: PORTABLE CHEST 1 VIEW COMPARISON:  11/24/2012 and 11/12/2012 radiographs. FINDINGS: 1302 hours. The heart size and mediastinal contours are stable. There is mild cardiomegaly and aortic atherosclerosis post median sternotomy and probable CABG. The lungs are clear. There is no pleural effusion or pneumothorax. No acute osseous findings are seen. Telemetry leads overlie the chest. IMPRESSION: Stable postoperative chest.  No acute cardiopulmonary process. Electronically Signed   By: [name] Bullocks M.D.   On: 08/16/2016 13:32    Procedures Procedures (including critical care time)  Medications Ordered in ED Medications - No data to display   Initial Impression / Assessment and Plan / ED Course  I have reviewed the triage vital signs and the nursing notes.  Pertinent labs & imaging results that were available during my care of the patient were reviewed by me and considered in my medical decision making (see chart for details).   Due to the bradycardia I will obtain an EKG and some basic labs though I suspect this is just atrial fibrillation with increased rate control. He appears to be perfusing appropriately  The pt;'s ECG is essentially unchanged Labs unremarkable Pt without trauma Will d/c home.  Final Clinical Impressions(s) / ED Diagnoses   Final diagnoses:  Fall, initial encounter  Bradycardia  New Prescriptions New Prescriptions   No medications on file     Eber Hong, MD 08/16/16 1511

## 2016-08-16 NOTE — Discharge Instructions (Signed)
Your testing was unremarkable -  REturn to ER for any worsening symptoms.

## 2016-10-02 ENCOUNTER — Telehealth: Payer: Self-pay | Admitting: Cardiology

## 2016-10-02 ENCOUNTER — Telehealth: Payer: Self-pay | Admitting: Nurse Practitioner

## 2016-10-02 NOTE — Telephone Encounter (Signed)
I will fax this note to dr belcher at [phone], daughter aware

## 2016-10-02 NOTE — Telephone Encounter (Signed)
Please call patient's daughter regarding patient's blood thinner that patient is on and authorization for him to stop blood thinner for tooth extraction. / tg

## 2016-10-02 NOTE — Telephone Encounter (Signed)
Ok to have tooth pulled on eliquis from our standpoint.

## 2016-10-02 NOTE — Telephone Encounter (Signed)
Daughter wants to know if it is possible to remove one upper molar without taking pt of Eliquis. Her dentist says it is possible to control bleeding of one tooth.She wants cardiology approval and then she will speak with her father's dentist, [place]

## 2016-11-09 ENCOUNTER — Ambulatory Visit: Payer: Medicare Other | Admitting: Cardiology

## 2016-11-26 NOTE — Progress Notes (Signed)
Cardiology Office Note  Date: 11/27/2016   ID: Aaron [name], DOB [birth year], MRN [id number]  PCP: System, Provider Not In  Primary Cardiologist: Aaron Dell, MD   Chief Complaint  Patient presents with  . Atrial Fibrillation  . Coronary Artery Disease    History of Present Illness: Aaron [name] is a 83 y.o. male last seen in January. He continues to reside at [facility].He is here today with his daughter for a follow-up visit. He has advanced dementia, does not report any definite recurring angina symptoms. He uses a rolling walker and is otherwise fairly sedentary.  I reviewed his medications. Current cardiac regimen includes Eliquis at 2.5 mg twice daily, Lopressor, and as needed nitroglycerin. He has had no major bleeding episodes. He is in the process of having intermittent dental extractions performed, Eliquis has not been held.  Past Medical History:  Diagnosis Date  . Aortic stenosis    Probable moderate with discordant gradient (mild) and area (severe)  . Coronary atherosclerosis of native coronary artery    a. S/P CABG 1998,  b. PCI to CFX with BMS 2008 => ISR => s/p Cypher DES + POBA;  c. inf STEMI 8/14: LHC 11/12/12:  LAD occluded at the origin, mid CFX stent patent, proximal PDA 60-70%, LIMA-LAD patent. Unclear culprit for his inferior STEMI => med Rx (? embolic from AFib)  . Dementia   . Diabetes mellitus without complication (HCC)   . Essential hypertension, benign   . Gout   . Hyperlipidemia   . Permanent atrial fibrillation (HCC)    Coumadin restarted 11/2012  . STEMI (ST elevation myocardial infarction) [facility])     Past Surgical History:  Procedure Laterality Date  . AORTOGRAM  11/12/2012   Procedure: AORTOGRAM;  Surgeon: Runell Gess, MD;  Location: [facility] CATH LAB;  Service: Cardiovascular;;  . CARDIAC CATHETERIZATION  11/12/2012   Occluded ostial LAD, patent LIMA-LAD, patent mid LCx stent, 60-70% prox PDA stenosis, mild ascending aortic  dilatation w/o dissection or AV insufficiency; unclear culprit lesion- ? cardioembolic vs plaque lysis  . CORONARY ARTERY BYPASS GRAFT  09/07/1996   LIMA TO LAD - minimally invasive off-pump procedure  . HERNIA REPAIR    . INGUINAL HERNIA REPAIR      Current Outpatient Prescriptions  Medication Sig Dispense Refill  . acetaminophen (TYLENOL) 500 MG tablet Take 500 mg by mouth daily.    Marland Kitchen allopurinol (ZYLOPRIM) 100 MG tablet Take 100 mg by mouth daily.    Marland Kitchen ALPRAZolam (XANAX) 0.5 MG tablet Take 0.25 mg by mouth 3 (three) times daily as needed for anxiety.    Marland Kitchen alum & mag hydroxide-simeth (MINTOX) 200-200-20 MG/5ML suspension Take 10 mLs by mouth 3 (three) times daily as needed for indigestion or heartburn.    Marland Kitchen apixaban (ELIQUIS) 2.5 MG TABS tablet Take 2.5 mg by mouth 2 (two) times daily. For afib    . cetirizine (ZYRTEC) 10 MG tablet Take 10 mg by mouth daily.    . divalproex (DEPAKOTE) 125 MG DR tablet Take 125 mg by mouth at bedtime.    . donepezil (ARICEPT) 10 MG tablet Take 10 mg by mouth at bedtime.    . lidocaine (ASPERCREME W/LIDOCAINE) 4 % cream Apply 1 application topically 3 (three) times daily as needed (back pain).    . Melatonin 3 MG TABS Take 1 tablet by mouth at bedtime.    . memantine (NAMENDA) 5 MG tablet Take 5 mg by mouth 2 (two) times daily.    Marland Kitchen  metoprolol tartrate (LOPRESSOR) 25 MG tablet Take 37.5 mg by mouth daily.    . mineral oil liquid Take by mouth once a week. *Instill one drop into each ear once weekly    . nitroGLYCERIN (NITROSTAT) 0.4 MG SL tablet Place 1 tablet (0.4 mg total) under the tongue every 5 (five) minutes as needed for chest pain. 25 tablet 3  . Nutritional Supplements (ENSURE HIGH PROTEIN) LIQD Take 1 Can by mouth 2 (two) times daily.    . Polyvinyl Alcohol-Povidone (REFRESH OP) Place 1 drop into the left eye 2 (two) times daily as needed (dry or itching eyes.).      No current facility-administered medications for this visit.    Allergies:   Penicillins   Social History: The patient  reports that he quit smoking about 68 years ago. His smoking use included Cigarettes. He has never used smokeless tobacco. He reports that he does not drink alcohol or use drugs.   ROS:  Please see the history of present illness. Otherwise, complete review of systems is positive for none.  All other systems are reviewed and negative.   Physical Exam: VS:  BP 140/78   Pulse 68   Ht 5\' 3"  (1.6 m)   Wt 139 lb (63 kg)   SpO2 95%   BMI 24.62 kg/m , BMI Body mass index is 24.62 kg/m.  Wt Readings from Last 3 Encounters:  11/27/16 139 lb (63 kg)  04/20/16 133 lb (60.3 kg)  10/31/15 129 lb (58.5 kg)    Elderly male, appears comfortable at rest. Using a walker. HEENT: Conjunctiva and lids normal, oropharynx clear with moist mucosa.  Neck: Supple, no elevated JVP or carotid bruits, no thyromegaly.  Lungs: Clear to auscultation, nonlabored breathing at rest.  Cardiac: Irregularly irregular, no S3 3/6 systolic murmur c/w AS, no pericardial rub.  Abdomen: Soft, nontender, bowel sounds present, no guarding or rebound.  Extremities: No pitting edema, mild stasis changes, distal pulses 2+.  Skin: Warm and dry. Musculoskeletal: Mild kyphosis. Neuropsychiatric: Alert and oriented 1, affect appropriate..  ECG: I personally reviewed the tracing from 08/16/2016 showed rate-controlled atrial fibrillation with right bundle branch block and left anterior fascicular block.  Recent Labwork: 08/16/2016: BUN 25; Creatinine, Ser 1.27; Hemoglobin 12.4; Platelets 170; Potassium 4.6; Sodium 142     Component Value Date/Time   CHOL 143 03/26/2010 1920   TRIG 122 03/26/2010 1920   HDL 41 03/26/2010 1920   CHOLHDL 3.5 Ratio 03/26/2010 1920   VLDL 24 03/26/2010 1920   LDLCALC 78 03/26/2010 1920    Other Studies Reviewed Today:  Echocardiogram 11/14/2012: Study Conclusions  - Left ventricle: The cavity size was normal. Wall thickness was increased in a  pattern of mild LVH. Systolic function was normal. The estimated ejection fraction was in the range of 60% to 65%. Wall motion was normal; there were no regional wall motion abnormalities. Indeterminant diastolic function (atrial fibrillation). - Aortic valve: Trileaflet; moderately calcified leaflets. Trivial regurgitation. Mild AS by mean gradient, severe by calculated valve area. Suspect moderate visually. Mean gradient: 16mm Hg (S). Peak gradient: 30mm Hg (S). Valve area: 0.8cm^2(VTI). - Mitral valve: Mild regurgitation. - Left atrium: The atrium was moderately dilated. - Right ventricle: The cavity size was mildly dilated. Systolic function was normal. - Right atrium: The atrium was moderately dilated. - Tricuspid valve: Mild-moderate regurgitation. Peak RV-RA gradient: 30mm Hg (S). - Pulmonary arteries: PA peak pressure: 40mm Hg (S). - Systemic veins: IVC measured 1.9 cm with some respirophasic variation,  suggesting RA pressure 10 mmHg. Impressions:  - The patient was in atrial fibrillation. Normal LV size with mild LV hypertrophy. EF 60-65%. Mildly dilated RV with normal systolic function. Aortic stenosis was probably moderate (mild by gradient, severe by calculated valve area, visually moderate range). If there is a question of severity, would consider TEE. Mild mitral regurgitation, mild to moderate TR, mild pulmonary hypertension.  Assessment and Plan:  1. Chronic atrial fibrillation. Continue Lopressor for heart rate control and low-dose Eliquis. I reviewed his lab work from May. Follow-up CBC and BMET will be obtained for next visit.  2. Moderate to severe calcific aortic stenosis, being followed conservatively at this point. No additional imaging studies are being obtained.  3. Essential hypertension, systolic blood pressure in the 140s today. No changes made.  4. Multivessel CAD status post CABG and subsequent percutaneous  coronary interventions. No obvious progression and angina. Continue with conservative management.  Current medicines were reviewed with the patient today.   Orders Placed This Encounter  Procedures  . CBC  . Basic Metabolic Panel (BMET)    Disposition: Follow-up in 6 months.  Signed, Jonelle SidleSamuel G. Zidane Renner, MD, [facility] 11/27/2016 2:21 PM    [facility] [address] [place], [place], [place] [address] Phone: [phone]; Fax: [phone]

## 2016-11-27 ENCOUNTER — Ambulatory Visit (INDEPENDENT_AMBULATORY_CARE_PROVIDER_SITE_OTHER): Payer: Medicare Other | Admitting: Cardiology

## 2016-11-27 ENCOUNTER — Encounter: Payer: Self-pay | Admitting: Cardiology

## 2016-11-27 VITALS — BP 140/78 | HR 68 | Ht 63.0 in | Wt 139.0 lb

## 2016-11-27 DIAGNOSIS — I10 Essential (primary) hypertension: Secondary | ICD-10-CM | POA: Diagnosis not present

## 2016-11-27 DIAGNOSIS — I350 Nonrheumatic aortic (valve) stenosis: Secondary | ICD-10-CM

## 2016-11-27 DIAGNOSIS — I25810 Atherosclerosis of coronary artery bypass graft(s) without angina pectoris: Secondary | ICD-10-CM

## 2016-11-27 DIAGNOSIS — I2510 Atherosclerotic heart disease of native coronary artery without angina pectoris: Secondary | ICD-10-CM

## 2016-11-27 DIAGNOSIS — I482 Chronic atrial fibrillation, unspecified: Secondary | ICD-10-CM

## 2016-11-27 NOTE — Patient Instructions (Signed)
Medication Instructions:  Your physician recommends that you continue on your current medications as directed. Please refer to the Current Medication list given to you today.  Labwork: CBC, BMET- In 6 months orders given today   Testing/Procedures: none  Follow-Up: Your physician wants you to follow-up in: 6 months with Dr. Randa Spike will receive a reminder letter in the mail two months in advance. If you don't receive a letter, please call our office to schedule the follow-up appointment.  Any Other Special Instructions Will Be Listed Below (If Applicable).  If you need a refill on your cardiac medications before your next appointment, please call your pharmacy.

## 2017-01-04 ENCOUNTER — Ambulatory Visit (INDEPENDENT_AMBULATORY_CARE_PROVIDER_SITE_OTHER): Payer: Medicare Other | Admitting: Otolaryngology

## 2017-04-04 ENCOUNTER — Encounter (HOSPITAL_COMMUNITY): Payer: Self-pay | Admitting: Emergency Medicine

## 2017-04-04 ENCOUNTER — Emergency Department (HOSPITAL_COMMUNITY): Payer: Medicare Other

## 2017-04-04 ENCOUNTER — Inpatient Hospital Stay (HOSPITAL_COMMUNITY)
Admission: EM | Admit: 2017-04-04 | Discharge: 2017-04-11 | DRG: 982 | Disposition: A | Discharge status: Skilled Nursing Facility | Payer: Medicare Other | Attending: Family Medicine | Admitting: Family Medicine

## 2017-04-04 ENCOUNTER — Other Ambulatory Visit: Payer: Self-pay

## 2017-04-04 DIAGNOSIS — Z951 Presence of aortocoronary bypass graft: Secondary | ICD-10-CM

## 2017-04-04 DIAGNOSIS — I350 Nonrheumatic aortic (valve) stenosis: Secondary | ICD-10-CM | POA: Diagnosis present

## 2017-04-04 DIAGNOSIS — Z955 Presence of coronary angioplasty implant and graft: Secondary | ICD-10-CM | POA: Diagnosis not present

## 2017-04-04 DIAGNOSIS — R1313 Dysphagia, pharyngeal phase: Secondary | ICD-10-CM | POA: Diagnosis present

## 2017-04-04 DIAGNOSIS — J189 Pneumonia, unspecified organism: Secondary | ICD-10-CM

## 2017-04-04 DIAGNOSIS — I252 Old myocardial infarction: Secondary | ICD-10-CM | POA: Diagnosis not present

## 2017-04-04 DIAGNOSIS — Z87891 Personal history of nicotine dependence: Secondary | ICD-10-CM

## 2017-04-04 DIAGNOSIS — F0391 Unspecified dementia with behavioral disturbance: Secondary | ICD-10-CM | POA: Diagnosis present

## 2017-04-04 DIAGNOSIS — Z880 Allergy status to penicillin: Secondary | ICD-10-CM

## 2017-04-04 DIAGNOSIS — E119 Type 2 diabetes mellitus without complications: Secondary | ICD-10-CM | POA: Diagnosis not present

## 2017-04-04 DIAGNOSIS — J690 Pneumonitis due to inhalation of food and vomit: Secondary | ICD-10-CM | POA: Diagnosis present

## 2017-04-04 DIAGNOSIS — K269 Duodenal ulcer, unspecified as acute or chronic, without hemorrhage or perforation: Secondary | ICD-10-CM | POA: Diagnosis present

## 2017-04-04 DIAGNOSIS — Z66 Do not resuscitate: Secondary | ICD-10-CM | POA: Diagnosis present

## 2017-04-04 DIAGNOSIS — Z7902 Long term (current) use of antithrombotics/antiplatelets: Secondary | ICD-10-CM

## 2017-04-04 DIAGNOSIS — I10 Essential (primary) hypertension: Secondary | ICD-10-CM | POA: Diagnosis present

## 2017-04-04 DIAGNOSIS — K222 Esophageal obstruction: Secondary | ICD-10-CM | POA: Diagnosis present

## 2017-04-04 DIAGNOSIS — Z7901 Long term (current) use of anticoagulants: Secondary | ICD-10-CM

## 2017-04-04 DIAGNOSIS — E782 Mixed hyperlipidemia: Secondary | ICD-10-CM | POA: Diagnosis present

## 2017-04-04 DIAGNOSIS — Z79899 Other long term (current) drug therapy: Secondary | ICD-10-CM | POA: Diagnosis not present

## 2017-04-04 DIAGNOSIS — I482 Chronic atrial fibrillation: Secondary | ICD-10-CM | POA: Diagnosis present

## 2017-04-04 DIAGNOSIS — R1314 Dysphagia, pharyngoesophageal phase: Secondary | ICD-10-CM | POA: Diagnosis not present

## 2017-04-04 DIAGNOSIS — N183 Chronic kidney disease, stage 3 (moderate): Secondary | ICD-10-CM | POA: Diagnosis present

## 2017-04-04 DIAGNOSIS — R413 Other amnesia: Secondary | ICD-10-CM

## 2017-04-04 DIAGNOSIS — I2510 Atherosclerotic heart disease of native coronary artery without angina pectoris: Secondary | ICD-10-CM | POA: Diagnosis present

## 2017-04-04 DIAGNOSIS — Y95 Nosocomial condition: Secondary | ICD-10-CM | POA: Diagnosis present

## 2017-04-04 DIAGNOSIS — K449 Diaphragmatic hernia without obstruction or gangrene: Secondary | ICD-10-CM | POA: Diagnosis present

## 2017-04-04 DIAGNOSIS — I4821 Permanent atrial fibrillation: Secondary | ICD-10-CM | POA: Diagnosis present

## 2017-04-04 DIAGNOSIS — I129 Hypertensive chronic kidney disease with stage 1 through stage 4 chronic kidney disease, or unspecified chronic kidney disease: Secondary | ICD-10-CM | POA: Diagnosis present

## 2017-04-04 DIAGNOSIS — R933 Abnormal findings on diagnostic imaging of other parts of digestive tract: Secondary | ICD-10-CM | POA: Diagnosis not present

## 2017-04-04 DIAGNOSIS — F0390 Unspecified dementia without behavioral disturbance: Secondary | ICD-10-CM | POA: Diagnosis present

## 2017-04-04 DIAGNOSIS — E1122 Type 2 diabetes mellitus with diabetic chronic kidney disease: Secondary | ICD-10-CM | POA: Diagnosis present

## 2017-04-04 LAB — CBC WITH DIFFERENTIAL/PLATELET
BASOS ABS: 0 10*3/uL (ref 0.0–0.1)
Basophils Relative: 0 %
EOS PCT: 3 %
Eosinophils Absolute: 0.4 10*3/uL (ref 0.0–0.7)
HEMATOCRIT: 41.7 % (ref 39.0–52.0)
Hemoglobin: 13.2 g/dL (ref 13.0–17.0)
LYMPHS ABS: 4.1 10*3/uL — AB (ref 0.7–4.0)
LYMPHS PCT: 37 %
MCH: 33.5 pg (ref 26.0–34.0)
MCHC: 31.7 g/dL (ref 30.0–36.0)
MCV: 105.8 fL — AB (ref 78.0–100.0)
Monocytes Absolute: 0.9 10*3/uL (ref 0.1–1.0)
Monocytes Relative: 8 %
NEUTROS ABS: 5.8 10*3/uL (ref 1.7–7.7)
Neutrophils Relative %: 52 %
Platelets: 227 10*3/uL (ref 150–400)
RBC: 3.94 MIL/uL — AB (ref 4.22–5.81)
RDW: 13.6 % (ref 11.5–15.5)
WBC: 11.2 10*3/uL — AB (ref 4.0–10.5)

## 2017-04-04 LAB — TROPONIN I: Troponin I: <0.03 ng/mL (ref <0.03)

## 2017-04-04 LAB — BASIC METABOLIC PANEL
ANION GAP: 13 (ref 5–15)
BUN: 25 mg/dL — AB (ref 6–20)
CHLORIDE: 103 mmol/L (ref 101–111)
CO2: 25 mmol/L (ref 22–32)
Calcium: 8.7 mg/dL — ABNORMAL LOW (ref 8.9–10.3)
Creatinine, Ser: 1.44 mg/dL — ABNORMAL HIGH (ref 0.61–1.24)
GFR calc Af Amer: 47 mL/min — ABNORMAL LOW (ref >60)
GFR, EST NON AFRICAN AMERICAN: 41 mL/min — AB (ref >60)
GLUCOSE: 191 mg/dL — AB (ref 65–99)
POTASSIUM: 3.9 mmol/L (ref 3.5–5.1)
Sodium: 141 mmol/L (ref 135–145)

## 2017-04-04 LAB — MRSA PCR SCREENING: MRSA BY PCR: NEGATIVE

## 2017-04-04 LAB — GLUCOSE, CAPILLARY: GLUCOSE-CAPILLARY: 140 mg/dL — AB (ref 65–99)

## 2017-04-04 LAB — PROCALCITONIN: Procalcitonin: 0.25 ng/mL

## 2017-04-04 MED ORDER — ALPRAZOLAM 0.25 MG PO TABS
0.2500 mg | ORAL_TABLET | Freq: Three times a day (TID) | ORAL | Status: DC | PRN
  Administered 2017-04-06 – 2017-04-10 (×4): 0.25 mg via ORAL
  Filled 2017-04-04 (×6): qty 1

## 2017-04-04 MED ORDER — PIPERACILLIN-TAZOBACTAM 3.375 G IVPB
3.3750 g | Freq: Three times a day (TID) | INTRAVENOUS | Status: DC
  Administered 2017-04-04 – 2017-04-05 (×2): 3.375 g via INTRAVENOUS
  Filled 2017-04-04 (×2): qty 50

## 2017-04-04 MED ORDER — MEMANTINE HCL 10 MG PO TABS
5.0000 mg | ORAL_TABLET | Freq: Two times a day (BID) | ORAL | Status: DC
  Administered 2017-04-04 – 2017-04-11 (×12): 5 mg via ORAL
  Filled 2017-04-04 (×14): qty 1

## 2017-04-04 MED ORDER — ALLOPURINOL 100 MG PO TABS
100.0000 mg | ORAL_TABLET | Freq: Every day | ORAL | Status: DC
  Administered 2017-04-05 – 2017-04-11 (×6): 100 mg via ORAL
  Filled 2017-04-04 (×7): qty 1

## 2017-04-04 MED ORDER — IPRATROPIUM-ALBUTEROL 0.5-2.5 (3) MG/3ML IN SOLN
3.0000 mL | Freq: Once | RESPIRATORY_TRACT | Status: AC
  Administered 2017-04-04: 3 mL via RESPIRATORY_TRACT
  Filled 2017-04-04: qty 3

## 2017-04-04 MED ORDER — VANCOMYCIN HCL IN DEXTROSE 1-5 GM/200ML-% IV SOLN
1000.0000 mg | Freq: Once | INTRAVENOUS | Status: AC
  Administered 2017-04-04: 1000 mg via INTRAVENOUS
  Filled 2017-04-04: qty 200

## 2017-04-04 MED ORDER — LORATADINE 10 MG PO TABS
10.0000 mg | ORAL_TABLET | Freq: Every day | ORAL | Status: DC
  Administered 2017-04-05 – 2017-04-11 (×6): 10 mg via ORAL
  Filled 2017-04-04 (×7): qty 1

## 2017-04-04 MED ORDER — VANCOMYCIN HCL IN DEXTROSE 1-5 GM/200ML-% IV SOLN: 1000.0000 mg | INTRAVENOUS | Status: DC

## 2017-04-04 MED ORDER — PREDNISONE 20 MG PO TABS
20.0000 mg | ORAL_TABLET | Freq: Every day | ORAL | Status: AC
  Administered 2017-04-05 – 2017-04-07 (×3): 20 mg via ORAL
  Filled 2017-04-04 (×3): qty 1

## 2017-04-04 MED ORDER — APIXABAN 2.5 MG PO TABS
2.5000 mg | ORAL_TABLET | Freq: Two times a day (BID) | ORAL | Status: DC
  Administered 2017-04-04 – 2017-04-07 (×5): 2.5 mg via ORAL
  Filled 2017-04-04 (×6): qty 1

## 2017-04-04 MED ORDER — DEXTROSE 5 % IV SOLN
2.0000 g | Freq: Once | INTRAVENOUS | Status: AC
  Administered 2017-04-04: 2 g via INTRAVENOUS
  Filled 2017-04-04: qty 2

## 2017-04-04 MED ORDER — IPRATROPIUM BROMIDE 0.03 % NA SOLN
2.0000 | Freq: Two times a day (BID) | NASAL | Status: DC | PRN
  Filled 2017-04-04: qty 15

## 2017-04-04 MED ORDER — DIVALPROEX SODIUM 125 MG PO DR TAB
125.0000 mg | DELAYED_RELEASE_TABLET | Freq: Every day | ORAL | Status: DC
  Administered 2017-04-04 – 2017-04-10 (×6): 125 mg via ORAL
  Filled 2017-04-04 (×8): qty 1

## 2017-04-04 MED ORDER — ONDANSETRON HCL 4 MG/2ML IJ SOLN
INTRAMUSCULAR | Status: AC
  Administered 2017-04-04: 4 mg via INTRAVENOUS
  Filled 2017-04-04: qty 2

## 2017-04-04 MED ORDER — SODIUM CHLORIDE 0.9 % IV BOLUS (SEPSIS)
500.0000 mL | Freq: Once | INTRAVENOUS | Status: AC
  Administered 2017-04-04: 500 mL via INTRAVENOUS

## 2017-04-04 MED ORDER — DONEPEZIL HCL 5 MG PO TABS
10.0000 mg | ORAL_TABLET | Freq: Every day | ORAL | Status: DC
  Administered 2017-04-04 – 2017-04-10 (×6): 10 mg via ORAL
  Filled 2017-04-04 (×7): qty 2

## 2017-04-04 MED ORDER — DEXTROSE 5 % IV SOLN: 1.0000 g | INTRAVENOUS | Status: DC

## 2017-04-04 MED ORDER — ENSURE ENLIVE PO LIQD
237.0000 mL | Freq: Two times a day (BID) | ORAL | Status: DC
  Administered 2017-04-05 – 2017-04-08 (×8): 237 mL via ORAL

## 2017-04-04 MED ORDER — METOPROLOL TARTRATE 25 MG PO TABS
37.5000 mg | ORAL_TABLET | Freq: Every day | ORAL | Status: DC
  Administered 2017-04-04 – 2017-04-11 (×7): 37.5 mg via ORAL
  Filled 2017-04-04 (×8): qty 2

## 2017-04-04 MED ORDER — ONDANSETRON HCL 4 MG/2ML IJ SOLN
4.0000 mg | Freq: Once | INTRAMUSCULAR | Status: AC
Start: 2017-04-04 — End: 2017-04-04
  Administered 2017-04-04: 4 mg via INTRAVENOUS

## 2017-04-04 MED ORDER — METHYLPREDNISOLONE SODIUM SUCC 125 MG IJ SOLR
125.0000 mg | Freq: Once | INTRAMUSCULAR | Status: AC
  Administered 2017-04-04: 125 mg via INTRAVENOUS
  Filled 2017-04-04: qty 2

## 2017-04-04 MED ORDER — MELATONIN 3 MG PO TABS: 1.0000 | ORAL_TABLET | Freq: Every day | ORAL | Status: DC

## 2017-04-04 MED ORDER — SODIUM CHLORIDE 0.9 % IV SOLN
INTRAVENOUS | Status: DC
  Administered 2017-04-04 – 2017-04-11 (×9): via INTRAVENOUS

## 2017-04-04 NOTE — ED Provider Notes (Signed)
[facility] EMERGENCY DEPARTMENT Provider Note   CSN: [id number] Arrival date & time: 04/04/17  1455     History   Chief Complaint Chief Complaint  Patient presents with  . Emesis    HPI Aaron [name] is a 83 y.o. male.  Level 5 caveat for dementia and urgent need for intervention.  Patient allegedly had a "choking spell" and vomited around 2 PM today.  He then had trouble with his respirations.  EMS did some vigorous suctioning and got approximately 200 mL's of fluid from his oropharynx.  He was placed on a nonrebreather and improved.  On presentation to the emergency department, his color was good and he was talking appropriately.      Past Medical History:  Diagnosis Date  . Aortic stenosis    Probable moderate with discordant gradient (mild) and area (severe)  . Coronary atherosclerosis of native coronary artery    a. S/P CABG 1998,  b. PCI to CFX with BMS 2008 => ISR => s/p Cypher DES + POBA;  c. inf STEMI 8/14: LHC 11/12/12:  LAD occluded at the origin, mid CFX stent patent, proximal PDA 60-70%, LIMA-LAD patent. Unclear culprit for his inferior STEMI => med Rx (? embolic from AFib)  . Dementia   . Diabetes mellitus without complication (HCC)   . Essential hypertension, benign   . Gout   . Hyperlipidemia   . Permanent atrial fibrillation (HCC)    Coumadin restarted 11/2012  . STEMI (ST elevation myocardial infarction) [facility])     Patient Active Problem List   Diagnosis Date Noted  . Permanent atrial fibrillation (HCC) 11/21/2012  . Memory loss 11/14/2012  . Mixed hyperlipidemia 09/11/2008  . CAD, NATIVE VESSEL 09/11/2008  . Aortic stenosis 09/11/2008    Past Surgical History:  Procedure Laterality Date  . AORTOGRAM  11/12/2012   Procedure: AORTOGRAM;  Surgeon: Runell Gess, MD;  Location: [facility] CATH LAB;  Service: Cardiovascular;;  . CARDIAC CATHETERIZATION  11/12/2012   Occluded ostial LAD, patent LIMA-LAD, patent mid LCx stent, 60-70% prox PDA stenosis,  mild ascending aortic dilatation w/o dissection or AV insufficiency; unclear culprit lesion- ? cardioembolic vs plaque lysis  . CORONARY ARTERY BYPASS GRAFT  09/07/1996   LIMA TO LAD - minimally invasive off-pump procedure  . HERNIA REPAIR    . INGUINAL HERNIA REPAIR         Home Medications    Prior to Admission medications   Medication Sig Start Date End Date Taking? Authorizing Provider  allopurinol (ZYLOPRIM) 100 MG tablet Take 100 mg by mouth daily.   Yes [provider]  ALPRAZolam Prudy Feeler) 0.5 MG tablet Take 0.25 mg by mouth 3 (three) times daily as needed for anxiety.   Yes [provider]  alum & mag hydroxide-simeth (MINTOX) 200-200-20 MG/5ML suspension Take 10 mLs by mouth 3 (three) times daily as needed for indigestion or heartburn.   Yes [provider]  apixaban (ELIQUIS) 2.5 MG TABS tablet Take 2.5 mg by mouth 2 (two) times daily. For afib   Yes [provider]  cetirizine (ZYRTEC) 10 MG tablet Take 10 mg by mouth daily.   Yes [provider]  divalproex (DEPAKOTE) 125 MG DR tablet Take 125 mg by mouth at bedtime.   Yes [provider]  donepezil (ARICEPT) 10 MG tablet Take 10 mg by mouth at bedtime.   Yes [provider]  guaifenesin (ROBITUSSIN) 100 MG/5ML syrup Take 200 mg by mouth every 6 (six) hours as needed  for cough.   Yes [provider]  ipratropium (ATROVENT) 0.06 % nasal spray Place 2 sprays into both nostrils 2 (two) times daily as needed for rhinitis.   Yes [provider]  lidocaine (ASPERCREME W/LIDOCAINE) 4 % cream Apply 1 application topically 3 (three) times daily as needed (back pain).   Yes [provider]  Melatonin 3 MG TABS Take 1 tablet by mouth at bedtime.   Yes [provider]  memantine (NAMENDA) 5 MG tablet Take 5 mg by mouth 2 (two) times daily.   Yes [provider]  metoprolol tartrate (LOPRESSOR) 25 MG tablet Take 37.5 mg by mouth daily.    Yes [provider]  mineral oil liquid Take by mouth once a week. *Instill one drop into each ear once weekly   Yes [provider]  nitroGLYCERIN (NITROSTAT) 0.4 MG SL tablet Place 1 tablet (0.4 mg total) under the tongue every 5 (five) minutes as needed for chest pain. 11/21/12  Yes Arguello, Roger A, PA-C  Nutritional Supplements (ENSURE HIGH PROTEIN) LIQD Take 1 Can by mouth 2 (two) times daily.   Yes [provider]  Polyvinyl Alcohol-Povidone (REFRESH OP) Place 1 drop into the left eye 2 (two) times daily as needed (dry or itching eyes.).    Yes [provider]    Family History Family History  Problem Relation Age of Onset  . Stroke Mother   . Stroke Father   . Other Brother 2064       MI    Social History Social History   Tobacco Use  . Smoking status: Former Smoker    Types: Cigarettes    Last attempt to quit: 04/19/1948    Years since quitting: 69.0  . Smokeless tobacco: Never Used  . Tobacco comment: smoked from age [age] and then quit  Substance Use Topics  . Alcohol use: No    Alcohol/week: 0.0 oz  . Drug use: No     Allergies   Penicillins   Review of Systems Review of Systems  Unable to perform ROS: Acuity of condition     Physical Exam Updated Vital Signs BP (!) 153/100   Pulse 73   Resp 17   Ht 5\' 8"  (1.727 m)   Wt 63.5 kg (140 lb)   SpO2 100%   BMI 21.29 kg/m   Physical Exam  Constitutional: He is oriented to person, place, and time.  No profound respiratory distress  HENT:  Head: Normocephalic and atraumatic.  Eyes: Conjunctivae are normal.  Neck: Neck supple.  Cardiovascular: Normal rate and regular rhythm.  Pulmonary/Chest:  Coughing,  Abdominal: Soft. Bowel sounds are normal.  Musculoskeletal: Normal range of motion.  Neurological: He is alert and oriented to person, place, and time.  Skin: Skin is warm and dry.  Psychiatric:  Pleasant, demented  Nursing note and vitals reviewed.    ED  Treatments / Results  Labs (all labs ordered are listed, but only abnormal results are displayed) Labs Reviewed  CBC WITH DIFFERENTIAL/PLATELET - Abnormal; Notable for the following components:      Result Value   WBC 11.2 (*)    RBC 3.94 (*)    MCV 105.8 (*)    Lymphs Abs 4.1 (*)    All other components within normal limits  BASIC METABOLIC PANEL - Abnormal; Notable for the following components:   Glucose, Bld 191 (*)    BUN 25 (*)    Creatinine, Ser 1.44 (*)    Calcium 8.7 (*)  GFR calc non Af Amer 41 (*)    GFR calc Af Amer 47 (*)    All other components within normal limits  TROPONIN I    EKG  EKG Interpretation None       Radiology Dg Chest Port 1 View  Result Date: 04/04/2017 CLINICAL DATA:  Job CT lunch.  Possible aspiration. EXAM: PORTABLE CHEST 1 VIEW COMPARISON:  08/16/2016 FINDINGS: There is opacity at the left lung base silhouetting the left hemidiaphragm. This is consistent with aspiration pneumonitis. Remainder of the lungs is clear. Possible left pleural effusion. No right pleural effusion. No pneumothorax. There stable changes from a previous median sternotomy. Cardiac silhouette is mildly enlarged. No mediastinal or hilar masses. Skeletal structures are grossly intact. IMPRESSION: 1. Left lung base opacity consistent with aspiration pneumonitis given the patient's history. Electronically Signed   By: Amie Portlandavid  Ormond M.D.   On: 04/04/2017 15:24    Procedures Procedures (including critical care time)  Medications Ordered in ED Medications  ondansetron (ZOFRAN) 4 MG/2ML injection (not administered)  vancomycin (VANCOCIN) IVPB 1000 mg/200 mL premix (1,000 mg Intravenous New Bag/Given 04/04/17 1658)  ceFEPIme (MAXIPIME) 1 g in dextrose 5 % 50 mL IVPB (not administered)  vancomycin (VANCOCIN) IVPB 1000 mg/200 mL premix (not administered)  methylPREDNISolone sodium succinate (SOLU-MEDROL) 125 mg/2 mL injection 125 mg (125 mg Intravenous Given 04/04/17 1514)    ipratropium-albuterol (DUONEB) 0.5-2.5 (3) MG/3ML nebulizer solution 3 mL (3 mLs Nebulization Given 04/04/17 1537)  sodium chloride 0.9 % bolus 500 mL (0 mLs Intravenous Stopped 04/04/17 1625)  ceFEPIme (MAXIPIME) 2 g in dextrose 5 % 50 mL IVPB (0 g Intravenous Stopped 04/04/17 1657)     Initial Impression / Assessment and Plan / ED Course  I have reviewed the triage vital signs and the nursing notes.  Pertinent labs & imaging results that were available during my care of the patient were reviewed by me and considered in my medical decision making (see chart for details).     Patient is hemodynamically stable.  Chest x-ray shows a left lung base opacity.  Will initiate HCAP antibiotics.  Patient is a DO NOT RESUSCITATE.  Admit to general medicine.  CRITICAL CARE Performed by: Donnetta HutchingOOK,Jaylise Peek Total critical care time: 30 minutes Critical care time was exclusive of separately billable procedures and treating other patients. Critical care was necessary to treat or prevent imminent or life-threatening deterioration. Critical care was time spent personally by me on the following activities: development of treatment plan with patient and/or surrogate as well as nursing, discussions with consultants, evaluation of patient's response to treatment, examination of patient, obtaining history from patient or surrogate, ordering and performing treatments and interventions, ordering and review of laboratory studies, ordering and review of radiographic studies, pulse oximetry and re-evaluation of patient's condition.  Final Clinical Impressions(s) / ED Diagnoses   Final diagnoses:  Pneumonitis    ED Discharge Orders    None       Donnetta Hutchingook, Sereena Marando, MD 04/04/17 [phone]

## 2017-04-04 NOTE — ED Triage Notes (Signed)
Patient brought in via EMS from [facility]. Alert, oriented to person. Per EMS patient has dementia and is at baseline for orientation. Per [facility] stated patient had a "choking spell" at lunch around 12pm and started vomited around 2pm. Patient had brief period of LOC. Patient conscious upon EMS arrival. Patient lips were cyanotic upon their arrival and patient vomited approx 200ML of emesis, suctioned. Patient placed on nonrebreather.

## 2017-04-04 NOTE — Progress Notes (Addendum)
Pharmacy Antibiotic Note  Aaron [name] is a 83 y.o. male admitted on 04/04/2017 with pneumonia.  Pharmacy has been consulted for vancomycin and cefepime dosing. Change cefepime to zosyn for potential aspiration  Plan: Vancomycin 1gm IV q24 hours Zosyn 3.375 gm IV q8 hours F/u renal function, cultures and clinical course  Height: 5\' 8"  (172.7 cm) Weight: 140 lb (63.5 kg) IBW/kg (Calculated) : 68.4  No data recorded.  Recent Labs  Lab 04/04/17 1512  WBC 11.2*  CREATININE 1.44*    Estimated Creatinine Clearance: 30 mL/min (A) (by C-G formula based on SCr of 1.44 mg/dL (H)).    Allergies  Allergen Reactions  . Penicillins Other (See Comments)    Unknown. Has patient had a PCN reaction causing immediate rash, facial/tongue/throat swelling, SOB or lightheadedness with hypotension: No / unknown  Has patient had a PCN reaction causing severe rash involving mucus membranes or skin necrosis: No/ unknown Has patient had a PCN reaction that required hospitalization No/ unknown Has patient had a PCN reaction occurring within the last 10 years: No If all of the above answers are "NO", then may proceed with Cephalosporin use.      Antimicrobials this admission: vanc 12/23 >>  cefepime 12/23 >>12/23 Zosyn  12/23>>    Thank you for allowing pharmacy to be a part of this patient's care.  Woodfin GanjaSeay, Rashon Rezek Poteet 04/04/2017 5:16 PM

## 2017-04-04 NOTE — H&P (Signed)
History and Physical    Aaron GratesLetcher E Koren WGN:[id number] DOB: 06/20/1923 DOA: 04/04/2017  PCP:  Facility doctor Consultants:  Diona BrownerMcDowell - cardiology; Suszanne Connerseoh - ENT Patient coming from: [facility]; NOK: , [phone]  Chief Complaint: choking episode  HPI: Aaron [name] is a 83 y.o. male with medical history significant of CAD; afib; HLD; HTN; DM; and dementia presenting after an apparent aspiration episode.  He was at the facility and appeared to get choked at lunchtime and vomited.  He seemed to be okay and was walking to his room and vomited again.  At that time they called 911 to send him in.  He vomited more en route.  No known SOB.  He has not had known trouble swallowing in the past, but they did modify his diet in case. He eats very little.  No fever.  +cough, chronic, takes Mucinex and nasal spray for this.     ED Course: Choked, suctioned, placed on NRB.  Currently talkative and pleasant.  Possible L PNA.  Will treat for aspiration PNA/HCAP.  Review of Systems: Unable due to dementia  Ambulatory Status:   Ambulates with a walker when he remembers  Past Medical History:  Diagnosis Date  . Aortic stenosis    Probable moderate with discordant gradient (mild) and area (severe); refused replacement  . Coronary atherosclerosis of native coronary artery    a. S/P CABG 1998,  b. PCI to CFX with BMS 2008 => ISR => s/p Cypher DES + POBA;  c. inf STEMI 8/14: LHC 11/12/12:  LAD occluded at the origin, mid CFX stent patent, proximal PDA 60-70%, LIMA-LAD patent. Unclear culprit for his inferior STEMI => med Rx (? embolic from AFib)  . Dementia   . Diabetes mellitus without complication (HCC)   . Essential hypertension, benign   . Gout   . Hyperlipidemia   . Permanent atrial fibrillation (HCC)    on Eliquis  . STEMI (ST elevation myocardial infarction) [facility](HCC)     Past Surgical History:  Procedure Laterality Date  . AORTOGRAM  11/12/2012   Procedure: AORTOGRAM;  Surgeon:  Runell GessJonathan J Berry, MD;  [facility] LAB;  Service: Cardiovascular;;  . CARDIAC CATHETERIZATION  11/12/2012   Occluded ostial LAD, patent LIMA-LAD, patent mid LCx stent, 60-70% prox PDA stenosis, mild ascending aortic dilatation w/o dissection or AV insufficiency; unclear culprit lesion- ? cardioembolic vs plaque lysis  . CORONARY ARTERY BYPASS GRAFT  09/07/1996   LIMA TO LAD - minimally invasive off-pump procedure  . HERNIA REPAIR    . INGUINAL HERNIA REPAIR      Social History   Socioeconomic History  . Marital status: Divorced    Spouse name: Not on file  . Number of children: 2  . Years of education: Not on file  . Highest education level: Not on file  Social Needs  . Financial resource strain: Not on file  . Food insecurity - worry: Not on file  . Food insecurity - inability: Not on file  . Transportation needs - medical: Not on file  . Transportation needs - non-medical: Not on file  Occupational History    Employer: RETIRED  Tobacco Use  . Smoking status: Former Smoker    Types: Cigarettes    Last attempt to quit: 04/19/1948    Years since quitting: 69.0  . Smokeless tobacco: Never Used  . Tobacco comment: smoked from age [age] and then quit  Substance and Sexual Activity  . Alcohol use: No  Alcohol/week: 0.0 oz  . Drug use: No  . Sexual activity: Not on file  Other Topics Concern  . Not on file  Social History Narrative   Lives in [place]  alone. 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    Allergies  Allergen Reactions  . Penicillins Other (See Comments)    Unknown. Has patient had a PCN reaction causing immediate rash, facial/tongue/throat swelling, SOB or lightheadedness with hypotension: No / unknown  Has patient had a PCN reaction causing severe rash involving mucus membranes or skin necrosis: No/ unknown Has patient had a PCN reaction that required hospitalization No/ unknown Has patient had a PCN reaction occurring within the last 10  years: No If all of the above answers are "NO", then may proceed with Cephalosporin use.      Family History  Problem Relation Age of Onset  . Stroke Mother   . Stroke Father   . Other Brother 69       MI    Prior to Admission medications   Medication Sig Start Date End Date Taking? Authorizing Provider  allopurinol (ZYLOPRIM) 100 MG tablet Take 100 mg by mouth daily.   Yes [provider]  ALPRAZolam Prudy Feeler) 0.5 MG tablet Take 0.25 mg by mouth 3 (three) times daily as needed for anxiety.   Yes [provider]  alum & mag hydroxide-simeth (MINTOX) 200-200-20 MG/5ML suspension Take 10 mLs by mouth 3 (three) times daily as needed for indigestion or heartburn.   Yes [provider]  apixaban (ELIQUIS) 2.5 MG TABS tablet Take 2.5 mg by mouth 2 (two) times daily. For afib   Yes [provider]  cetirizine (ZYRTEC) 10 MG tablet Take 10 mg by mouth daily.   Yes [provider]  divalproex (DEPAKOTE) 125 MG DR tablet Take 125 mg by mouth at bedtime.   Yes [provider]  donepezil (ARICEPT) 10 MG tablet Take 10 mg by mouth at bedtime.   Yes [provider]  guaifenesin (ROBITUSSIN) 100 MG/5ML syrup Take 200 mg by mouth every 6 (six) hours as needed for cough.   Yes [provider]  ipratropium (ATROVENT) 0.06 % nasal spray Place 2 sprays into both nostrils 2 (two) times daily as needed for rhinitis.   Yes [provider]  lidocaine (ASPERCREME W/LIDOCAINE) 4 % cream Apply 1 application topically 3 (three) times daily as needed (back pain).   Yes [provider]  Melatonin 3 MG TABS Take 1 tablet by mouth at bedtime.   Yes [provider]  memantine (NAMENDA) 5 MG tablet Take 5 mg by mouth 2 (two) times daily.   Yes [provider]  metoprolol tartrate (LOPRESSOR) 25 MG tablet Take 37.5 mg by mouth daily.   Yes [provider]  mineral oil liquid Take by mouth once a week.  *Instill one drop into each ear once weekly   Yes [provider]  nitroGLYCERIN (NITROSTAT) 0.4 MG SL tablet Place 1 tablet (0.4 mg total) under the tongue every 5 (five) minutes as needed for chest pain. 11/21/12  Yes Arguello, Roger A, PA-C  Nutritional Supplements (ENSURE HIGH PROTEIN) LIQD Take 1 Can by mouth 2 (two) times daily.   Yes [provider]  Polyvinyl Alcohol-Povidone (REFRESH OP) Place 1 drop into the left eye 2 (two) times daily as needed (dry or itching eyes.).    Yes [provider]    Physical Exam: Vitals:   04/04/17 1730 04/04/17 1800  04/04/17 1841 04/04/17 1908  BP: (!) 172/86 (!) 172/100 (!) 155/105 (!) 168/103  Pulse: 78 82 78 80  Resp: 20 (!) 22 16 20   Temp:    97.7 F (36.5 C)  TempSrc:    Oral  SpO2: 100% 100% 100% 95%  Weight:      Height:         General:  Appears calm and comfortable and is NAD; no cough during our encounter Eyes:  PERRL, EOMI, normal lids, iris ENT:  Hard of hearing, normal lips & tongue, mmm Neck:  no LAD, masses or thyromegaly; no carotid bruits Cardiovascular:  Irregularly irregular, 3-4/6 systolic murmur, no r/g. No LE edema.  Respiratory:   Bibasilar rhonchi.  Normal respiratory effort. Abdomen:  soft, NT, ND, NABS Skin:  no rash or induration seen on limited exam Musculoskeletal:  grossly normal tone BUE/BLE, good ROM, no bony abnormality Psychiatric:  grossly normal mood and affect, speech fluent and appropriate, AOx1 (knows name, aware that he is in a hospital but can go no further with location, unaware of time) Neurologic:  CN 2-12 grossly intact, moves all extremities in coordinated fashion, sensation intact    Radiological Exams on Admission: Dg Chest Port 1 View  Result Date: 04/04/2017 CLINICAL DATA:  Job CT lunch.  Possible aspiration. EXAM: PORTABLE CHEST 1 VIEW COMPARISON:  08/16/2016 FINDINGS: There is opacity at the left lung base silhouetting the left hemidiaphragm. This is  consistent with aspiration pneumonitis. Remainder of the lungs is clear. Possible left pleural effusion. No right pleural effusion. No pneumothorax. There stable changes from a previous median sternotomy. Cardiac silhouette is mildly enlarged. No mediastinal or hilar masses. Skeletal structures are grossly intact. IMPRESSION: 1. Left lung base opacity consistent with aspiration pneumonitis given the patient's history. Electronically Signed   By: Amie Portlandavid  Ormond M.D.   On: 04/04/2017 15:24    EKG: Independently reviewed.  Afib with rate 79; RBBB; no evidence of acute ischemia   Labs on Admission: I have personally reviewed the available labs and imaging studies at the time of the admission.  Pertinent labs:   Glucose 191 BUN 25/Creatinine 1.44/GFR 47; prior 25/1.27/56 in 5/18 Troponin <0.03 WBC 11.2  Assessment/Plan Principal Problem:   Aspiration pneumonia (HCC) Active Problems:   Aortic stenosis   Memory loss   Permanent atrial fibrillation (HCC)   Type 2 diabetes mellitus without complication (HCC)   Essential hypertension   Aspiration PNA -Given apparent episode of choking, advanced dementia, and infiltrate in left lower lobe on chest x-ray, most likely aspiration pneumonia.  -CURB-65 score is 3, meaning that the patient has a 14.5% risk of death. -Pneumonia Severity Index (PSI) is Class 4, 9% mortality. -Corticosteroids have been to shown to low overall mortality rate; risk of ARDS; and need for mechanical ventilation.  This is particularly true in severe PNA (class 3+ PSI).  Will add 20 mg prednisone daily for 3 days. -The patient has the following criteria for MDR (multi-drug resistance):SNF placement. -The patient will need treatment for HCAP due to MDR risk factors as above; was treated with Cefepime and Vancomycin in the ER but will change Cefepime to Zosyn for better aspiration coverage as inpatient. -Additional complicating factors include:dementia -NS @ 75cc/hr -Fever  control -Repeat CBC in am -Sputum cultures -Blood cultures -Strep pneumo testing -Will order lower respiratory tract procalcitonin level.  Antibiotics would not be indicated for PCT <0.1 and probably should not be used for < 0.25.  >0.5 indicates infection and >>0.5 indicates  more serious disease.  As the procalcitonin level normalizes, it will be reasonable to consider de-escalation of antibiotic coverage.  Aortic stenosis -Known moderate to severe disease -Has chosen no intervention  Dementia -Continue Aricept and Namenda -Family counseled about how patients with dementia often do better behaviorally while hospitalized if family members are present overnight -He is on Xanax and Depakote, presumably for behavioral issues associated with dementia -Continue Melatonin  Afib -Rate controlled at time of presentation and throughout ER time -He is on Eliquis -His HAS-BLED score is 34, meaning 8.7% bleeding rate/year; this does not take into account his dementia and the associated increased fall risk -His CHA2DS2-VASc score is 4, with a 4% stroke rate/year -At some point in the near future, it may be reasonable to consider discontinuation of even low-dose anticoagulation.  DM -Diet controlled -Will follow with fasting AM labs -It is unlikely that he will need acute or chronic treatment for this issue  HTN -Continue Lopressor  DVT prophylaxis: Eliquis Code Status:  DNR - confirmed with family Family Communication: Daughters and son-in-law present throughout evaluation Disposition Plan:  Back to memory care facility once clinically improved Consults called: SW Admission status: Admit - It is my clinical opinion that admission to INPATIENT is reasonable and necessary because this patient will require at least 2 midnights in the hospital to treat this condition based on the medical complexity of the problems presented.  Given the aforementioned information, the predictability of an adverse  outcome is felt to be significant.     Jonah Blue MD [facility]  If note is complete, please contact covering daytime or nighttime physician. [email or web address] Password [email or web address]  04/04/2017, 9:05 PM

## 2017-04-05 ENCOUNTER — Other Ambulatory Visit: Payer: Self-pay

## 2017-04-05 LAB — CBC WITH DIFFERENTIAL/PLATELET
BASOS PCT: 0 %
Basophils Absolute: 0 10*3/uL (ref 0.0–0.1)
EOS ABS: 0 10*3/uL (ref 0.0–0.7)
Eosinophils Relative: 0 %
HCT: 36.8 % — ABNORMAL LOW (ref 39.0–52.0)
HEMOGLOBIN: 12.2 g/dL — AB (ref 13.0–17.0)
Lymphocytes Relative: 4 %
Lymphs Abs: 0.5 10*3/uL — ABNORMAL LOW (ref 0.7–4.0)
MCH: 33.9 pg (ref 26.0–34.0)
MCHC: 33.2 g/dL (ref 30.0–36.0)
MCV: 102.2 fL — ABNORMAL HIGH (ref 78.0–100.0)
Monocytes Absolute: 0.2 10*3/uL (ref 0.1–1.0)
Monocytes Relative: 2 %
NEUTROS PCT: 94 %
Neutro Abs: 11.8 10*3/uL — ABNORMAL HIGH (ref 1.7–7.7)
Platelets: 205 10*3/uL (ref 150–400)
RBC: 3.6 MIL/uL — AB (ref 4.22–5.81)
RDW: 13.4 % (ref 11.5–15.5)
WBC: 12.5 10*3/uL — AB (ref 4.0–10.5)

## 2017-04-05 LAB — BASIC METABOLIC PANEL
Anion gap: 12 (ref 5–15)
BUN: 25 mg/dL — ABNORMAL HIGH (ref 6–20)
CALCIUM: 8.3 mg/dL — AB (ref 8.9–10.3)
CO2: 23 mmol/L (ref 22–32)
CREATININE: 1.31 mg/dL — AB (ref 0.61–1.24)
Chloride: 106 mmol/L (ref 101–111)
GFR, EST AFRICAN AMERICAN: 53 mL/min — AB (ref >60)
GFR, EST NON AFRICAN AMERICAN: 46 mL/min — AB (ref >60)
Glucose, Bld: 148 mg/dL — ABNORMAL HIGH (ref 65–99)
Potassium: 4.5 mmol/L (ref 3.5–5.1)
SODIUM: 141 mmol/L (ref 135–145)

## 2017-04-05 LAB — GLUCOSE, CAPILLARY: GLUCOSE-CAPILLARY: 137 mg/dL — AB (ref 65–99)

## 2017-04-05 MED ORDER — SODIUM CHLORIDE 0.9 % IV SOLN
3.0000 g | Freq: Three times a day (TID) | INTRAVENOUS | Status: DC
  Administered 2017-04-05 – 2017-04-06 (×4): 3 g via INTRAVENOUS
  Filled 2017-04-05 (×7): qty 3

## 2017-04-05 MED ORDER — RESOURCE THICKENUP CLEAR PO POWD
ORAL | Status: DC | PRN
  Filled 2017-04-05: qty 125

## 2017-04-05 MED ORDER — ORAL CARE MOUTH RINSE
15.0000 mL | Freq: Two times a day (BID) | OROMUCOSAL | Status: DC
  Administered 2017-04-05 – 2017-04-11 (×5): 15 mL via OROMUCOSAL

## 2017-04-05 NOTE — Progress Notes (Addendum)
Patient refused AM labs to be drawn, even with daughter's encouragement. Patient did cooperate when lab tech returned at a later time. Patient was incontinent of urine x2. Condom cath placed. Patient has not urinated since, so urine specimen not collected. Will pass on to next shift. Daughter unsure if patient has had flu or pneumonia vaccine. Daughter states that other daughter, Marylu LundJanet, will know. Will pass on to next shift. Patient resting quietly in bed at this time. Daughter in room.

## 2017-04-05 NOTE — Evaluation (Signed)
Clinical/Bedside Swallow Evaluation Patient Details  Name: Aaron [name] MRN: [id number] Date of Birth: 04/11/1924  Today's Date: 04/05/2017 Time: SLP Start Time (ACUTE ONLY): 1100 SLP Stop Time (ACUTE ONLY): 1142 SLP Time Calculation (min) (ACUTE ONLY): 42 min  Past Medical History:  Past Medical History:  Diagnosis Date  . Aortic stenosis    Probable moderate with discordant gradient (mild) and area (severe); refused replacement  . Coronary atherosclerosis of native coronary artery    a. S/P CABG 1998,  b. PCI to CFX with BMS 2008 => ISR => s/p Cypher DES + POBA;  c. inf STEMI 8/14: LHC 11/12/12:  LAD occluded at the origin, mid CFX stent patent, proximal PDA 60-70%, LIMA-LAD patent. Unclear culprit for his inferior STEMI => med Rx (? embolic from AFib)  . Dementia   . Diabetes mellitus without complication (HCC)   . Essential hypertension, benign   . Gout   . Hyperlipidemia   . Permanent atrial fibrillation (HCC)    on Eliquis  . STEMI (ST elevation myocardial infarction) [facility](HCC)    Past Surgical History:  Past Surgical History:  Procedure Laterality Date  . AORTOGRAM  11/12/2012   Procedure: AORTOGRAM;  Surgeon: Runell GessJonathan J Berry, MD;  [facility];  Service: Cardiovascular;;  . CARDIAC CATHETERIZATION  11/12/2012   Occluded ostial LAD, patent LIMA-LAD, patent mid LCx stent, 60-70% prox PDA stenosis, mild ascending aortic dilatation w/o dissection or AV insufficiency; unclear culprit lesion- ? cardioembolic vs plaque lysis  . CORONARY ARTERY BYPASS GRAFT  09/07/1996   LIMA TO LAD - minimally invasive off-pump procedure  . HERNIA REPAIR    . INGUINAL HERNIA REPAIR     HPI:  Aaron [name] is a 83 y.o. male with medical history significant of CAD; afib; HLD; HTN; DM; and dementia presenting after an apparent aspiration episode.  He was at the facility and appeared to get choked at lunchtime and vomited.  He seemed to be okay and was walking to his room and vomited  again.  At that time they called 911 to send him in.  He vomited more en route.  No known SOB.  He has not had known trouble swallowing in the past, but they did modify his diet in case. He eats very little.  No fever.  +cough, chronic, takes Mucinex and nasal spray for this.     Assessment / Plan / Recommendation Clinical Impression  Clinical swallow evaluation completed at bedside with daughter present (visiting from [place]). [facility]. His daughter reports that Pt has had several dental extractions and they have modified his diet some. She also reports a history of Pt coughing periodically during meals and difficulty expectorating phlegm. Pt pleasantly confused and willing to participate in swallow evaluation with SLP. Pt presents with mild/mod oral phase and suspected mild pharyngeal phase dysphagia characterized by decreased labial seal with thins, decreased oral awareness with po, prolonged oral prep with solids, and suspected delay in swallow initiation with some coughing noted with thin liquids. Pt with improved oral control and performance with nectar-thick liquids. Recommend D2 and NTL and po meds crushed as able in puree. SLP to follow during acute stay as Pt may benefit from MBSS prior to D/C. Above discussed with Pt's daughter and she is in agreement with plan of care. SLP to follow.  SLP Visit Diagnosis: Dysphagia, oropharyngeal phase (R13.12)    Aspiration Risk  Mild aspiration risk;Risk for inadequate nutrition/hydration  Diet Recommendation Dysphagia 2 (Fine chop);Nectar-thick liquid   Liquid Administration via: Cup;Straw Medication Administration: Crushed with puree Supervision: Staff to assist with self feeding;Patient able to self feed;Full supervision/cueing for compensatory strategies Compensations: Minimize environmental distractions;Slow rate;Small sips/bites Postural Changes: Seated upright at 90 degrees;Remain upright for at least  30 minutes after po intake    Other  Recommendations Oral Care Recommendations: Oral care BID;Staff/trained caregiver to provide oral care Other Recommendations: Order thickener from pharmacy;Clarify dietary restrictions;Remove water pitcher;Prohibited food (jello, ice cream, thin soups)   Follow up Recommendations Home health SLP;Skilled [facility]      Frequency and Duration min 2x/week  1 week       Prognosis Prognosis for Safe Diet Advancement: Fair Barriers to Reach Goals: Cognitive deficits      Swallow Study   General Date of Onset: 04/04/17 HPI: Aaron [name] is a 83 y.o. male with medical history significant of CAD; afib; HLD; HTN; DM; and dementia presenting after an apparent aspiration episode.  He was at the facility and appeared to get choked at lunchtime and vomited.  He seemed to be okay and was walking to his room and vomited again.  At that time they called 911 to send him in.  He vomited more en route.  No known SOB.  He has not had known trouble swallowing in the past, but they did modify his diet in case. He eats very little.  No fever.  +cough, chronic, takes Mucinex and nasal spray for this.   Type of Study: Bedside Swallow Evaluation Previous Swallow Assessment: None on record Diet Prior to this Study: NPO Temperature Spikes Noted: No Respiratory Status: Nasal cannula History of Recent Intubation: No Behavior/Cognition: Alert;Cooperative;Confused Oral Cavity Assessment: Within Functional Limits Oral Care Completed by SLP: Yes Oral Cavity - Dentition: Missing dentition;Adequate natural dentition(recent extractions from dentist) Vision: Functional for self-feeding Self-Feeding Abilities: Able to feed self;Needs assist;Needs set up Patient Positioning: Upright in bed Baseline Vocal Quality: Normal Volitional Cough: Strong Volitional Swallow: Able to elicit    Oral/Motor/Sensory Function Overall Oral Motor/Sensory Function: Within  functional limits   Ice Chips Ice chips: Within functional limits Presentation: Spoon   Thin Liquid Thin Liquid: Impaired Presentation: Cup;Self Fed;Straw Oral Phase Impairments: Reduced labial seal Oral Phase Functional Implications: Right anterior spillage Pharyngeal  Phase Impairments: Suspected delayed Swallow;Cough - Immediate    Nectar Thick Nectar Thick Liquid: Within functional limits Presentation: Cup;Self Fed;Spoon;Straw   Honey Thick Honey Thick Liquid: Not tested   Puree Puree: Within functional limits Presentation: Spoon   Solid   GO   Solid: Impaired Presentation: Spoon Oral Phase Impairments: Impaired mastication Oral Phase Functional Implications: Prolonged oral transit       Thank you,  Havery MorosDabney Fatih Stalvey, CCC-SLP [phone]  Jood Retana 04/05/2017,12:33 PM

## 2017-04-05 NOTE — Clinical Social Work Note (Addendum)
Clinical Social Work Assessment  Patient Details  Name: Aaron [name] MRN: [id number] Date of Birth: 12/30/1923  Date of referral:  04/05/17               Reason for consult:  Discharge Planning                Permission sought to share information with:    Permission granted to share information::     Name::        Agency::     Relationship::     Contact Information:     Housing/Transportation Living arrangements for the past 2 months:  Assisted Living Facility([name]) Source of Information:  Facility Patient Interpreter Needed:  None Criminal Activity/Legal Involvement Pertinent to Current Situation/Hospitalization:  No - Comment as needed Significant Relationships:  Adult Children Lives with:  Facility Resident Do you feel safe going back to the place where you live?  Yes Need for family participation in patient care:  Yes (Comment)  Care giving concerns: [name]   Social Worker assessment / plan: Pt is a 83 year old male [facility] with aspiration [facility]. [name] who stated that pt has dementia. He is ambulatory with a walker. Staff assist with dressing, bathing, toileting, grooming. Pt feeds himself. He has two very supportive daughters. Pt is on a textured diet at family request as pt has been having teeth removed. Pt will return to [facility] at dc unless he ends up needing short term SNF rehab in which case CSW will work with family to refer.  Employment status:  Retired Health and safety inspectornsurance information:  Medicare PT Recommendations:  Not assessed at this time Information / Referral to community resources:     Patient/Family's Response to care: Accepting of care.  Patient/Family's Understanding of and Emotional Response to Diagnosis, Current Treatment, and Prognosis: Pt with dementia.  Emotional Assessment Appearance:  Appears stated age Attitude/Demeanor/Rapport:    Affect (typically  observed):  Calm Orientation:  Oriented to Self Alcohol / Substance use:  Not Applicable Psych involvement (Current and /or in the community):  No (Comment)  Discharge Needs  Concerns to be addressed:  Discharge Planning Concerns Readmission within the last 30 days:  No Current discharge risk:  None Barriers to Discharge:  No Barriers Identified   Aaron GaultKathleen Lyndi Holbein, LCSW 04/05/2017, 10:36 AM

## 2017-04-05 NOTE — Progress Notes (Signed)
Patient was sleeping. This RN and Dod[place], NT woke patient to complete incontinence care and place condom cath. Patient became agitated and combative. Daughter, Arline AspCindy, in room with patient. Daughter calmed patient. Then, patient cooperative with care.

## 2017-04-05 NOTE — Progress Notes (Signed)
PROGRESS NOTE    Aaron [name]  GUY:[id number] DOB: 10/31/1923 DOA: 04/04/2017 PCP: System, Provider Not In  Brief Narrative:  Aaron [name] is Aaron [name] 83 y.o. male with medical history significant of CAD; afib; HLD; HTN; DM; and dementia presenting after an apparent aspiration episode.  He was at the facility and appeared to get choked at lunchtime and vomited.  He seemed to be okay and was walking to his room and vomited again.  At that time they called 911 to send him in.  He vomited more en route.  No known SOB.  He has not had known trouble swallowing in the past, but they did modify his diet in case. He eats very little.  No fever.  +cough, chronic, takes Mucinex and nasal spray for this.    Assessment & Plan:   Principal Problem:   Aspiration pneumonia (HCC) Active Problems:   Aortic stenosis   Memory loss   Permanent atrial fibrillation (HCC)   Type 2 diabetes mellitus without complication (HCC)   Essential hypertension   Aspiration PNA -Given apparent episode of choking, advanced dementia, and infiltrate in left lower lobe on chest x-ray, most likely aspiration pneumonia.  Per nursing, continues to cough with sips with meds. [ ]  Speech therapy, appreciate recs -CURB-65 score is 3, meaning that the patient has [name] 14.5% risk of death. -Pneumonia Severity Index (PSI) is Class 4, 9% mortality. -Corticosteroids have been to shown to low overall mortality rate; risk of ARDS; and need for mechanical ventilation.  This is particularly true in severe PNA (class 3+ PSI).  Will add 20 mg prednisone daily for 3 days. -Given story, will narrow from vanc/zosyn to unasyn -NS @ 75cc/hr -Fever control -Repeat CBC in am, WBC slightly higher, but likely due to steroids -Sputum cultures -Blood cultures -Strep pneumo testing -PCT 0.25  Aortic stenosis -Known moderate to severe disease -Has chosen no intervention  Dementia -Continue Aricept and Namenda -Family counseled about how  patients with dementia often do better behaviorally while hospitalized if family members are present overnight -He is on Xanax and Depakote, presumably for behavioral issues associated with dementia -Continue Melatonin  Afib -Rate controlled at time of presentation and throughout ER time -He is on Eliquis  DM -Diet controlled -Will follow with fasting AM labs -It is unlikely that he will need acute or chronic treatment for this issue  HTN -Continue Lopressor  CKD Stage III: Cr at baseline  DVT prophylaxis: eliquis Code Status: DNR Family Communication: daughter at bedside Disposition Plan: pending  Consultants:   none  Procedures: (Don't include imaging studies which can be auto populated. Include things that cannot be auto populated i.e. Echo, Carotid and venous dopplers, Foley, Bipap, HD, tubes/drains, wound vac, central lines etc)  none  Antimicrobials: (specify start and planned stop date. Auto populated tables are space occupying and do not give end dates) Anti-infectives (From admission, onward)   Start     Dose/Rate Route Frequency Ordered Stop   04/05/17 1600  ceFEPIme (MAXIPIME) 1 g in dextrose 5 % 50 mL IVPB  Status:  Discontinued     1 g 100 mL/hr over 30 Minutes Intravenous Every 24 hours 04/04/17 1715 04/04/17 1915   04/05/17 1600  vancomycin (VANCOCIN) IVPB 1000 mg/200 mL premix  Status:  Discontinued     1,000 mg 200 mL/hr over 60 Minutes Intravenous Every 24 hours 04/04/17 1715 04/05/17 0844   04/04/17 1930  piperacillin-tazobactam (ZOSYN) IVPB 3.375 g     3.375  g 12.5 mL/hr over 240 Minutes Intravenous Every 8 hours 04/04/17 1929     04/04/17 1600  ceFEPIme (MAXIPIME) 2 g in dextrose 5 % 50 mL IVPB     2 g 100 mL/hr over 30 Minutes Intravenous  Once 04/04/17 1550 04/04/17 1657   04/04/17 1600  vancomycin (VANCOCIN) IVPB 1000 mg/200 mL premix     1,000 mg 200 mL/hr over 60 Minutes Intravenous  Once 04/04/17 1550 04/04/17 1804       Subjective: Confused.   No CP.  No discomfort. [name]&Ox1.  Objective: Vitals:   04/04/17 1908 04/04/17 1916 04/04/17 2100 04/05/17 0500  BP: (!) 168/103  (!) 151/80 (!) 124/55  Pulse: 80  81 65  Resp: 20  20 20   Temp: 97.7 F (36.5 C)  98 F (36.7 C) 97.8 F (36.6 C)  TempSrc: Oral  Oral Oral  SpO2: 95% 95% 95% 100%  Weight:      Height:        Intake/Output Summary (Last 24 hours) at 04/05/2017 0845 Last data filed at 04/05/2017 0740 Gross per 24 hour  Intake 1280 ml  Output -  Net 1280 ml   Filed Weights   04/04/17 1500  Weight: 63.5 kg (140 lb)    Examination:  General exam: Appears calm and comfortable  Respiratory system: Clear to auscultation. Respiratory effort normal.  Crackles to L lower lung field.  Cardiovascular system: S1 & S2 heard, RRR. Distant heart sounds, no appreciated murmurs, gallops rubs.. Gastrointestinal system: Abdomen is nondistended, soft and nontender. No organomegaly or masses felt. Normal bowel sounds heard. Central nervous system: Alert and oriented x1. No focal neurological deficits. Extremities: no LEE Skin: No rashes, lesions or ulcers Psychiatry: Judgement and insight appear normal. Mood & affect appropriate.     Data Reviewed: I have personally reviewed following labs and imaging studies  CBC: Recent Labs  Lab 04/04/17 1512 04/05/17 0709  WBC 11.2* 12.5*  NEUTROABS 5.8 11.8*  HGB 13.2 12.2*  HCT 41.7 36.8*  MCV 105.8* 102.2*  PLT 227 205   Basic Metabolic Panel: Recent Labs  Lab 04/04/17 1512 04/05/17 0709  NA 141 141  K 3.9 4.5  CL 103 106  CO2 25 23  GLUCOSE 191* 148*  BUN 25* 25*  CREATININE 1.44* 1.31*  CALCIUM 8.7* 8.3*   GFR: Estimated Creatinine Clearance: 33 mL/min ([name]) (by C-G formula based on SCr of 1.31 mg/dL (H)). Liver Function Tests: No results for input(s): AST, ALT, ALKPHOS, BILITOT, PROT, ALBUMIN in the last 168 hours. No results for input(s): LIPASE, AMYLASE in the last 168 hours. No  results for input(s): AMMONIA in the last 168 hours. Coagulation Profile: No results for input(s): INR, PROTIME in the last 168 hours. Cardiac Enzymes: Recent Labs  Lab 04/04/17 1512  TROPONINI <0.03   BNP (last 3 results) No results for input(s): PROBNP in the last 8760 hours. HbA1C: No results for input(s): HGBA1C in the last 72 hours. CBG: Recent Labs  Lab 04/04/17 2057  GLUCAP 140*   Lipid Profile: No results for input(s): CHOL, HDL, LDLCALC, TRIG, CHOLHDL, LDLDIRECT in the last 72 hours. Thyroid Function Tests: No results for input(s): TSH, T4TOTAL, FREET4, T3FREE, THYROIDAB in the last 72 hours. Anemia Panel: No results for input(s): VITAMINB12, FOLATE, FERRITIN, TIBC, IRON, RETICCTPCT in the last 72 hours. Sepsis Labs: Recent Labs  Lab 04/04/17 1957  PROCALCITON 0.25    Recent Results (from the past 240 hour(s))  Culture, blood (routine x 2) Call MD if  unable to obtain prior to antibiotics being given     Status: None (Preliminary result)   Collection Time: 04/04/17  7:51 PM  Result Value Ref Range Status   Specimen Description BLOOD RIGHT ARM  Final   Special Requests   Final    BOTTLES DRAWN AEROBIC ONLY Blood Culture results may not be optimal due to an inadequate volume of blood received in culture bottles   Culture NO GROWTH < 12 HOURS  Final   Report Status PENDING  Incomplete  Culture, blood (routine x 2) Call MD if unable to obtain prior to antibiotics being given     Status: None (Preliminary result)   Collection Time: 04/04/17  7:57 PM  Result Value Ref Range Status   Specimen Description LEFT ANTECUBITAL  Final   Special Requests   Final    Blood Culture adequate volume Blood Culture adequate volume   Culture NO GROWTH < 12 HOURS  Final   Report Status PENDING  Incomplete  MRSA PCR Screening     Status: None   Collection Time: 04/04/17  9:29 PM  Result Value Ref Range Status   MRSA by PCR NEGATIVE NEGATIVE Final    Comment:        The GeneXpert  MRSA Assay (FDA approved for NASAL specimens only), is one component of De[facility] Wattenbarger comprehensive MRSA colonization surveillance program. It is not intended to diagnose MRSA infection nor to guide or monitor treatment for MRSA infections.          Radiology Studies: Dg Chest Port 1 View  Result Date: 04/04/2017 CLINICAL DATA:  Job CT lunch.  Possible aspiration. EXAM: PORTABLE CHEST 1 VIEW COMPARISON:  08/16/2016 FINDINGS: There is opacity at the left lung base silhouetting the left hemidiaphragm. This is consistent with aspiration pneumonitis. Remainder of the lungs is clear. Possible left pleural effusion. No right pleural effusion. No pneumothorax. There stable changes from Ferne Ellingwood previous median sternotomy. Cardiac silhouette is mildly enlarged. No mediastinal or hilar masses. Skeletal structures are grossly intact. IMPRESSION: 1. Left lung base opacity consistent with aspiration pneumonitis given the patient's history. Electronically Signed   By: Amie Portland M.D.   On: 04/04/2017 15:24        Scheduled Meds: . allopurinol  100 mg Oral Daily  . apixaban  2.5 mg Oral BID  . divalproex  125 mg Oral QHS  . donepezil  10 mg Oral QHS  . feeding supplement (ENSURE ENLIVE)  237 mL Oral BID  . loratadine  10 mg Oral Daily  . mouth rinse  15 mL Mouth Rinse BID  . memantine  5 mg Oral BID  . metoprolol tartrate  37.5 mg Oral Daily  . predniSONE  20 mg Oral Q breakfast   Continuous Infusions: . sodium chloride 75 mL/hr at 04/04/17 1916  . piperacillin-tazobactam (ZOSYN)  IV Stopped (04/05/17 0740)     LOS: 1 day    Time spent: over 30 minutes    Lacretia Nicks, MD [facility] Pager [phone]  If 7PM-7AM, please contact night-coverage [email or web address] Password [email or web address] 04/05/2017, 8:45 AM

## 2017-04-05 NOTE — Progress Notes (Signed)
Pharmacy Antibiotic Note  Aaron [name] is a 83 y.o. male admitted on 04/04/2017 with pneumonia.  Pharmacy has been consulted for Unasyn dosing.  Plan: PCN allergy noted, previously on Zosyn Unasyn 3gm IV every 8 hours. Monitor labs, micro and vitals.   Height: 5\' 8"  (172.7 cm) Weight: 140 lb (63.5 kg) IBW/kg (Calculated) : 68.4  Temp (24hrs), Avg:97.9 F (36.6 C), Min:97.7 F (36.5 C), Max:98 F (36.7 C)  Recent Labs  Lab 04/04/17 1512 04/05/17 0709  WBC 11.2* 12.5*  CREATININE 1.44* 1.31*    Estimated Creatinine Clearance: 33 mL/min (A) (by C-G formula based on SCr of 1.31 mg/dL (H)).    Allergies  Allergen Reactions  . Penicillins Other (See Comments)    Unknown. Has patient had a PCN reaction causing immediate rash, facial/tongue/throat swelling, SOB or lightheadedness with hypotension: No / unknown  Has patient had a PCN reaction causing severe rash involving mucus membranes or skin necrosis: No/ unknown Has patient had a PCN reaction that required hospitalization No/ unknown Has patient had a PCN reaction occurring within the last 10 years: No If all of the above answers are "NO", then may proceed with Cephalosporin use.     Antimicrobials this admission:  vanc 12/23 >> 12/24 cefepime 12/23 >> 12/23 Zosyn  12/23>>12/24 Unasyn 12/24 >>  Dose adjustments this admission:  n/a   Microbiology results:  12/23 BCx: pending  UCx:    Sputum:   12/23 MRSA PCR: (-)  Thank you for allowing pharmacy to be a part of this patient's care.  Mady GemmaHayes, Soha Thorup R 04/05/2017 9:14 AM

## 2017-04-06 LAB — CBC
HCT: 35.9 % — ABNORMAL LOW (ref 39.0–52.0)
Hemoglobin: 11.7 g/dL — ABNORMAL LOW (ref 13.0–17.0)
MCH: 33.6 pg (ref 26.0–34.0)
MCHC: 32.6 g/dL (ref 30.0–36.0)
MCV: 103.2 fL — ABNORMAL HIGH (ref 78.0–100.0)
PLATELETS: 204 10*3/uL (ref 150–400)
RBC: 3.48 MIL/uL — AB (ref 4.22–5.81)
RDW: 13.5 % (ref 11.5–15.5)
WBC: 16.2 10*3/uL — AB (ref 4.0–10.5)

## 2017-04-06 LAB — BASIC METABOLIC PANEL
ANION GAP: 9 (ref 5–15)
BUN: 34 mg/dL — ABNORMAL HIGH (ref 6–20)
CO2: 26 mmol/L (ref 22–32)
Calcium: 8.6 mg/dL — ABNORMAL LOW (ref 8.9–10.3)
Chloride: 108 mmol/L (ref 101–111)
Creatinine, Ser: 1.4 mg/dL — ABNORMAL HIGH (ref 0.61–1.24)
GFR calc Af Amer: 49 mL/min — ABNORMAL LOW (ref >60)
GFR, EST NON AFRICAN AMERICAN: 42 mL/min — AB (ref >60)
Glucose, Bld: 147 mg/dL — ABNORMAL HIGH (ref 65–99)
POTASSIUM: 4.3 mmol/L (ref 3.5–5.1)
SODIUM: 143 mmol/L (ref 135–145)

## 2017-04-06 LAB — GLUCOSE, CAPILLARY
Glucose-Capillary: 101 mg/dL — ABNORMAL HIGH (ref 65–99)
Glucose-Capillary: 112 mg/dL — ABNORMAL HIGH (ref 65–99)
Glucose-Capillary: 118 mg/dL — ABNORMAL HIGH (ref 65–99)
Glucose-Capillary: 172 mg/dL — ABNORMAL HIGH (ref 65–99)

## 2017-04-06 MED ORDER — CEFPODOXIME PROXETIL 200 MG PO TABS
200.0000 mg | ORAL_TABLET | Freq: Every day | ORAL | Status: DC
  Filled 2017-04-06 (×3): qty 1

## 2017-04-06 MED ORDER — HALOPERIDOL LACTATE 5 MG/ML IJ SOLN
1.0000 mg | Freq: Once | INTRAMUSCULAR | Status: AC
  Administered 2017-04-06: 1 mg via INTRAVENOUS
  Filled 2017-04-06: qty 1

## 2017-04-06 MED ORDER — INFLUENZA VAC SPLIT HIGH-DOSE 0.5 ML IM SUSY
0.5000 mL | PREFILLED_SYRINGE | INTRAMUSCULAR | Status: DC
  Filled 2017-04-06 (×2): qty 0.5

## 2017-04-06 MED ORDER — CEFPODOXIME PROXETIL 200 MG PO TABS
200.0000 mg | ORAL_TABLET | Freq: Every day | ORAL | Status: AC
  Administered 2017-04-07 – 2017-04-08 (×2): 200 mg via ORAL
  Filled 2017-04-06 (×3): qty 1

## 2017-04-06 MED ORDER — METRONIDAZOLE 500 MG PO TABS
500.0000 mg | ORAL_TABLET | Freq: Three times a day (TID) | ORAL | Status: AC
  Administered 2017-04-07 – 2017-04-08 (×6): 500 mg via ORAL
  Filled 2017-04-06 (×7): qty 1

## 2017-04-06 NOTE — Progress Notes (Addendum)
PROGRESS NOTE    Delphia GratesLetcher E Heath  ZOX:[id number] DOB: 12/30/1923 DOA: 04/04/2017 PCP: System, Provider Not In  Brief Narrative:  Delphia GratesLetcher E Elsey is Ciel Yanes 83 y.o. male with medical history significant of CAD; afib; HLD; HTN; DM; and dementia presenting after an apparent aspiration episode.  He was at the facility and appeared to get choked at lunchtime and vomited.  He seemed to be okay and was walking to his room and vomited again.  At that time they called 911 to send him in.  He vomited more en route.  No known SOB.  He has not had known trouble swallowing in the past, but they did modify his diet in case. He eats very little.  No fever.  +cough, chronic, takes Mucinex and nasal spray for this.    Assessment & Plan:   Principal Problem:   Aspiration pneumonia (HCC) Active Problems:   Aortic stenosis   Memory loss   Permanent atrial fibrillation (HCC)   Type 2 diabetes mellitus without complication (HCC)   Essential hypertension   Aspiration PNA -Given apparent episode of choking, advanced dementia, and infiltrate in left lower lobe on chest x-ray, most likely aspiration pneumonia.   [ ]  Speech therapy, appreciate recs - persistent aspiration today with cough with eating with daughter, will f/u speech recommendations tomorrow, possible MBS? -CURB-65 score is 3, meaning that the patient has Kindle Strohmeier 14.5% risk of death. -Pneumonia Severity Index (PSI) is Class 4, 9% mortality. -Corticosteroids have been to shown to low overall mortality rate; risk of ARDS; and need for mechanical ventilation.  This is particularly true in severe PNA (class 3+ PSI).  Will add 20 mg prednisone daily for 3 days. -Given story, will narrow from vanc/zosyn to unasyn -> vantin/flagyl (of note, pt with charted pcn allergy - he received zosyn initially and has tolerated unasyn well.  Discussed this with pt daughter and discussed we would switch to different abx PO).  -NS @ 75cc/hr -Fever control -Repeat CBC in am,  WBC slightly higher, but likely due to steroids -Sputum cultures -Blood cultures -Strep pneumo testing -PCT 0.25  Aortic stenosis -Known moderate to severe disease -Has chosen no intervention  Dementia -Continue Aricept and Namenda -Family counseled about how patients with dementia often do better behaviorally while hospitalized if family members are present overnight -He is on Xanax and Depakote, presumably for behavioral issues associated with dementia -Continue Melatonin  Afib -Rate controlled at time of presentation and throughout ER time -He is on Eliquis  DM -Diet controlled -Will follow with fasting AM labs -It is unlikely that he will need acute or chronic treatment for this issue  HTN -Continue Lopressor  CKD Stage III: Cr fluctuating, continue to monitor  DVT prophylaxis: eliquis Code Status: DNR Family Communication: daughter at bedside Disposition Plan: awaiting speech eval for possible MBS then likely d/c back to facility  Consultants:   none  Procedures: (Don't include imaging studies which can be auto populated. Include things that cannot be auto populated i.e. Echo, Carotid and venous dopplers, Foley, Bipap, HD, tubes/drains, wound vac, central lines etc)  none  Antimicrobials: (specify start and planned stop date. Auto populated tables are space occupying and do not give end dates) Anti-infectives (From admission, onward)   Start     Dose/Rate Route Frequency Ordered Stop   04/05/17 1600  ceFEPIme (MAXIPIME) 1 g in dextrose 5 % 50 mL IVPB  Status:  Discontinued     1 g 100 mL/hr over 30 Minutes Intravenous  Every 24 hours 04/04/17 1715 04/04/17 1915   04/05/17 1600  vancomycin (VANCOCIN) IVPB 1000 mg/200 mL premix  Status:  Discontinued     1,000 mg 200 mL/hr over 60 Minutes Intravenous Every 24 hours 04/04/17 1715 04/05/17 0844   04/05/17 1000  Ampicillin-Sulbactam (UNASYN) 3 g in sodium chloride 0.9 % 100 mL IVPB     3 g 200 mL/hr over 30  Minutes Intravenous Every 8 hours 04/05/17 0929     04/04/17 1930  piperacillin-tazobactam (ZOSYN) IVPB 3.375 g  Status:  Discontinued     3.375 g 12.5 mL/hr over 240 Minutes Intravenous Every 8 hours 04/04/17 1929 04/05/17 0853   04/04/17 1600  ceFEPIme (MAXIPIME) 2 g in dextrose 5 % 50 mL IVPB     2 g 100 mL/hr over 30 Minutes Intravenous  Once 04/04/17 1550 04/04/17 1657   04/04/17 1600  vancomycin (VANCOCIN) IVPB 1000 mg/200 mL premix     1,000 mg 200 mL/hr over 60 Minutes Intravenous  Once 04/04/17 1550 04/04/17 1804      Subjective: Dane Bloch&Ox1. No CP or SOB.  Objective: Vitals:   04/05/17 0500 04/05/17 1447 04/05/17 2120 04/06/17 0710  BP: (!) 124/55 (!) 90/50 138/76 (!) 155/83  Pulse: 65 (!) 56 70 76  Resp: 20 20 20 20   Temp: 97.8 F (36.6 C) 97.8 F (36.6 C) 97.9 F (36.6 C) 98 F (36.7 C)  TempSrc: Oral Oral Axillary Axillary  SpO2: 100% 100% 99% 97%  Weight:      Height:        Intake/Output Summary (Last 24 hours) at 04/06/2017 1050 Last data filed at 04/06/2017 0900 Gross per 24 hour  Intake 2092.5 ml  Output -  Net 2092.5 ml   Filed Weights   04/04/17 1500  Weight: 63.5 kg (140 lb)    Examination:  General: No acute distress. Cardiovascular: Heart sounds show Sun Kihn regular rate, and rhythm. No gallops or rubs. No murmurs. No JVD. Lungs: Clear to auscultation bilaterally with good air movement. No rales, rhonchi or wheezes. Abdomen: Soft, nontender, nondistended with normal active bowel sounds. No masses. No hepatosplenomegaly. Neurological: Alert and oriented 1. Moves all extremities 4 with equal strength. Cranial nerves II through XII grossly intact. Skin: Warm and dry. No rashes or lesions. Extremities: No clubbing or cyanosis. No edema. Psychiatric: Mood and affect are normal. Insight and judgment are impaired.   Data Reviewed: I have personally reviewed following labs and imaging studies  CBC: Recent Labs  Lab 04/04/17 1512 04/05/17 0709  04/06/17 0919  WBC 11.2* 12.5* 16.2*  NEUTROABS 5.8 11.8*  --   HGB 13.2 12.2* 11.7*  HCT 41.7 36.8* 35.9*  MCV 105.8* 102.2* 103.2*  PLT 227 205 204   Basic Metabolic Panel: Recent Labs  Lab 04/04/17 1512 04/05/17 0709 04/06/17 0919  NA 141 141 143  K 3.9 4.5 4.3  CL 103 106 108  CO2 25 23 26   GLUCOSE 191* 148* 147*  BUN 25* 25* 34*  CREATININE 1.44* 1.31* 1.40*  CALCIUM 8.7* 8.3* 8.6*   GFR: Estimated Creatinine Clearance: 30.9 mL/min (Demarqus Jocson) (by C-G formula based on SCr of 1.4 mg/dL (H)). Liver Function Tests: No results for input(s): AST, ALT, ALKPHOS, BILITOT, PROT, ALBUMIN in the last 168 hours. No results for input(s): LIPASE, AMYLASE in the last 168 hours. No results for input(s): AMMONIA in the last 168 hours. Coagulation Profile: No results for input(s): INR, PROTIME in the last 168 hours. Cardiac Enzymes: Recent Labs  Lab 04/04/17 1512  TROPONINI <0.03   BNP (last 3 results) No results for input(s): PROBNP in the last 8760 hours. HbA1C: No results for input(s): HGBA1C in the last 72 hours. CBG: Recent Labs  Lab 04/04/17 2057 04/05/17 2120 04/06/17 0740  GLUCAP 140* 137* 101*   Lipid Profile: No results for input(s): CHOL, HDL, LDLCALC, TRIG, CHOLHDL, LDLDIRECT in the last 72 hours. Thyroid Function Tests: No results for input(s): TSH, T4TOTAL, FREET4, T3FREE, THYROIDAB in the last 72 hours. Anemia Panel: No results for input(s): VITAMINB12, FOLATE, FERRITIN, TIBC, IRON, RETICCTPCT in the last 72 hours. Sepsis Labs: Recent Labs  Lab 04/04/17 1957  PROCALCITON 0.25    Recent Results (from the past 240 hour(s))  Culture, blood (routine x 2) Call MD if unable to obtain prior to antibiotics being given     Status: None (Preliminary result)   Collection Time: 04/04/17  7:51 PM  Result Value Ref Range Status   Specimen Description BLOOD RIGHT ARM  Final   Special Requests   Final    BOTTLES DRAWN AEROBIC ONLY Blood Culture results may not be  optimal due to an inadequate volume of blood received in culture bottles   Culture NO GROWTH 2 DAYS  Final   Report Status PENDING  Incomplete  Culture, blood (routine x 2) Call MD if unable to obtain prior to antibiotics being given     Status: None (Preliminary result)   Collection Time: 04/04/17  7:57 PM  Result Value Ref Range Status   Specimen Description LEFT ANTECUBITAL  Final   Special Requests   Final    BOTTLES DRAWN AEROBIC AND ANAEROBIC Blood Culture adequate volume   Culture NO GROWTH 2 DAYS  Final   Report Status PENDING  Incomplete  MRSA PCR Screening     Status: None   Collection Time: 04/04/17  9:29 PM  Result Value Ref Range Status   MRSA by PCR NEGATIVE NEGATIVE Final    Comment:        The GeneXpert MRSA Assay (FDA approved for NASAL specimens only), is one component of Shaindel Sweeten comprehensive MRSA colonization surveillance program. It is not intended to diagnose MRSA infection nor to guide or monitor treatment for MRSA infections.          Radiology Studies: Dg Chest Port 1 View  Result Date: 04/04/2017 CLINICAL DATA:  Job CT lunch.  Possible aspiration. EXAM: PORTABLE CHEST 1 VIEW COMPARISON:  08/16/2016 FINDINGS: There is opacity at the left lung base silhouetting the left hemidiaphragm. This is consistent with aspiration pneumonitis. Remainder of the lungs is clear. Possible left pleural effusion. No right pleural effusion. No pneumothorax. There stable changes from Elly Haffey previous median sternotomy. Cardiac silhouette is mildly enlarged. No mediastinal or hilar masses. Skeletal structures are grossly intact. IMPRESSION: 1. Left lung base opacity consistent with aspiration pneumonitis given the patient's history. Electronically Signed   By: Amie Portland M.D.   On: 04/04/2017 15:24        Scheduled Meds: . allopurinol  100 mg Oral Daily  . apixaban  2.5 mg Oral BID  . divalproex  125 mg Oral QHS  . donepezil  10 mg Oral QHS  . feeding supplement (ENSURE  ENLIVE)  237 mL Oral BID  . [START ON 04/07/2017] Influenza vac split quadrivalent PF  0.5 mL Intramuscular Tomorrow-1000  . loratadine  10 mg Oral Daily  . mouth rinse  15 mL Mouth Rinse BID  . memantine  5 mg Oral BID  . metoprolol tartrate  37.5  mg Oral Daily  . predniSONE  20 mg Oral Q breakfast   Continuous Infusions: . sodium chloride 75 mL/hr at 04/05/17 2146  . ampicillin-sulbactam (UNASYN) IV Stopped (04/06/17 0328)     LOS: 2 days    Time spent: over 30 minutes    Lacretia Nicksaldwell Powell, MD [facility] Pager [phone]  If 7PM-7AM, please contact night-coverage [email or web address] Password [email or web address] 04/06/2017, 10:50 AM

## 2017-04-07 ENCOUNTER — Inpatient Hospital Stay (HOSPITAL_COMMUNITY): Payer: Medicare Other

## 2017-04-07 DIAGNOSIS — R933 Abnormal findings on diagnostic imaging of other parts of digestive tract: Secondary | ICD-10-CM

## 2017-04-07 DIAGNOSIS — R1314 Dysphagia, pharyngoesophageal phase: Secondary | ICD-10-CM

## 2017-04-07 LAB — CBC
HEMATOCRIT: 35 % — AB (ref 39.0–52.0)
Hemoglobin: 11.3 g/dL — ABNORMAL LOW (ref 13.0–17.0)
MCH: 33.4 pg (ref 26.0–34.0)
MCHC: 32.3 g/dL (ref 30.0–36.0)
MCV: 103.6 fL — AB (ref 78.0–100.0)
PLATELETS: 219 10*3/uL (ref 150–400)
RBC: 3.38 MIL/uL — ABNORMAL LOW (ref 4.22–5.81)
RDW: 13.7 % (ref 11.5–15.5)
WBC: 15.5 10*3/uL — ABNORMAL HIGH (ref 4.0–10.5)

## 2017-04-07 LAB — GLUCOSE, CAPILLARY
GLUCOSE-CAPILLARY: 103 mg/dL — AB (ref 65–99)
GLUCOSE-CAPILLARY: 152 mg/dL — AB (ref 65–99)
GLUCOSE-CAPILLARY: 102 mg/dL — AB (ref 65–99)
Glucose-Capillary: 111 mg/dL — ABNORMAL HIGH (ref 65–99)

## 2017-04-07 LAB — BASIC METABOLIC PANEL
Anion gap: 9 (ref 5–15)
BUN: 35 mg/dL — ABNORMAL HIGH (ref 6–20)
CALCIUM: 8.5 mg/dL — AB (ref 8.9–10.3)
CO2: 26 mmol/L (ref 22–32)
Chloride: 110 mmol/L (ref 101–111)
Creatinine, Ser: 1.14 mg/dL (ref 0.61–1.24)
GFR, EST NON AFRICAN AMERICAN: 54 mL/min — AB (ref >60)
GLUCOSE: 96 mg/dL (ref 65–99)
Potassium: 4.5 mmol/L (ref 3.5–5.1)
SODIUM: 145 mmol/L (ref 135–145)

## 2017-04-07 MED ORDER — HALOPERIDOL LACTATE 5 MG/ML IJ SOLN
1.0000 mg | Freq: Four times a day (QID) | INTRAMUSCULAR | Status: DC | PRN
  Administered 2017-04-07 – 2017-04-08 (×3): 1 mg via INTRAVENOUS
  Filled 2017-04-07 (×3): qty 1

## 2017-04-07 NOTE — Progress Notes (Signed)
Modified Barium Swallow Progress Note  Patient Details  Name: Aaron [name] MRN: [id number] Date of Birth: 07/08/1923  Today's Date: 04/07/2017  Modified Barium Swallow completed.  Full report located under Chart Review in the Imaging Section.  Brief recommendations include the following:  Clinical Impression  MBSS completed with Pt in the lateral position. Pt presents with mild oral phase and mild/mod pharyngeal phase and suspected primary esophageal phase dysphagia. Pt with mildly prolonged oral phase likely due to decreased dentition for mastication of solids. Pt with premature spillage of thin liquids to the level of the pyriforms with delay in swallow trigger, decreased epiglottic deflection, decreased tongue base retraction, and decreased vocal fold closure resulting in penetration during the swallow with thins and silent aspiration after the swallow with straw sips thin. Pt with pooling in the valleculae after the swallow after sips of NTL. Pharyngeal swallow essentially WNL for puree and HTL. Pt consumed barium tablet with cups sip NTL and barium tablet did not pass through GE junction despite ongoing presentations of liquids and puree. Additional bolus presented post barium tablet remained in thoracic esophagus and backflowed to cervical esophagus. Strongly recommend GI consult due to suspected narrowing near GE junction. Recommend D1/puree (for GI reasons) and NTL (for pharyngeal reasons) and [facility] Above to Dr. Lowell GuitarPowell and RN. Pt's daughter was present for the evaluation and imaging and recommendations were reviewed with her. SLP will follow.   Swallow Evaluation Recommendations   Recommended Consults: Consider GI evaluation   SLP Diet Recommendations: Dysphagia 1 (Puree) solids;Nectar thick liquid   Liquid Administration via: Cup;No straw   Medication Administration: Crushed with puree   Supervision: Patient able to self feed;Full supervision/cueing for  compensatory strategies   Compensations: Minimize environmental distractions;Slow rate;Small sips/bites   Postural Changes: Remain semi-upright after after feeds/meals (Comment);Seated upright at 90 degrees   Oral Care Recommendations: Oral care BID;Staff/trained caregiver to provide oral care   Other Recommendations: Order thickener from pharmacy;Clarify dietary restrictions;Remove water pitcher;Prohibited food (jello, ice cream, thin soups)   Thank you,  Aaron [name], CCC-SLP [phone]  Aaron [name] 04/07/2017,4:57 PM

## 2017-04-07 NOTE — Care Management Note (Signed)
Case Management Note  Patient Details  Name: Aaron [name] MRN: [id number] Date of Birth: 09/01/1923          Admitted with aspiration pneumonia. [facility]. DC back to facility is anticipated in next 24hrs. PT has seen pt and recommends HH PT at ALF. CM has spoken with daughter Marylu LundJanet, who is okay with HH PT [facility]. CM has contacted Crist InfanteSarah Napier, [facility], with referral for PT. Huntley DecSara will pull put info from Epic. Dtr aware HH has 48 hrs to resume services at DC. CSW will make arrangements for return to facility.           Expected Discharge Date:    04/07/2017              Expected Discharge Plan:  Assisted Living / Rest Home(with HH)  In-House Referral:  Clinical Social Work  Discharge planning Services  CM Consult  Post Acute Care Choice:  Home Health Choice offered to:  Patient  HH Arranged:  PT HH Agency[facility]  [facility]  Status of Service:  Completed, signed off  Malcolm MetroChildress, Myrick Mcnairy Demske, RN 04/07/2017, 2:19 PM

## 2017-04-07 NOTE — Plan of Care (Signed)
  Acute Rehab PT Goals(only PT should resolve) Pt Will Go Supine/Side To Sit 04/07/2017 1348 - Progressing by Ocie BobWatkins, Maresha Anastos, PT Flowsheets Taken 04/07/2017 1348  Pt will go Supine/Side to Sit with supervision Patient Will Transfer Sit To/From Stand 04/07/2017 1348 - Progressing by Ocie BobWatkins, Zakee Deerman, PT Flowsheets Taken 04/07/2017 1348  Patient will transfer sit to/from stand with supervision Pt Will Transfer Bed To Chair/Chair To Bed 04/07/2017 1348 - Progressing by Ocie BobWatkins, Krystina Strieter, PT Flowsheets Taken 04/07/2017 1348  Pt will Transfer Bed to Chair/Chair to Bed min guard assist Pt Will Ambulate 04/07/2017 1348 - Progressing by Ocie BobWatkins, Tameya Kuznia, PT Flowsheets Taken 04/07/2017 1348  Pt will Ambulate 75 feet;with min guard assist;with rolling walker  1:49 PM, 04/07/17 Ocie BobJames Earlene Bjelland, MPT Physical Therapist with [facility] 336 [phone] office [phone] mobile phone

## 2017-04-07 NOTE — Progress Notes (Addendum)
CSW spoke with patient's daughter Marylu Lund([name], [phone]) regarding possible discharge [facility] [phone]) today. She stated that she will be [facility] way [facility] make sure they have an updated care plan. She shared concerns that the patient has not been out of bed an she is worried he is deconditioned and he doesn't have a wheelchair at the ALF, only a walker. She will be coming to the hospital to see how the patient is doing, but she is not comfortable with him discharging when he is not able to walk. She will let CSW know if she needs [facility] [phone]). The med necessity form is on the chart just in case. This CSW is leaving for the day, so [facility], [place] [phone]) will be available remotely for discharge needs. MD aware. Patient's nurse contact: [phone]  Cristobal Goldmannadia Ife Vitelli LCSWA [phone]

## 2017-04-07 NOTE — Progress Notes (Signed)
PROGRESS NOTE    Aaron [name]  ZOX:[id number] DOB: [birth year] DOA: 04/04/2017 PCP: System, Provider Not In  Brief Narrative:  Aaron [name] is a 83 y.o. male with medical history significant of CAD; afib; HLD; HTN; DM; and dementia presenting after an apparent aspiration episode.  He was at the facility and appeared to get choked at lunchtime and vomited.  He seemed to be okay and was walking to his room and vomited again.  At that time they called 911 to send him in.  He vomited more en route.  No known SOB.  He has not had known trouble swallowing in the past, but they did modify his diet in case. He eats very little.  No fever.  +cough, chronic, takes Mucinex and nasal spray for this.    Assessment & Plan:   Principal Problem:   Aspiration pneumonia (HCC) Active Problems:   Aortic stenosis   Memory loss   Permanent atrial fibrillation (HCC)   Type 2 diabetes mellitus without complication (HCC)   Essential hypertension   Aspiration PNA -Given apparent episode of choking, advanced dementia, and infiltrate in left lower lobe on chest x-ray, most likely aspiration pneumonia.   [ ]  Speech therapy, appreciate recs - persistent aspiration yesterday with cough with eating with daughter, will f/u speech recommendations, MBS today -Given story, will narrow from vanc/zosyn to unasyn -> vantin/flagyl (of note, pt with charted pcn allergy - he received zosyn initially and has tolerated unasyn well.  Discussed this with pt daughter and discussed we would switch to different abx PO).  -Repeat CBC in am, WBC slightly higher, but likely due to steroids -Sputum cultures not collected -Blood cultures NGTD -Strep pneumo testing not collected -PCT 0.25  Aortic stenosis -Known moderate to severe disease -Has chosen no intervention  Dementia -Continue Aricept and Namenda -Family counseled about how patients with dementia often do better behaviorally while hospitalized if family  members are present overnight -He is on Xanax and Depakote, presumably for behavioral issues associated with dementia -Continue Melatonin  Afib -Rate controlled at time of presentation and throughout ER time -He is on Eliquis  DM -Diet controlled -Will follow with fasting AM labs -It is unlikely that he will need acute or chronic treatment for this issue  HTN -Continue Lopressor  CKD Stage III: Cr fluctuating, continue to monitor  DVT prophylaxis: eliquis Code Status: DNR Family Communication: daughter at bedside Disposition Plan: awaiting speech eval for possible MBS then likely d/c back to facility  Consultants:   none  Procedures: (Don't include imaging studies which can be auto populated. Include things that cannot be auto populated i.e. Echo, Carotid and venous dopplers, Foley, Bipap, HD, tubes/drains, wound vac, central lines etc)  none  Antimicrobials: (specify start and planned stop date. Auto populated tables are space occupying and do not give end dates) Anti-infectives (From admission, onward)   Start     Dose/Rate Route Frequency Ordered Stop   04/07/17 1000  cefpodoxime (VANTIN) tablet 200 mg     200 mg Oral Daily 04/06/17 1959 04/10/17 0959   04/06/17 2200  metroNIDAZOLE (FLAGYL) tablet 500 mg     500 mg Oral Every 8 hours 04/06/17 1808 04/09/17 2159   04/06/17 1815  cefpodoxime (VANTIN) tablet 200 mg  Status:  Discontinued     200 mg Oral Daily 04/06/17 1808 04/06/17 1959   04/05/17 1600  ceFEPIme (MAXIPIME) 1 g in dextrose 5 % 50 mL IVPB  Status:  Discontinued  1 g 100 mL/hr over 30 Minutes Intravenous Every 24 hours 04/04/17 1715 04/04/17 1915   04/05/17 1600  vancomycin (VANCOCIN) IVPB 1000 mg/200 mL premix  Status:  Discontinued     1,000 mg 200 mL/hr over 60 Minutes Intravenous Every 24 hours 04/04/17 1715 04/05/17 0844   04/05/17 1000  Ampicillin-Sulbactam (UNASYN) 3 g in sodium chloride 0.9 % 100 mL IVPB  Status:  Discontinued     3 g 200  mL/hr over 30 Minutes Intravenous Every 8 hours 04/05/17 0929 04/06/17 1556   04/04/17 1930  piperacillin-tazobactam (ZOSYN) IVPB 3.375 g  Status:  Discontinued     3.375 g 12.5 mL/hr over 240 Minutes Intravenous Every 8 hours 04/04/17 1929 04/05/17 0853   04/04/17 1600  ceFEPIme (MAXIPIME) 2 g in dextrose 5 % 50 mL IVPB     2 g 100 mL/hr over 30 Minutes Intravenous  Once 04/04/17 1550 04/04/17 1657   04/04/17 1600  vancomycin (VANCOCIN) IVPB 1000 mg/200 mL premix     1,000 mg 200 mL/hr over 60 Minutes Intravenous  Once 04/04/17 1550 04/04/17 1804      Subjective: A&Ox1. No complaints.  Objective: Vitals:   04/06/17 1057 04/06/17 1500 04/06/17 2222 04/07/17 0654  BP:  140/76 128/64 134/65  Pulse:  74 97   Resp:  18 20   Temp:  98.1 F (36.7 C) (!) 97.5 F (36.4 C) 98.5 F (36.9 C)  TempSrc:  Oral Oral   SpO2: 99% 98% 100% 100%  Weight:      Height:        Intake/Output Summary (Last 24 hours) at 04/07/2017 1049 Last data filed at 04/06/2017 1500 Gross per 24 hour  Intake 947.5 ml  Output -  Net 947.5 ml   Filed Weights   04/04/17 1500  Weight: 63.5 kg (140 lb)    Examination:  General: No acute distress. Cardiovascular: Heart sounds show a regular rate, and rhythm. No gallops or rubs. No murmurs. No JVD. Lungs: Clear to auscultation bilaterally with good air movement. No rales, rhonchi or wheezes. Abdomen: Soft, nontender, nondistended with normal active bowel sounds. No masses. No hepatosplenomegaly. Neurological: Alert and oriented 1. Moves all extremities 4 with equal strength. Cranial nerves II through XII grossly intact. Skin: Warm and dry. No rashes or lesions. Extremities: No clubbing or cyanosis. No edema.  Psychiatric: Mood and affect are normal. Insight and judgment are appropriate.  Data Reviewed: I have personally reviewed following labs and imaging studies  CBC: Recent Labs  Lab 04/04/17 1512 04/05/17 0709 04/06/17 0919 04/07/17 0453    WBC 11.2* 12.5* 16.2* 15.5*  NEUTROABS 5.8 11.8*  --   --   HGB 13.2 12.2* 11.7* 11.3*  HCT 41.7 36.8* 35.9* 35.0*  MCV 105.8* 102.2* 103.2* 103.6*  PLT 227 205 204 219   Basic Metabolic Panel: Recent Labs  Lab 04/04/17 1512 04/05/17 0709 04/06/17 0919 04/07/17 0453  NA 141 141 143 145  K 3.9 4.5 4.3 4.5  CL 103 106 108 110  CO2 25 23 26 26   GLUCOSE 191* 148* 147* 96  BUN 25* 25* 34* 35*  CREATININE 1.44* 1.31* 1.40* 1.14  CALCIUM 8.7* 8.3* 8.6* 8.5*   GFR: Estimated Creatinine Clearance: 37.9 mL/min (by C-G formula based on SCr of 1.14 mg/dL). Liver Function Tests: No results for input(s): AST, ALT, ALKPHOS, BILITOT, PROT, ALBUMIN in the last 168 hours. No results for input(s): LIPASE, AMYLASE in the last 168 hours. No results for input(s): AMMONIA in the last  168 hours. Coagulation Profile: No results for input(s): INR, PROTIME in the last 168 hours. Cardiac Enzymes: Recent Labs  Lab 04/04/17 1512  TROPONINI <0.03   BNP (last 3 results) No results for input(s): PROBNP in the last 8760 hours. HbA1C: No results for input(s): HGBA1C in the last 72 hours. CBG: Recent Labs  Lab 04/06/17 0740 04/06/17 1151 04/06/17 1634 04/06/17 2218 04/07/17 0856  GLUCAP 101* 112* 118* 172* 111*   Lipid Profile: No results for input(s): CHOL, HDL, LDLCALC, TRIG, CHOLHDL, LDLDIRECT in the last 72 hours. Thyroid Function Tests: No results for input(s): TSH, T4TOTAL, FREET4, T3FREE, THYROIDAB in the last 72 hours. Anemia Panel: No results for input(s): VITAMINB12, FOLATE, FERRITIN, TIBC, IRON, RETICCTPCT in the last 72 hours. Sepsis Labs: Recent Labs  Lab 04/04/17 1957  PROCALCITON 0.25    Recent Results (from the past 240 hour(s))  Culture, blood (routine x 2) Call MD if unable to obtain prior to antibiotics being given     Status: None (Preliminary result)   Collection Time: 04/04/17  7:51 PM  Result Value Ref Range Status   Specimen Description BLOOD RIGHT ARM  Final    Special Requests   Final    BOTTLES DRAWN AEROBIC ONLY Blood Culture results may not be optimal due to an inadequate volume of blood received in culture bottles   Culture NO GROWTH 3 DAYS  Final   Report Status PENDING  Incomplete  Culture, blood (routine x 2) Call MD if unable to obtain prior to antibiotics being given     Status: None (Preliminary result)   Collection Time: 04/04/17  7:57 PM  Result Value Ref Range Status   Specimen Description LEFT ANTECUBITAL  Final   Special Requests   Final    BOTTLES DRAWN AEROBIC AND ANAEROBIC Blood Culture adequate volume   Culture NO GROWTH 3 DAYS  Final   Report Status PENDING  Incomplete  MRSA PCR Screening     Status: None   Collection Time: 04/04/17  9:29 PM  Result Value Ref Range Status   MRSA by PCR NEGATIVE NEGATIVE Final    Comment:        The GeneXpert MRSA Assay (FDA approved for NASAL specimens only), is one component of a comprehensive MRSA colonization surveillance program. It is not intended to diagnose MRSA infection nor to guide or monitor treatment for MRSA infections.          Radiology Studies: No results found.      Scheduled Meds: . allopurinol  100 mg Oral Daily  . apixaban  2.5 mg Oral BID  . cefpodoxime  200 mg Oral Daily  . divalproex  125 mg Oral QHS  . donepezil  10 mg Oral QHS  . feeding supplement (ENSURE ENLIVE)  237 mL Oral BID  . Influenza vac split quadrivalent PF  0.5 mL Intramuscular Tomorrow-1000  . loratadine  10 mg Oral Daily  . mouth rinse  15 mL Mouth Rinse BID  . memantine  5 mg Oral BID  . metoprolol tartrate  37.5 mg Oral Daily  . metroNIDAZOLE  500 mg Oral Q8H   Continuous Infusions: . sodium chloride 75 mL/hr at 04/07/17 0744     LOS: 3 days    Time spent: over 20 minutes    Lacretia Nicksaldwell Powell, MD [facility] Pager [phone]  If 7PM-7AM, please contact night-coverage [email or web address] Password [email or web address] 04/07/2017, 10:49 AM

## 2017-04-07 NOTE — Consult Note (Signed)
Referring Provider: Grier Mitts. MD Gastroenterologist:  Dr. Karilyn Cota  Reason for Consultation:    Abnormal modified barium study suggesting distal esophageal stricture.  HPI:   Patient is 83 year old Caucasian male who was 5-year history of progressive dementia and has been cared for at [facility] dementia unit.  Patient was brought to emergency room 3 days ago with history of choking spell at lunch followed by emesis and brief syncopal episode.  Patient was suspected to have aspirated.  He was brought to emergency room by EMS.  Chest film revealed left lower lobe infiltrate consistent with aspiration pneumonia.  Patient was begun on IV antibiotics and has improved. Patient underwent modified barium study under supervision of Ms. Aaron [name] CCC/SLP earlier today.  He was noted to have oral phase as well as pharyngeal phase dysphagia with pooling in valleculae and the study also revealed distal esophageal narrowing preventing passage of barium pill.  According to patient's daughter Aaron [name] who is at bedside he has been having postprandial coughing spells for over a year.  However he has not been treated for bronchitis or pneumonia until now.  There is no history of GERD or esophageal stricture.  Patient is cared for at [facility] dementia unit.  He does not verbalize his needs.  However he would eat when served.  This year he had lost some weight.  He had been receiving supplement and he has started to gain weight.  There is no history of hematemesis melena or rectal bleeding.  He is prone to constipation.  He is able to walk some.  He spends most of his time in a chair and bed.  He is a retired [employer].  He is divorced.  He has 2 daughters. He quit cigarette smoking over 50 years ago and he does not drink alcohol.  He had 5 sisters and 1 brother and they are all disease.   Past Medical History:  Diagnosis Date  . Aortic stenosis    Probable moderate with discordant gradient (mild) and  area (severe); refused replacement  . Coronary atherosclerosis of native coronary artery    a. S/P CABG 1998,  b. PCI to CFX with BMS 2008 => ISR => s/p Cypher DES + POBA;  c. inf STEMI 8/14: LHC 11/12/12:  LAD occluded at the origin, mid CFX stent patent, proximal PDA 60-70%, LIMA-LAD patent. Unclear culprit for his inferior STEMI => med Rx (? embolic from AFib)  . Dementia   . Diabetes mellitus without complication (HCC)   . Essential hypertension, benign   . Gout   . Hyperlipidemia   . Permanent atrial fibrillation (HCC)    on Eliquis  . STEMI (ST elevation myocardial infarction) [facility])     Past Surgical History:  Procedure Laterality Date  . AORTOGRAM  11/12/2012   Procedure: AORTOGRAM;  Surgeon: Runell Gess, MD;  Location: [facility] CATH LAB;  Service: Cardiovascular;;  . CARDIAC CATHETERIZATION  11/12/2012   Occluded ostial LAD, patent LIMA-LAD, patent mid LCx stent, 60-70% prox PDA stenosis, mild ascending aortic dilatation w/o dissection or AV insufficiency; unclear culprit lesion- ? cardioembolic vs plaque lysis  . CORONARY ARTERY BYPASS GRAFT  09/07/1996   LIMA TO LAD - minimally invasive off-pump procedure  . HERNIA REPAIR    . INGUINAL HERNIA REPAIR      Prior to Admission medications   Medication Sig Start Date End Date Taking? Authorizing Provider  allopurinol (ZYLOPRIM) 100 MG tablet Take 100 mg by mouth daily.   Yes [provider]  ALPRAZolam (XANAX) 0.5 MG tablet Take 0.25 mg by mouth 3 (three) times daily as needed for anxiety.   Yes [provider]  alum & mag hydroxide-simeth (MINTOX) 200-200-20 MG/5ML suspension Take 10 mLs by mouth 3 (three) times daily as needed for indigestion or heartburn.   Yes [provider]  apixaban (ELIQUIS) 2.5 MG TABS tablet Take 2.5 mg by mouth 2 (two) times daily. For afib   Yes [provider]  cetirizine (ZYRTEC) 10 MG tablet Take 10 mg by mouth daily.   Yes [provider]  divalproex (DEPAKOTE)  125 MG DR tablet Take 125 mg by mouth at bedtime.   Yes [provider]  donepezil (ARICEPT) 10 MG tablet Take 10 mg by mouth at bedtime.   Yes [provider]  guaifenesin (ROBITUSSIN) 100 MG/5ML syrup Take 200 mg by mouth every 6 (six) hours as needed for cough.   Yes [provider]  ipratropium (ATROVENT) 0.06 % nasal spray Place 2 sprays into both nostrils 2 (two) times daily as needed for rhinitis.   Yes [provider]  lidocaine (ASPERCREME W/LIDOCAINE) 4 % cream Apply 1 application topically 3 (three) times daily as needed (back pain).   Yes [provider]  Melatonin 3 MG TABS Take 1 tablet by mouth at bedtime.   Yes [provider]  memantine (NAMENDA) 5 MG tablet Take 5 mg by mouth 2 (two) times daily.   Yes [provider]  metoprolol tartrate (LOPRESSOR) 25 MG tablet Take 37.5 mg by mouth daily.   Yes [provider]  mineral oil liquid Take by mouth once a week. *Instill one drop into each ear once weekly   Yes [provider]  nitroGLYCERIN (NITROSTAT) 0.4 MG SL tablet Place 1 tablet (0.4 mg total) under the tongue every 5 (five) minutes as needed for chest pain. 11/21/12  Yes Arguello, Roger A, PA-C  Nutritional Supplements (ENSURE HIGH PROTEIN) LIQD Take 1 Can by mouth 2 (two) times daily.   Yes [provider]  Polyvinyl Alcohol-Povidone (REFRESH OP) Place 1 drop into the left eye 2 (two) times daily as needed (dry or itching eyes.).    Yes [provider]    Current Facility-Administered Medications  Medication Dose Route Frequency Provider Last Rate Last Dose  . 0.9 %  sodium chloride infusion   Intravenous Continuous Jonah BlueYates, Jennifer, MD 75 mL/hr at 04/07/17 0744    . allopurinol (ZYLOPRIM) tablet 100 mg  100 mg Oral Daily Jonah BlueYates, Jennifer, MD   100 mg at 04/07/17 1020  . ALPRAZolam Aaron [name](XANAX) tablet 0.25 mg  0.25 mg Oral TID PRN Jonah BlueYates, Jennifer, MD   0.25 mg at 04/06/17 2005  .  cefpodoxime (VANTIN) tablet 200 mg  200 mg Oral Daily Kirby-Graham, Beather ArbourKaren J, NP   200 mg at 04/07/17 1026  . divalproex (DEPAKOTE) DR tablet 125 mg  125 mg Oral Noemi ChapelQHS Yates, Jennifer, MD   125 mg at 04/05/17 2137  . donepezil (ARICEPT) tablet 10 mg  10 mg Oral Noemi ChapelQHS Yates, Jennifer, MD   10 mg at 04/05/17 2137  . feeding supplement (ENSURE ENLIVE) (ENSURE ENLIVE) liquid 237 mL  237 mL Oral BID Jonah BlueYates, Jennifer, MD   237 mL at 04/07/17 1027  . haloperidol lactate (HALDOL) injection 1 mg  1 mg Intravenous Q6H PRN Zigmund DanielPowell, A Caldwell Jr., MD      . Influenza vac split quadrivalent PF (FLUZONE HIGH-DOSE) injection 0.5 mL  0.5 mL Intramuscular Tomorrow-1000 Lowell GuitarPowell, A  Charlyn Minerva., MD      . ipratropium (ATROVENT) 0.03 % nasal spray 2 spray  2 spray Each Nare BID PRN Jonah Blue, MD      . loratadine (CLARITIN) tablet 10 mg  10 mg Oral Daily Jonah Blue, MD   10 mg at 04/07/17 1020  . MEDLINE mouth rinse  15 mL Mouth Rinse BID Zigmund Daniel., MD   15 mL at 04/05/17 2138  . memantine (NAMENDA) tablet 5 mg  5 mg Oral BID Jonah Blue, MD   5 mg at 04/07/17 1019  . metoprolol tartrate (LOPRESSOR) tablet 37.5 mg  37.5 mg Oral Daily Jonah Blue, MD   37.5 mg at 04/07/17 1019  . metroNIDAZOLE (FLAGYL) tablet 500 mg  500 mg Oral Q8H Zigmund Daniel., MD   500 mg at 04/07/17 1515  . RESOURCE THICKENUP CLEAR   Oral PRN Zigmund Daniel., MD        Allergies as of 04/04/2017 - Review Complete 04/04/2017  Allergen Reaction Noted  . Penicillins Other (See Comments) 09/11/2008    Family History  Problem Relation Age of Onset  . Stroke Mother   . Stroke Father   . Other Brother 72       MI    Social History   Socioeconomic History  . Marital status: Divorced    Spouse name: Not on file  . Number of children: 2  . Years of education: Not on file  . Highest education level: Not on file  Social Needs  . Financial resource strain: Not on file  . Food insecurity - worry: Not  on file  . Food insecurity - inability: Not on file  . Transportation needs - medical: Not on file  . Transportation needs - non-medical: Not on file  Occupational History    Employer: RETIRED  Tobacco Use  . Smoking status: Former Smoker    Types: Cigarettes    Last attempt to quit: 04/19/1948    Years since quitting: 69.0  . Smokeless tobacco: Never Used  . Tobacco comment: smoked from age [age] and then quit  Substance and Sexual Activity  . Alcohol use: No    Alcohol/week: 0.0 oz  . Drug use: No  . Sexual activity: Not on file  Other Topics Concern  . Not on file  Social History Narrative   Lives in [place]  alone. 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    Review of Systems: See HPI, otherwise normal ROS  Physical Exam: Temp:  [97.5 F (36.4 C)-98.5 F (36.9 C)] 98.5 F (36.9 C) (12/26 0654) Pulse Rate:  [97] 97 (12/25 2222) Resp:  [20] 20 (12/25 2222) BP: (128-134)/(64-65) 134/65 (12/26 0654) SpO2:  [100 %] 100 % (12/26 0654) Last BM Date: 04/05/17  Thin Caucasian male who is alert and in no acute distress. He responds appropriately to simple questions. Conjunctiva is pink.  Sclerae nonicteric. Oropharyngeal mucosa is somewhat dry.  He has few remaining teeth and lower jaw. No neck masses or thyromegaly noted.  Cardiac exam with regular rhythm normal S1 and S2.  Grade 2/6 systolic ejection murmur noted at aortic area. Lungs are clear to auscultation. Abdomen is flat.  Bowel sounds are normal.  On palpation it is soft and nontender without organomegaly or masses. No peripheral edema or clubbing noted.    Lab Results: Recent Labs    04/05/17 0709 04/06/17 0919 04/07/17 0453  WBC 12.5* 16.2* 15.5*  HGB 12.2*  11.7* 11.3*  HCT 36.8* 35.9* 35.0*  PLT 205 204 219   BMET Recent Labs    04/05/17 0709 04/06/17 0919 04/07/17 0453  NA 141 143 145  K 4.5 4.3 4.5  CL 106 108 110  CO2 23 26 26   GLUCOSE 148* 147* 96  BUN 25* 34* 35*   CREATININE 1.31* 1.40* 1.14  CALCIUM 8.3* 8.6* 8.5*     Assessment;  Patient is an elderly Caucasian male with history of dementia who was hospitalized for aspiration pneumonia and is responding to antibiotic. Patient was evaluated by Ms. Aaron Morosabney Porter, CCC-SLP.  He underwent modified barium study.  He was felt to have not only oral phase as well as pharyngeal phase dysphagia but also felt to have esophageal dysphagia as evidenced by obstruction of barium pill passage into stomach.  Barium pill got large at distal esophagus and did not pass distally.  There appears to be narrowing to distal esophagus. Differential diagnosis includes esophageal motility disorder versus distal esophageal stricture.  Patient is at risk for gastroesophageal reflux disease because of very sedentary lifestyle. He is therefore at increased risk for recurrent aspiration and would benefit from endoscopic evaluation which would also provide opportunity to dilate his esophagus if indicated. Patient is on anticoagulant.  Last dose was on 04/06/2017 therefore will have to wait another day.  Before EGD could be scheduled.   Recommendations;  Hold Eliquis for the procedure. Esophagogastroduodenoscopy and possible esophageal dilation on 04/09/2017. I have reviewed procedure and risks with patient's daughter Aaron LundJanet who is at bedside and she is agreeable. Further recommendations to follow.   LOS: 3 days   Aaron [name]  04/07/2017, 5:03 PM

## 2017-04-07 NOTE — Evaluation (Signed)
Physical Therapy Evaluation Patient Details Name: Aaron [name] MRN: [id number] DOB: 10/20/1923 Today's Date: 04/07/2017   History of Present Illness  Aaron [name] is a 83 y.o. male with medical history significant of CAD; afib; HLD; HTN; DM; and dementia presenting after an apparent aspiration episode.  He was at the facility and appeared to get choked at lunchtime and vomited.  He seemed to be okay and was walking to his room and vomited again.  At that time they called 911 to send him in.  He vomited more en route.  No known SOB.  He has not had known trouble swallowing in the past, but they did modify his diet in case. He eats very little.  No fever.  +cough, chronic, takes Mucinex and nasal spray for this.      Clinical Impression  Patient limited for functional mobility as stated below secondary to BLE weakness, fatigue and poor standing balance.  Patient will benefit from continued physical therapy in hospital and recommended venue below to increase strength, balance, endurance for safe ADLs and gait.    Follow Up Recommendations SNF;Supervision/Assistance - 24 hour    Equipment Recommendations  None recommended by PT    Recommendations for Other Services       Precautions / Restrictions Precautions Precautions: Fall Restrictions Weight Bearing Restrictions: No      Mobility  Bed Mobility Overal bed mobility: Needs Assistance Bed Mobility: Supine to Sit;Sit to Supine     Supine to sit: Min assist Sit to supine: Min assist   General bed mobility comments: slow labored movement  Transfers Overall transfer level: Needs assistance Equipment used: Rolling walker (2 wheeled) Transfers: Sit to/from Stand Sit to Stand: Min assist            Ambulation/Gait Ambulation/Gait assistance: Min assist Ambulation Distance (Feet): 45 Feet Assistive device: Rolling walker (2 wheeled) Gait Pattern/deviations: Decreased step length - right;Decreased step length -  left;Decreased stride length   Gait velocity interpretation: Below normal speed for age/gender General Gait Details: demonstrates slow slightly labored unsteady gait without loss of balance, have to make wide turns when changing directions, limited secondary to fatgiue  Stairs            Wheelchair Mobility    Modified Rankin (Stroke Patients Only)       Balance Overall balance assessment: Needs assistance Sitting-balance support: No upper extremity supported;Feet supported Sitting balance-Leahy Scale: Good     Standing balance support: Bilateral upper extremity supported;During functional activity Standing balance-Leahy Scale: Fair                               Pertinent Vitals/Pain Pain Assessment: No/denies pain    Home Living Family/patient expects to be discharged to:: Assisted living               Home Equipment: Walker - 4 wheels      Prior Function Level of Independence: Needs assistance   Gait / Transfers Assistance Needed: Supervised household gait with Rollator, sometimes will walk without assistive device when he forgets to use Rollator  ADL's / Homemaking Assistance Needed: assisted by ALF staff        Hand Dominance        Extremity/Trunk Assessment   Upper Extremity Assessment Upper Extremity Assessment: Generalized weakness    Lower Extremity Assessment Lower Extremity Assessment: Generalized weakness    Cervical / Trunk Assessment Cervical / Trunk Assessment: Kyphotic  Communication   Communication: No difficulties  Cognition Arousal/Alertness: Awake/alert Behavior During Therapy: WFL for tasks assessed/performed Overall Cognitive Status: Within Functional Limits for tasks assessed                                        General Comments      Exercises     Assessment/Plan    PT Assessment Patient needs continued PT services  PT Problem List Decreased strength;Decreased activity  tolerance;Decreased balance;Decreased mobility       PT Treatment Interventions Gait training;Functional mobility training;Therapeutic activities;Therapeutic exercise;Patient/family education    PT Goals (Current goals can be found in the Care Plan section)  Acute Rehab PT Goals Patient Stated Goal: return home PT Goal Formulation: With patient/family Time For Goal Achievement: 04/10/17 Potential to Achieve Goals: Good    Frequency Min 3X/week   Barriers to discharge        Co-evaluation               AM-PAC PT "6 Clicks" Daily Activity  Outcome Measure Difficulty turning over in bed (including adjusting bedclothes, sheets and blankets)?: A Little Difficulty moving from lying on back to sitting on the side of the bed? : A Little Difficulty sitting down on and standing up from a chair with arms (e.g., wheelchair, bedside commode, etc,.)?: A Little Help needed moving to and from a bed to chair (including a wheelchair)?: A Little Help needed walking in hospital room?: A Little Help needed climbing 3-5 steps with a railing? : A Lot 6 Click Score: 17    End of Session Equipment Utilized During Treatment: Gait belt Activity Tolerance: Patient tolerated treatment well;Patient limited by fatigue Patient left: in bed;with call bell/phone within reach;with family/visitor present Nurse Communication: Mobility status PT Visit Diagnosis: Unsteadiness on feet (R26.81);Other abnormalities of gait and mobility (R26.89);Muscle weakness (generalized) (M62.81)    Time: [id number] PT Time Calculation (min) (ACUTE ONLY): 26 min   Charges:   PT Evaluation $PT Eval Moderate Complexity: 1 Mod PT Treatments $Therapeutic Activity: 23-37 mins   PT G Codes:        1:47 PM, 04/07/17 Ocie BobJames Demiya Magno, MPT [facility] 336 [phone] office [phone] mobile phone

## 2017-04-08 DIAGNOSIS — K269 Duodenal ulcer, unspecified as acute or chronic, without hemorrhage or perforation: Secondary | ICD-10-CM

## 2017-04-08 DIAGNOSIS — K222 Esophageal obstruction: Secondary | ICD-10-CM

## 2017-04-08 DIAGNOSIS — K449 Diaphragmatic hernia without obstruction or gangrene: Secondary | ICD-10-CM

## 2017-04-08 LAB — BASIC METABOLIC PANEL
ANION GAP: 11 (ref 5–15)
BUN: 30 mg/dL — ABNORMAL HIGH (ref 6–20)
CALCIUM: 8.5 mg/dL — AB (ref 8.9–10.3)
CO2: 29 mmol/L (ref 22–32)
Chloride: 103 mmol/L (ref 101–111)
Creatinine, Ser: 1.05 mg/dL (ref 0.61–1.24)
GFR, EST NON AFRICAN AMERICAN: 60 mL/min — AB (ref >60)
GLUCOSE: 94 mg/dL (ref 65–99)
POTASSIUM: 3.6 mmol/L (ref 3.5–5.1)
Sodium: 143 mmol/L (ref 135–145)

## 2017-04-08 LAB — GLUCOSE, CAPILLARY
GLUCOSE-CAPILLARY: 78 mg/dL (ref 65–99)
Glucose-Capillary: 93 mg/dL (ref 65–99)
Glucose-Capillary: 91 mg/dL (ref 65–99)
Glucose-Capillary: 77 mg/dL (ref 65–99)

## 2017-04-08 LAB — CBC
HCT: 36.4 % — ABNORMAL LOW (ref 39.0–52.0)
Hemoglobin: 11.6 g/dL — ABNORMAL LOW (ref 13.0–17.0)
MCH: 33 pg (ref 26.0–34.0)
MCHC: 31.9 g/dL (ref 30.0–36.0)
MCV: 103.4 fL — AB (ref 78.0–100.0)
PLATELETS: 216 10*3/uL (ref 150–400)
RBC: 3.52 MIL/uL — AB (ref 4.22–5.81)
RDW: 13.7 % (ref 11.5–15.5)
WBC: 13.9 10*3/uL — AB (ref 4.0–10.5)

## 2017-04-08 NOTE — Progress Notes (Signed)
  Subjective:  Patient has no complaints. According to nursing staff he has not been coughing much today.  He has no difficulty swallowing pills but did not eat much.   Objective: Blood pressure (!) 179/108, pulse (!) 103, temperature 98.3 F (36.8 C), resp. rate 16, height 5\' 8"  (1.727 m), weight 140 lb (63.5 kg), SpO2 94 %. Patient is alert but he is confused.   He took off his gown.   Labs/studies Results:  Recent Labs    04/06/17 0919 04/07/17 0453 04/08/17 0500  WBC 16.2* 15.5* 13.9*  HGB 11.7* 11.3* 11.6*  HCT 35.9* 35.0* 36.4*  PLT 204 219 216    BMET  Recent Labs    04/06/17 0919 04/07/17 0453 04/08/17 0500  NA 143 145 143  K 4.3 4.5 3.6  CL 108 110 103  CO2 26 26 29   GLUCOSE 147* 96 94  BUN 34* 35* 30*  CREATININE 1.40* 1.14 1.05  CALCIUM 8.6* 8.5* 8.5*     Assessment:  #1.  Esophageal dysphagia.  While patient has oral and pharyngeal phase dysphagia but he also has what appears to be distal esophageal stricture preventing passage of barium pill.  Esophageal motility disorder versus esophageal stricture.  Patient's daughter Marylu LundJanet wants this to be further evaluated and esophagus dilated if indicated.  #2.  Aspiration pneumonia.  Patient is not having any respiratory symptoms.  He is now on oral antibiotics.  #3.  History of atrial fibrillation.  Anticoagulant on hold for procedure.  #4.  Dementia.   Recommendations:  Esophagogastroduodenoscopy with esophageal dilation if indicated on 04/09/2017.

## 2017-04-08 NOTE — Progress Notes (Signed)
Physical Therapy Treatment Patient Details Name: Aaron GratesLetcher E Mauriello MRN: [id number] DOB: 06/07/1923 Today's Date: 04/08/2017    History of Present Illness Aaron [name] is a 83 y.o. male with medical history significant of CAD; afib; HLD; HTN; DM; and dementia presenting after an apparent aspiration episode.  He was at the facility and appeared to get choked at lunchtime and vomited.  He seemed to be okay and was walking to his room and vomited again.  At that time they called 911 to send him in.  He vomited more en route.  No known SOB.  He has not had known trouble swallowing in the past, but they did modify his diet in case. He eats very little.  No fever.  +cough, chronic, takes Mucinex and nasal spray for this.      PT Comments    Patient demonstrates increased endurance for gait training, no loss of balance, but frequent veering to the right requiring assistance to avoid hitting walls, door jams and patient requested to continue sitting up in chair after therapy.  Patient will benefit from continued physical therapy in hospital and recommended venue below to increase strength, balance, endurance for safe ADLs and gait.    Follow Up Recommendations  Home health PT;Supervision/Assistance - 24 hour     Equipment Recommendations  None recommended by PT    Recommendations for Other Services       Precautions / Restrictions Precautions Precautions: Fall Restrictions Weight Bearing Restrictions: No    Mobility  Bed Mobility               General bed mobility comments: presents up in chair (assisted by nursing staff)  Transfers Overall transfer level: Needs assistance Equipment used: Rolling walker (2 wheeled) Transfers: Sit to/from Stand Sit to Stand: Min guard         General transfer comment: slightly labored movement, verbal cues for safety  Ambulation/Gait Ambulation/Gait assistance: Min assist Ambulation Distance (Feet): 75 Feet Assistive device:  Rolling walker (2 wheeled) Gait Pattern/deviations: Decreased step length - right;Decreased step length - left;Decreased stride length   Gait velocity interpretation: Below normal speed for age/gender General Gait Details: requires verbal cues to step closer to RW with fair carryover, demonstrates increased endurance/distance for gait, occasional veering to the right, required assistance to navigate past doors/furniture   Stairs            Wheelchair Mobility    Modified Rankin (Stroke Patients Only)       Balance Overall balance assessment: Needs assistance Sitting-balance support: No upper extremity supported;Feet supported Sitting balance-Leahy Scale: Good     Standing balance support: Bilateral upper extremity supported;During functional activity Standing balance-Leahy Scale: Fair                              Cognition Arousal/Alertness: Awake/alert Behavior During Therapy: WFL for tasks assessed/performed Overall Cognitive Status: Within Functional Limits for tasks assessed                                        Exercises General Exercises - Lower Extremity Long Arc Quad: Seated;AROM;Strengthening;Both;10 reps Hip Flexion/Marching: Seated;AROM;Strengthening;Both;10 reps Toe Raises: Seated;AROM;Strengthening;Both;10 reps Heel Raises: Seated;AROM;Strengthening;Both;10 reps    General Comments        Pertinent Vitals/Pain Pain Assessment: 0-10 Pain Score: 7  Pain Location: bottom Pain Descriptors / Indicators:  Aching Pain Intervention(s): Limited activity within patient's tolerance;Monitored during session    Home Living                      Prior Function            PT Goals (current goals can now be found in the care plan section) Acute Rehab PT Goals Patient Stated Goal: return home PT Goal Formulation: With patient/family Time For Goal Achievement: 04/10/17 Potential to Achieve Goals: Good Progress towards  PT goals: Progressing toward goals    Frequency    Min 3X/week      PT Plan Current plan remains appropriate    Co-evaluation              AM-PAC PT "6 Clicks" Daily Activity  Outcome Measure  Difficulty turning over in bed (including adjusting bedclothes, sheets and blankets)?: A Little Difficulty moving from lying on back to sitting on the side of the bed? : A Little Difficulty sitting down on and standing up from a chair with arms (e.g., wheelchair, bedside commode, etc,.)?: A Little Help needed moving to and from a bed to chair (including a wheelchair)?: A Little Help needed walking in hospital room?: A Little Help needed climbing 3-5 steps with a railing? : A Lot 6 Click Score: 17    End of Session Equipment Utilized During Treatment: Gait belt Activity Tolerance: Patient tolerated treatment well;Patient limited by fatigue Patient left: in chair;with call bell/phone within reach;with chair alarm set Nurse Communication: Mobility status PT Visit Diagnosis: Unsteadiness on feet (R26.81);Other abnormalities of gait and mobility (R26.89);Muscle weakness (generalized) (M62.81)     Time: [id number] PT Time Calculation (min) (ACUTE ONLY): 29 min  Charges:  $Gait Training: 8-22 mins $Therapeutic Exercise: 8-22 mins                    G Codes:       11:20 AM, 04/08/17 Ocie BobJames Walfred Bettendorf, MPT Physical Therapist with [facility] 336 [phone] office [phone] mobile phone

## 2017-04-08 NOTE — Progress Notes (Signed)
PROGRESS NOTE    RENJI Aaron [name]  ZOX:[id number] DOB: 1923/04/23 DOA: 04/04/2017 PCP: System, Provider Not In  Brief Narrative:  Aaron [name] is Aaron [name] 84 y.o. male with medical history significant of CAD; afib; HLD; HTN; DM; and dementia presenting after an apparent aspiration episode.  He was at the facility and appeared to get choked at lunchtime and vomited.  He seemed to be okay and was walking to his room and vomited again.  At that time they called 911 to send him in.  He vomited more en route.  No known SOB.  He has not had known trouble swallowing in the past, but they did modify his diet in case. He eats very little.  No fever.  +cough, chronic, takes Mucinex and nasal spray for this.    Assessment & Plan:   Principal Problem:   Aspiration pneumonia (HCC) Active Problems:   Aortic stenosis   Memory loss   Permanent atrial fibrillation (HCC)   Type 2 diabetes mellitus without complication (HCC)   Essential hypertension   Aspiration PNA -Given apparent episode of choking, advanced dementia, and infiltrate in left lower lobe on chest x-ray, most likely aspiration pneumonia.   [ ]  Speech therapy, appreciate recs - MBS with notable narrowing at GE junction.  Rec GI c/s and D1/puree and nectar thick liquids.  Rec SLP follow up at [facility].  [ ]  GI c/s, appreciate recs, plan for endoscopy tomorrow with dilation, holding eliquis -Given story, will narrow from vanc/zosyn to unasyn -> vantin/flagyl (of note, pt with charted pcn allergy - he received zosyn initially and has tolerated unasyn well.  Discussed this with pt daughter and discussed we would switch to different abx PO).  - WBC improving, s/p steroids -Sputum cultures not collected -Blood cultures NGTD -Strep pneumo testing not collected -PCT 0.25  SLP recommendations:  Dysphagia 1 solids (puree), nectar thick liquid Liquid Administration via: Cup;No straw Medication Administration: Crushed with puree Recommended  Consults: Consider GI evaluation Supervision: Patient able to self feed;Full supervision/cueing for compensatory strategies Compensations: Minimize environmental distractions;Slow rate;Small sips/bites  Postural Changes: Remain semi-upright after after feeds/meals (Comment);Seated upright at 90 degrees Oral Care Recommendations: Oral care BID;Staff/trained caregiver to provide oral care Other Recommendations: Order thickener from pharmacy;Clarify dietary restrictions;Remove water pitcher;Prohibited food (jello, ice cream, thin soups)   Aortic stenosis -Known moderate to severe disease -Has chosen no intervention  Dementia -Continue Aricept and Namenda -Family counseled about how patients with dementia often do better behaviorally while hospitalized if family members are present overnight -He is on Xanax and Depakote, presumably for behavioral issues associated with dementia -Continue Melatonin  Afib -Rate controlled at time of presentation and throughout ER time -He is on Eliquis, held for procedure tomorrow  DM -Diet controlled -Will follow with fasting AM labs -It is unlikely that he will need acute or chronic treatment for this issue  HTN -Continue Lopressor  CKD Stage III: Cr, but improved from baseline now, continue to monitor  DVT prophylaxis: eliquis Code Status: DNR Family Communication: daughter at bedside Disposition Plan: awaiting endoscopy  Consultants:   none  Procedures: (Don't include imaging studies which can be auto populated. Include things that cannot be auto populated i.e. Echo, Carotid and venous dopplers, Foley, Bipap, HD, tubes/drains, wound vac, central lines etc)  none  Antimicrobials: (specify start and planned stop date. Auto populated tables are space occupying and do not give end dates) Anti-infectives (From admission, onward)   Start     Dose/Rate Route  Frequency Ordered Stop   04/07/17 1000  cefpodoxime (VANTIN) tablet 200 mg     200  mg Oral Daily 04/06/17 1959 04/10/17 0959   04/06/17 2200  metroNIDAZOLE (FLAGYL) tablet 500 mg     500 mg Oral Every 8 hours 04/06/17 1808 04/09/17 2159   04/06/17 1815  cefpodoxime (VANTIN) tablet 200 mg  Status:  Discontinued     200 mg Oral Daily 04/06/17 1808 04/06/17 1959   04/05/17 1600  ceFEPIme (MAXIPIME) 1 g in dextrose 5 % 50 mL IVPB  Status:  Discontinued     1 g 100 mL/hr over 30 Minutes Intravenous Every 24 hours 04/04/17 1715 04/04/17 1915   04/05/17 1600  vancomycin (VANCOCIN) IVPB 1000 mg/200 mL premix  Status:  Discontinued     1,000 mg 200 mL/hr over 60 Minutes Intravenous Every 24 hours 04/04/17 1715 04/05/17 0844   04/05/17 1000  Ampicillin-Sulbactam (UNASYN) 3 g in sodium chloride 0.9 % 100 mL IVPB  Status:  Discontinued     3 g 200 mL/hr over 30 Minutes Intravenous Every 8 hours 04/05/17 0929 04/06/17 1556   04/04/17 1930  piperacillin-tazobactam (ZOSYN) IVPB 3.375 g  Status:  Discontinued     3.375 g 12.5 mL/hr over 240 Minutes Intravenous Every 8 hours 04/04/17 1929 04/05/17 0853   04/04/17 1600  ceFEPIme (MAXIPIME) 2 g in dextrose 5 % 50 mL IVPB     2 g 100 mL/hr over 30 Minutes Intravenous  Once 04/04/17 1550 04/04/17 1657   04/04/17 1600  vancomycin (VANCOCIN) IVPB 1000 mg/200 mL premix     1,000 mg 200 mL/hr over 60 Minutes Intravenous  Once 04/04/17 1550 04/04/17 1804      Subjective: Aaron [name]&Ox1. No complaints.  Objective: Vitals:   04/07/17 0654 04/07/17 2128 04/08/17 0513 04/08/17 1414  BP: 134/65 (!) 169/97 (!) 183/94 (!) 179/108  Pulse:  88 72 (!) 103  Resp:  15  16  Temp: 98.5 F (36.9 C) 97.6 F (36.4 C) (!) 97.5 F (36.4 C) 98.3 F (36.8 C)  TempSrc:  Oral    SpO2: 100% 93% 97% 94%  Weight:      Height:        Intake/Output Summary (Last 24 hours) at 04/08/2017 1515 Last data filed at 04/08/2017 1237 Gross per 24 hour  Intake 600 ml  Output -  Net 600 ml   Filed Weights   04/04/17 1500  Weight: 63.5 kg (140 lb)     Examination:  General: No acute distress. Cardiovascular: Heart sounds show Aaron [name] regular rate, and rhythm. No gallops or rubs. No murmurs. No JVD. Lungs: Clear to auscultation bilaterally with good air movement. No rales, rhonchi or wheezes. Abdomen: Soft, nontender, nondistended with normal active bowel sounds. No masses. No hepatosplenomegaly. Neurological: Alert, disoriented.  Kenichi Cassada&Ox1. Moves all extremities 4. Cranial nerves II through XII grossly intact. Skin: Warm and dry. No rashes or lesions. Extremities: No clubbing or cyanosis. No edema.  Psychiatric: Mood and affect are normal. Insight and judgment are impaired.  Data Reviewed: I have personally reviewed following labs and imaging studies  CBC: Recent Labs  Lab 04/04/17 1512 04/05/17 0709 04/06/17 0919 04/07/17 0453 04/08/17 0500  WBC 11.2* 12.5* 16.2* 15.5* 13.9*  NEUTROABS 5.8 11.8*  --   --   --   HGB 13.2 12.2* 11.7* 11.3* 11.6*  HCT 41.7 36.8* 35.9* 35.0* 36.4*  MCV 105.8* 102.2* 103.2* 103.6* 103.4*  PLT 227 205 204 219 216   Basic Metabolic Panel: Recent Labs  Lab 04/04/17 1512 04/05/17 0709 04/06/17 0919 04/07/17 0453 04/08/17 0500  NA 141 141 143 145 143  K 3.9 4.5 4.3 4.5 3.6  CL 103 106 108 110 103  CO2 25 23 26 26 29   GLUCOSE 191* 148* 147* 96 94  BUN 25* 25* 34* 35* 30*  CREATININE 1.44* 1.31* 1.40* 1.14 1.05  CALCIUM 8.7* 8.3* 8.6* 8.5* 8.5*   GFR: Estimated Creatinine Clearance: 41.2 mL/min (by C-G formula based on SCr of 1.05 mg/dL). Liver Function Tests: No results for input(s): AST, ALT, ALKPHOS, BILITOT, PROT, ALBUMIN in the last 168 hours. No results for input(s): LIPASE, AMYLASE in the last 168 hours. No results for input(s): AMMONIA in the last 168 hours. Coagulation Profile: No results for input(s): INR, PROTIME in the last 168 hours. Cardiac Enzymes: Recent Labs  Lab 04/04/17 1512  TROPONINI <0.03   BNP (last 3 results) No results for input(s): PROBNP in the last 8760  hours. HbA1C: No results for input(s): HGBA1C in the last 72 hours. CBG: Recent Labs  Lab 04/07/17 1129 04/07/17 1614 04/07/17 2120 04/08/17 0757 04/08/17 1141  GLUCAP 103* 152* 102* 78 93   Lipid Profile: No results for input(s): CHOL, HDL, LDLCALC, TRIG, CHOLHDL, LDLDIRECT in the last 72 hours. Thyroid Function Tests: No results for input(s): TSH, T4TOTAL, FREET4, T3FREE, THYROIDAB in the last 72 hours. Anemia Panel: No results for input(s): VITAMINB12, FOLATE, FERRITIN, TIBC, IRON, RETICCTPCT in the last 72 hours. Sepsis Labs: Recent Labs  Lab 04/04/17 1957  PROCALCITON 0.25    Recent Results (from the past 240 hour(s))  Culture, blood (routine x 2) Call MD if unable to obtain prior to antibiotics being given     Status: None (Preliminary result)   Collection Time: 04/04/17  7:51 PM  Result Value Ref Range Status   Specimen Description BLOOD RIGHT ARM  Final   Special Requests   Final    BOTTLES DRAWN AEROBIC ONLY Blood Culture results may not be optimal due to an inadequate volume of blood received in culture bottles   Culture NO GROWTH 4 DAYS  Final   Report Status PENDING  Incomplete  Culture, blood (routine x 2) Call MD if unable to obtain prior to antibiotics being given     Status: None (Preliminary result)   Collection Time: 04/04/17  7:57 PM  Result Value Ref Range Status   Specimen Description LEFT ANTECUBITAL  Final   Special Requests   Final    BOTTLES DRAWN AEROBIC AND ANAEROBIC Blood Culture adequate volume   Culture NO GROWTH 4 DAYS  Final   Report Status PENDING  Incomplete  MRSA PCR Screening     Status: None   Collection Time: 04/04/17  9:29 PM  Result Value Ref Range Status   MRSA by PCR NEGATIVE NEGATIVE Final    Comment:        The GeneXpert MRSA Assay (FDA approved for NASAL specimens only), is one component of Vivienne Sangiovanni comprehensive MRSA colonization surveillance program. It is not intended to diagnose MRSA infection nor to guide or monitor  treatment for MRSA infections.          Radiology Studies: Dg Swallowing Func-speech Pathology  Result Date: 04/07/2017 Objective Swallowing Evaluation: Type of Study: MBS-Modified Barium Swallow Study  Patient Details Name: Aaron [name] MRN: [id number] Date of Birth: 10/21/1923 Today's Date: 04/07/2017 Time: SLP Start Time (ACUTE ONLY): 1400 -SLP Stop Time (ACUTE ONLY): 1440 SLP Time Calculation (min) (ACUTE ONLY): 40 min Past Medical History: Past  Medical History: Diagnosis Date . Aortic stenosis   Probable moderate with discordant gradient (mild) and area (severe); refused replacement . Coronary atherosclerosis of native coronary artery   Estrella Alcaraz. S/P CABG 1998,  b. PCI to CFX with BMS 2008 => ISR => s/p Cypher DES + POBA;  c. inf STEMI 8/14: LHC 11/12/12:  LAD occluded at the origin, mid CFX stent patent, proximal PDA 60-70%, LIMA-LAD patent. Unclear culprit for his inferior STEMI => med Rx (? embolic from AFib) . Dementia  . Diabetes mellitus without complication (HCC)  . Essential hypertension, benign  . Gout  . Hyperlipidemia  . Permanent atrial fibrillation (HCC)   on Eliquis . STEMI (ST elevation myocardial infarction) [facility])  Past Surgical History: Past Surgical History: Procedure Laterality Date . AORTOGRAM  11/12/2012  Procedure: AORTOGRAM;  Surgeon: Runell Gess, MD;  Location: [facility] CATH LAB;  Service: Cardiovascular;; . CARDIAC CATHETERIZATION  11/12/2012  Occluded ostial LAD, patent LIMA-LAD, patent mid LCx stent, 60-70% prox PDA stenosis, mild ascending aortic dilatation w/o dissection or AV insufficiency; unclear culprit lesion- ? cardioembolic vs plaque lysis . CORONARY ARTERY BYPASS GRAFT  09/07/1996  LIMA TO LAD - minimally invasive off-pump procedure . HERNIA REPAIR   . INGUINAL HERNIA REPAIR   HPI: Aaron [name] is Kalum Minner 83 y.o. male with medical history significant of CAD; afib; HLD; HTN; DM; and dementia presenting after an apparent aspiration episode.  He was at the facility and  appeared to get choked at lunchtime and vomited.  He seemed to be okay and was walking to his room and vomited again.  At that time they called 911 to send him in.  He vomited more en route.  No known SOB.  He has not had known trouble swallowing in the past, but they did modify his diet in case. He eats very little.  No fever.  +cough, chronic, takes Mucinex and nasal spray for this.   Subjective: He was coughing yesterday when he was using [employer]les straw. Assessment / Plan / Recommendation CHL IP CLINICAL IMPRESSIONS 04/07/2017 Clinical Impression  MBSS completed with Pt in the lateral position. Pt presents with mild oral phase and mild/mod pharyngeal phase and suspected primary esophageal phase dysphagia. Pt with mildly prolonged oral phase likely due to decreased dentition for mastication of solids. Pt with premature spillage of thin liquids to the level of the pyriforms with delay in swallow trigger, decreased epiglottic deflection, decreased tongue base retraction, and decreased vocal fold closure resulting in penetration during the swallow with thins and silent aspiration after the swallow with straw sips thin. Pt with pooling in the valleculae after the swallow after sips of NTL. Pharyngeal swallow essentially WNL for puree and HTL. Pt consumed barium tablet with cups sip NTL and barium tablet did not pass through GE junction despite ongoing presentations of liquids and puree. Additional bolus presented post barium tablet remained in thoracic esophagus and backflowed to cervical esophagus. Strongly recommend GI consult due to suspected narrowing near GE junction. Recommend D1/puree (for GI reasons) and NTL (for pharyngeal reasons) and SLP follow up at [facility]. Above to Dr. Lowell Guitar and RN. Pt's daughter was present for the evaluation and imaging and recommendations were reviewed with her. SLP will follow. SLP Visit Diagnosis Dysphagia, oropharyngeal phase (R13.12);Dysphagia, pharyngoesophageal phase (R13.14)  Attention and concentration deficit following -- Frontal lobe and executive function deficit following -- Impact on safety and function Mild aspiration risk;Risk for inadequate nutrition/hydration   CHL IP TREATMENT RECOMMENDATION 04/07/2017 Treatment Recommendations Therapy  as outlined in treatment plan below   Prognosis 04/07/2017 Prognosis for Safe Diet Advancement Fair Barriers to Reach Goals Cognitive deficits Barriers/Prognosis Comment -- CHL IP DIET RECOMMENDATION 04/07/2017 SLP Diet Recommendations Dysphagia 1 (Puree) solids;Nectar thick liquid Liquid Administration via Cup;No straw Medication Administration Crushed with puree Compensations Minimize environmental distractions;Slow rate;Small sips/bites Postural Changes Remain semi-upright after after feeds/meals (Comment);Seated upright at 90 degrees   CHL IP OTHER RECOMMENDATIONS 04/07/2017 Recommended Consults Consider GI evaluation Oral Care Recommendations Oral care BID;Staff/trained caregiver to provide oral care Other Recommendations Order thickener from pharmacy;Clarify dietary restrictions;Remove water pitcher;Prohibited food (jello, ice cream, thin soups)   CHL IP FOLLOW UP RECOMMENDATIONS 04/07/2017 Follow up Recommendations Home health SLP   CHL IP FREQUENCY AND DURATION 04/07/2017 Speech Therapy Frequency (ACUTE ONLY) min 2x/week Treatment Duration 1 week      CHL IP ORAL PHASE 04/07/2017 Oral Phase Impaired Oral - Pudding Teaspoon -- Oral - Pudding Cup -- Oral - Honey Teaspoon -- Oral - Honey Cup -- Oral - Nectar Teaspoon -- Oral - Nectar Cup -- Oral - Nectar Straw -- Oral - Thin Teaspoon -- Oral - Thin Cup -- Oral - Thin Straw -- Oral - Puree -- Oral - Mech Soft -- Oral - Regular Delayed oral transit Oral - Multi-Consistency -- Oral - Pill -- Oral Phase - Comment --  CHL IP PHARYNGEAL PHASE 04/07/2017 Pharyngeal Phase Impaired Pharyngeal- Pudding Teaspoon -- Pharyngeal -- Pharyngeal- Pudding Cup -- Pharyngeal -- Pharyngeal- Honey Teaspoon --  Pharyngeal -- Pharyngeal- Honey Cup WFL;Delayed swallow initiation-vallecula Pharyngeal -- Pharyngeal- Nectar Teaspoon -- Pharyngeal -- Pharyngeal- Nectar Cup Delayed swallow initiation-vallecula;Reduced epiglottic inversion;Reduced tongue base retraction;Pharyngeal residue - valleculae Pharyngeal Material does not enter airway Pharyngeal- Nectar Straw Delayed swallow initiation-vallecula;Reduced epiglottic inversion;Reduced tongue base retraction;Penetration/Apiration after swallow;Pharyngeal residue - valleculae Pharyngeal Material does not enter airway;Material enters airway, remains ABOVE vocal cords then ejected out Pharyngeal- Thin Teaspoon -- Pharyngeal -- Pharyngeal- Thin Cup Delayed swallow initiation-pyriform sinuses;Reduced epiglottic inversion;Reduced airway/laryngeal closure;Reduced tongue base retraction;Penetration/Aspiration during swallow;Pharyngeal residue - valleculae;Pharyngeal residue - pyriform Pharyngeal Material does not enter airway;Material enters airway, remains ABOVE vocal cords then ejected out;Material enters airway, remains ABOVE vocal cords and not ejected out Pharyngeal- Thin Straw Delayed swallow initiation-pyriform sinuses;Reduced epiglottic inversion;Reduced airway/laryngeal closure;Reduced tongue base retraction;Penetration/Aspiration during swallow;Penetration/Apiration after swallow;Trace aspiration;Pharyngeal residue - valleculae;Pharyngeal residue - pyriform Pharyngeal Material enters airway, passes BELOW cords without attempt by patient to eject out (silent aspiration) Pharyngeal- Puree WFL Pharyngeal -- Pharyngeal- Mechanical Soft -- Pharyngeal -- Pharyngeal- Regular WFL Pharyngeal -- Pharyngeal- Multi-consistency -- Pharyngeal -- Pharyngeal- Pill WFL Pharyngeal -- Pharyngeal Comment --  CHL IP CERVICAL ESOPHAGEAL PHASE 04/07/2017 Cervical Esophageal Phase Impaired Pudding Teaspoon -- Pudding Cup -- Honey Teaspoon -- Honey Cup -- Nectar Teaspoon -- Nectar Cup -- Nectar  Straw -- Thin Teaspoon -- Thin Cup -- Thin Straw -- Puree -- Mechanical Soft -- Regular -- Multi-consistency -- Pill Other (Comment) Cervical Esophageal Comment Suspected primary esophageal phase dysphagia Thank you, Havery Moros, CCC-SLP [phone] No flowsheet data found. PORTER,DABNEY 04/07/2017, 5:09 PM                   Scheduled Meds: . allopurinol  100 mg Oral Daily  . cefpodoxime  200 mg Oral Daily  . divalproex  125 mg Oral QHS  . donepezil  10 mg Oral QHS  . feeding supplement (ENSURE ENLIVE)  237 mL Oral BID  . Influenza vac split quadrivalent PF  0.5 mL Intramuscular Tomorrow-1000  . loratadine  10 mg Oral Daily  . mouth rinse  15 mL Mouth Rinse BID  . memantine  5 mg Oral BID  . metoprolol tartrate  37.5 mg Oral Daily  . metroNIDAZOLE  500 mg Oral Q8H   Continuous Infusions: . sodium chloride 75 mL/hr at 04/08/17 1223     LOS: 4 days    Time spent: over 20 minutes    Lacretia Nicksaldwell Powell, MD [facility] Pager [phone]  If 7PM-7AM, please contact night-coverage [email or web address] Password [email or web address] 04/08/2017, 3:15 PM

## 2017-04-09 ENCOUNTER — Encounter (HOSPITAL_COMMUNITY): Payer: Self-pay

## 2017-04-09 ENCOUNTER — Encounter (HOSPITAL_COMMUNITY)
Admission: EM | Disposition: A | Discharge status: Skilled Nursing Facility | Payer: Self-pay | Source: Home / Self Care | Attending: Family Medicine

## 2017-04-09 ENCOUNTER — Other Ambulatory Visit: Payer: Self-pay

## 2017-04-09 DIAGNOSIS — J189 Pneumonia, unspecified organism: Secondary | ICD-10-CM

## 2017-04-09 HISTORY — PX: ESOPHAGEAL DILATION: SHX303

## 2017-04-09 HISTORY — PX: ESOPHAGOGASTRODUODENOSCOPY: SHX5428

## 2017-04-09 LAB — GLUCOSE, CAPILLARY
GLUCOSE-CAPILLARY: 80 mg/dL (ref 65–99)
GLUCOSE-CAPILLARY: 94 mg/dL (ref 65–99)
Glucose-Capillary: 96 mg/dL (ref 65–99)
Glucose-Capillary: 169 mg/dL — ABNORMAL HIGH (ref 65–99)

## 2017-04-09 LAB — CULTURE, BLOOD (ROUTINE X 2)
CULTURE: NO GROWTH
Culture: NO GROWTH
SPECIAL REQUESTS: ADEQUATE

## 2017-04-09 LAB — CBC
HEMATOCRIT: 40.3 % (ref 39.0–52.0)
Hemoglobin: 13.3 g/dL (ref 13.0–17.0)
MCH: 33.9 pg (ref 26.0–34.0)
MCHC: 33 g/dL (ref 30.0–36.0)
MCV: 102.8 fL — AB (ref 78.0–100.0)
PLATELETS: 221 10*3/uL (ref 150–400)
RBC: 3.92 MIL/uL — AB (ref 4.22–5.81)
RDW: 13.5 % (ref 11.5–15.5)
WBC: 10 10*3/uL (ref 4.0–10.5)

## 2017-04-09 LAB — BASIC METABOLIC PANEL
ANION GAP: 11 (ref 5–15)
BUN: 23 mg/dL — ABNORMAL HIGH (ref 6–20)
CHLORIDE: 102 mmol/L (ref 101–111)
CO2: 29 mmol/L (ref 22–32)
Calcium: 8.3 mg/dL — ABNORMAL LOW (ref 8.9–10.3)
Creatinine, Ser: 0.92 mg/dL (ref 0.61–1.24)
GFR calc non Af Amer: >60 mL/min (ref >60)
Glucose, Bld: 87 mg/dL (ref 65–99)
POTASSIUM: 3.4 mmol/L — AB (ref 3.5–5.1)
SODIUM: 142 mmol/L (ref 135–145)

## 2017-04-09 SURGERY — EGD (ESOPHAGOGASTRODUODENOSCOPY)
Anesthesia: Moderate Sedation

## 2017-04-09 MED ORDER — SODIUM CHLORIDE 0.9 % IV SOLN: INTRAVENOUS | Status: DC

## 2017-04-09 MED ORDER — MIDAZOLAM HCL 5 MG/5ML IJ SOLN
INTRAMUSCULAR | Status: DC | PRN
  Administered 2017-04-09 (×3): 0.5 mg via INTRAVENOUS

## 2017-04-09 MED ORDER — LIDOCAINE VISCOUS 2 % MT SOLN
OROMUCOSAL | Status: DC | PRN
  Administered 2017-04-09: 4 mL via OROMUCOSAL

## 2017-04-09 MED ORDER — STERILE WATER FOR IRRIGATION IR SOLN
Status: DC | PRN
  Administered 2017-04-09: 100 mL

## 2017-04-09 MED ORDER — FAMOTIDINE 20 MG PO TABS
20.0000 mg | ORAL_TABLET | Freq: Two times a day (BID) | ORAL | Status: DC
  Administered 2017-04-09 – 2017-04-11 (×4): 20 mg via ORAL
  Filled 2017-04-09 (×6): qty 1

## 2017-04-09 MED ORDER — MIDAZOLAM HCL 5 MG/5ML IJ SOLN
INTRAMUSCULAR | Status: AC
  Filled 2017-04-09: qty 10

## 2017-04-09 MED ORDER — MEPERIDINE HCL 50 MG/ML IJ SOLN
INTRAMUSCULAR | Status: AC
  Filled 2017-04-09: qty 1

## 2017-04-09 MED ORDER — LIDOCAINE VISCOUS 2 % MT SOLN
OROMUCOSAL | Status: AC
  Filled 2017-04-09: qty 15

## 2017-04-09 NOTE — Care Management (Signed)
[facility] CM has made Aaron LusterSarah Naprier, [facility] rep, awere of additional needs. Rep will be notified when pt is discharged and will pull pt info from chart.

## 2017-04-09 NOTE — Progress Notes (Signed)
Patient has no complaints. He is drowsy but responds appropriately to questions. According to his daughter he has been confused and up all night and he has been sleeping most of the morning. CBC results noted. Patient deemed to be stable for EGD. Preop evaluation completed.

## 2017-04-09 NOTE — Care Management Note (Addendum)
Case Management Note  Patient Details  Name: Aaron [name] MRN: [id number] Date of Birth: 02/12/1924  Expected Discharge Date:      04/10/2017            Expected Discharge Plan:  Assisted Living / Rest Home(with HH)  In-House Referral:  Clinical Social Work  Discharge planning Services  CM Consult  Post Acute Care Choice:  Home Health Choice offered to:  Patient  DME Arranged:  Hospital bed(high-low bed) [facility]  HH Arranged:  PT, Speech Therapy [facility]  Status of Service:  Completed, signed off   Anticipate DC back to ALF over weekend with PT/SLP. Pt's dtr has also requested High-low bed due to aspiration, falls and osteoporosis. [facility]. [facility] and faxed referral for bed. They will attempt medicare approval and if approved [facility]. Pt does not need be delivered prior to DC. Per dtr he has lift chair that may be used in the interim. CM discussed plan with dtr Aaron Lund([name]) who voices approval.   Malcolm MetroChild[place], Aaron Creson Demske, RN 04/09/2017, 2:02 PM

## 2017-04-09 NOTE — Op Note (Addendum)
[facility] Patient Name: Aaron OatsLetcher Smigel Procedure Date: 04/09/2017 2:33 PM MRN: [id number] Date of Birth: 01/04/1924 Attending MD: Lionel DecemberNajeeb Rehman , MD CSN: [id number] Age: 83 Admit Type: Ambulatory Procedure:                Upper GI endoscopy Indications:              Esophageal dysphagia, Abnormal cine-esophagram Providers:                Lionel DecemberNajeeb Rehman, MD, Nena PolioLisa Moore, RN, Edrick Kinsammy Vaught, RN Referring MD:             Lacretia Nicksaldwell Powell, MD Medicines:                Lidocaine spray, Midazolam 1.5 mg IV Complications:            No immediate complications. Estimated Blood Loss:     Estimated blood loss was minimal. Procedure:                Pre-Anesthesia Assessment:                           - Prior to the procedure, a History and Physical                            was performed, and patient medications and                            allergies were reviewed. The patient's tolerance of                            previous anesthesia was also reviewed. The risks                            and benefits of the procedure and the sedation                            options and risks were discussed with the patient.                            All questions were answered, and informed consent                            was obtained. Prior Anticoagulants: The patient                            last took Eliquis (apixaban) 2 days prior to the                            procedure. ASA Grade Assessment: III - A patient                            with severe systemic disease. After reviewing the                            risks and benefits, the patient was deemed in  satisfactory condition to undergo the procedure.                           After obtaining informed consent, the endoscope was                            passed under direct vision. Throughout the                            procedure, the patient's blood pressure, pulse, and   oxygen saturations were monitored continuously. The                            EG-[id number]) was introduced through the mouth,                            and advanced to the second part of duodenum. The                            upper GI endoscopy was accomplished without                            difficulty. The patient tolerated the procedure                            well. Scope In: 2:58:21 PM Scope Out: 3:08:25 PM Total Procedure Duration: 0 hours 10 minutes 4 seconds  Findings:      The examined esophagus was normal.      One moderate benign-appearing, intrinsic stenosis was found 38 cm from       the incisors. This measured 1.3 cm (inner diameter) x less than one cm       (in length) and was traversed. A TTS dilator was passed through the       scope. Dilation with an 18-19-20 mm balloon dilator was performed to 18       mm and 19 mm. The dilation site was examined and showed moderate       improvement in luminal narrowing and no perforation.      A 3 cm hiatal hernia was present.      The entire examined stomach was normal.      A single erosion without bleeding was found in the duodenal bulb.      The second portion of the duodenum was normal. Impression:               - Normal esophagus.                           - Benign-appearing esophageal stenosis at GEJ.                            Dilated.                           - 3 cm hiatal hernia.                           - Normal stomach.                           -  Duodenal erosion without bleeding.                           - Normal second portion of the duodenum.                           - No specimens collected. Moderate Sedation:      Moderate (conscious) sedation was administered by the endoscopy nurse       and supervised by the endoscopist. The following parameters were       monitored: oxygen saturation, heart rate, blood pressure, CO2       capnography and response to care. Total physician intraservice time was        15 minutes. Recommendation:           - Return patient to hospital ward for ongoing care.                           - Resume previous diet today.                           - Continue present medications.                           - Famotidine 20 mg po bid.                           - H. Pylori serology.                           - Repeat upper endoscopy PRN. Procedure Code(s):        --- Professional ---                           [phone], Esophagogastroduodenoscopy, flexible,                            transoral; with transendoscopic balloon dilation of                            esophagus (less than 30 mm diameter)                           99152, Moderate sedation services provided by the                            same physician or other qualified health care                            professional performing the diagnostic or                            therapeutic service that the sedation supports,                            requiring the presence of an independent trained  observer to assist in the monitoring of the                            patient's level of consciousness and physiological                            status; initial 15 minutes of intraservice time,                            patient age 40 years or older Diagnosis Code(s):        --- Professional ---                           K22.2, Esophageal obstruction                           K44.9, Diaphragmatic hernia without obstruction or                            gangrene                           K26.9, Duodenal ulcer, unspecified as acute or                            chronic, without hemorrhage or perforation                           R13.14, Dysphagia, pharyngoesophageal phase                           R93.3, Abnormal findings on diagnostic imaging of                            other parts of digestive tract CPT copyright 2016 American Medical Association. All rights reserved. The  codes documented in this report are preliminary and upon coder review may  be revised to meet current compliance requirements. Lionel December, MD Lionel December, MD 04/09/2017 3:26:16 PM This report has been signed electronically. Number of Addenda: 0

## 2017-04-09 NOTE — Progress Notes (Signed)
PROGRESS NOTE    Aaron [name]  RUE:[id number] DOB: 09-12-1923 DOA: 04/04/2017 PCP: System, Provider Not In    Brief Narrative:  Aaron [name] a 83 y.o.malewith medical history significant ofCAD; afib; HLD; HTN; DM; and dementia presenting after an apparent aspiration episode.He was at the facility and appeared to get choked at lunchtime and vomited. He seemed to be okay and was walking to his room and vomited again. At that time they called 911to send him in. He vomited more en route. No known SOB. He has not had known trouble swallowing in the past, but they did modify his diet in case. He eats very little. No fever. +cough, chronic, takes Mucinex and nasal spray for this.      Assessment & Plan:   Principal Problem:   Aspiration pneumonia (HCC) Active Problems:   Aortic stenosis   Memory loss   Permanent atrial fibrillation (HCC)   Type 2 diabetes mellitus without complication (HCC)   Essential hypertension   Aspiration PNA -Continue with D1/puree and nectar thick liquids. -Status post EGD with dilatation today -Patient received 5 days of antibiotics for aspiration pneumonia - SLP recommendations: Dysphagia 1 solids (puree), nectar thick liquid  Aortic stenosis -Known moderate to severe disease -Has previously chosen no intervention  Dementia -Continue Ariceptand Namenda -Family counseled about how patients with dementia often do better behaviorally while hospitalized if family members are present overnight -He is on Xanax and Depakote, presumably for behavioral issues associated with dementia -Continue Melatonin -Encouraged patient's daughter to not allow him to sleep throughout the day but try to keep them up so he will sleep at night -Xanax written as a as needed as well as Haldol  Afib -Rate controlled at time of presentation and throughout ER time -Restart Eliquis per GI recommendations  DM -Diet controlled  HTN -Continue  Lopressor  CKD Stage III:  Continue to monitor with serial electrolyte panels     DVT prophylaxis: Eliquis Code Status: DNR Family Communication: Daughter is bedside Disposition Plan: Likely discharge tomorrow   Consultants:   Gastroenterology  Procedures:   EGD with esophageal dilatation  Antimicrobials:   None   Subjective: Patient was seen and evaluated prior to his procedure.  He is tired.  His daughter who is bedside states that he did not sleep at all last night.  Objective: Vitals:   04/09/17 1500 04/09/17 1505 04/09/17 1516 04/09/17 1520  BP: (!) 147/110 (!) 170/102 114/66 114/64  Pulse: (!) 103 (!) 110 (!) 112 (!) 108  Resp:      Temp:      TempSrc:      SpO2: 98% 98% 98% 98%  Weight:      Height:        Intake/Output Summary (Last 24 hours) at 04/09/2017 1625 Last data filed at 04/09/2017 1512 Gross per 24 hour  Intake 2815 ml  Output 2300 ml  Net 515 ml   Filed Weights   04/04/17 1500  Weight: 63.5 kg (140 lb)    Examination:  General exam: Appears calm and comfortable  Respiratory system: Clear to auscultation. Respiratory effort normal. Cardiovascular system: S1 & S2 heard, RRR. No JVD, murmurs, rubs, gallops or clicks. No pedal edema. Gastrointestinal system: Abdomen is nondistended, soft and nontender. No organomegaly or masses felt. Normal bowel sounds heard. Central nervous system: Alert and oriented to self only. No focal neurological deficits. Extremities: Symmetric 5 x 5 power.  And able to move all extremities independently Skin: No rashes, lesions  or ulcers Psychiatry: Judgement and insight poor due to dementia. Mood & affect appropriate.     Data Reviewed: I have personally reviewed following labs and imaging studies  CBC: Recent Labs  Lab 04/04/17 1512 04/05/17 0709 04/06/17 0919 04/07/17 0453 04/08/17 0500 04/09/17 0427  WBC 11.2* 12.5* 16.2* 15.5* 13.9* 10.0  NEUTROABS 5.8 11.8*  --   --   --   --   HGB 13.2  12.2* 11.7* 11.3* 11.6* 13.3  HCT 41.7 36.8* 35.9* 35.0* 36.4* 40.3  MCV 105.8* 102.2* 103.2* 103.6* 103.4* 102.8*  PLT 227 205 204 219 216 221   Basic Metabolic Panel: Recent Labs  Lab 04/05/17 0709 04/06/17 0919 04/07/17 0453 04/08/17 0500 04/09/17 0427  NA 141 143 145 143 142  K 4.5 4.3 4.5 3.6 3.4*  CL 106 108 110 103 102  CO2 23 26 26 29 29   GLUCOSE 148* 147* 96 94 87  BUN 25* 34* 35* 30* 23*  CREATININE 1.31* 1.40* 1.14 1.05 0.92  CALCIUM 8.3* 8.6* 8.5* 8.5* 8.3*   GFR: Estimated Creatinine Clearance: 47 mL/min (by C-G formula based on SCr of 0.92 mg/dL). Liver Function Tests: No results for input(s): AST, ALT, ALKPHOS, BILITOT, PROT, ALBUMIN in the last 168 hours. No results for input(s): LIPASE, AMYLASE in the last 168 hours. No results for input(s): AMMONIA in the last 168 hours. Coagulation Profile: No results for input(s): INR, PROTIME in the last 168 hours. Cardiac Enzymes: Recent Labs  Lab 04/04/17 1512  TROPONINI <0.03   BNP (last 3 results) No results for input(s): PROBNP in the last 8760 hours. HbA1C: No results for input(s): HGBA1C in the last 72 hours. CBG: Recent Labs  Lab 04/08/17 1634 04/08/17 2046 04/09/17 0823 04/09/17 1155 04/09/17 1600  GLUCAP 91 77 80 96 94   Lipid Profile: No results for input(s): CHOL, HDL, LDLCALC, TRIG, CHOLHDL, LDLDIRECT in the last 72 hours. Thyroid Function Tests: No results for input(s): TSH, T4TOTAL, FREET4, T3FREE, THYROIDAB in the last 72 hours. Anemia Panel: No results for input(s): VITAMINB12, FOLATE, FERRITIN, TIBC, IRON, RETICCTPCT in the last 72 hours. Sepsis Labs: Recent Labs  Lab 04/04/17 1957  PROCALCITON 0.25    Recent Results (from the past 240 hour(s))  Culture, blood (routine x 2) Call MD if unable to obtain prior to antibiotics being given     Status: None   Collection Time: 04/04/17  7:51 PM  Result Value Ref Range Status   Specimen Description BLOOD RIGHT ARM  Final   Special  Requests   Final    BOTTLES DRAWN AEROBIC ONLY Blood Culture results may not be optimal due to an inadequate volume of blood received in culture bottles   Culture NO GROWTH 5 DAYS  Final   Report Status 04/09/2017 FINAL  Final  Culture, blood (routine x 2) Call MD if unable to obtain prior to antibiotics being given     Status: None   Collection Time: 04/04/17  7:57 PM  Result Value Ref Range Status   Specimen Description LEFT ANTECUBITAL  Final   Special Requests   Final    BOTTLES DRAWN AEROBIC AND ANAEROBIC Blood Culture adequate volume   Culture NO GROWTH 5 DAYS  Final   Report Status 04/09/2017 FINAL  Final  MRSA PCR Screening     Status: None   Collection Time: 04/04/17  9:29 PM  Result Value Ref Range Status   MRSA by PCR NEGATIVE NEGATIVE Final    Comment:  The GeneXpert MRSA Assay (FDA approved for NASAL specimens only), is one component of a comprehensive MRSA colonization surveillance program. It is not intended to diagnose MRSA infection nor to guide or monitor treatment for MRSA infections.          Radiology Studies: No results found.      Scheduled Meds: . allopurinol  100 mg Oral Daily  . cefpodoxime  200 mg Oral Daily  . divalproex  125 mg Oral QHS  . donepezil  10 mg Oral QHS  . famotidine  20 mg Oral BID  . feeding supplement (ENSURE ENLIVE)  237 mL Oral BID  . Influenza vac split quadrivalent PF  0.5 mL Intramuscular Tomorrow-1000  . lidocaine      . loratadine  10 mg Oral Daily  . mouth rinse  15 mL Mouth Rinse BID  . memantine  5 mg Oral BID  . metoprolol tartrate  37.5 mg Oral Daily  . metroNIDAZOLE  500 mg Oral Q8H  . midazolam       Continuous Infusions: . sodium chloride 75 mL/hr at 04/08/17 1223     LOS: 5 days    Time spent: 35 minutes    Katrinka BlazingAlex U Kadolph, MD [facility] Pager [phone]  If 7PM-7AM, please contact night-coverage [email or web address] Password [email or web address] 04/09/2017, 4:25 PM

## 2017-04-09 NOTE — Care Management Important Message (Signed)
Important Message  Patient Details  Name: Aaron [name] MRN: [id number] Date of Birth: 04/01/1924   Medicare Important Message Given:  Yes    Malcolm Metrohildress, Marea Reasner Demske, RN 04/09/2017, 2:10 PM

## 2017-04-09 NOTE — Care Management (Signed)
Patient Information   Patient Name Delphia GratesChildress, Kash E ([id number]) Sex Male DOB 10/18/1923  Room Bed  A323 A323-01  Patient Demographics   Address [address] [place] [address] Phone [phone] [facility](Home) [phone] (Mobile)  Patient Ethnicity & Race   Ethnic Group Patient Race  Not Hispanic or Latino White or Caucasian  Emergency Contact(s)   Name Relation Home Work Mobile  [place]aus[place],[name] Daughter [phone]  [phone]  Huffines,Cindy Daughter [phone]  [phone]  Documents on File    Status Date Received Description  Documents for the Patient  EMR Medication Summary Not Received    EMR Problem Summary Not Received    EMR Patient Summary Not Received    Driver's License Not Received    Historic Radiology Documentation Not Received    [place] HIPAA NOTICE OF PRIVACY - Scanned Received 09/17/10   [place] E-Signature HIPAA Notice of Privacy Received 09/17/10   [place] E-Signature HIPAA Notice of Privacy Spanish Not Received    Insurance Card Received 06/01/15 mcare/[employer] life  Advance Directives/Living Will/HCPOA/POA Not Received    Insurance Card Not Received    Technical brewerinancial Application Not Received    Advance Directives/Living Will/HCPOA/POA Not Received    [facility] E-Signature HIPAA Notice of Privacy Received 11/28/12   Insurance Card Not Received    Insurance Card Received 11/28/12   HIM ROI Authorization Not Received    Insurance Card Received 04/28/13   Insurance Card Not Received    AMB Correspondence  06/13/13 01/15 card  Insurance Card Received 01/18/14 Medicare  Other Photo ID Not Received    Insurance Card Received 04/25/15   [place] E-Signature HIPAA Notice of Privacy Received 06/01/15   AMB Provider Completed Forms  07/06/16 MEDICAL CLEARANCE REQUEST [facility]  Advance Directives/Living Will/HCPOA/POA Received 04/07/17   Advance Directives/Living Will/HCPOA/POA Received 04/07/17   Driver's License  (Deleted) 10/04/09   Documents for the Encounter  AOB (Assignment of Insurance Benefits) Not Received    E-signature AOB Signed 04/04/17   MEDICARE RIGHTS Not Received    E-signature Medicare Rights Signed 04/04/17   ED Patient Billing Extract   ED PB Billing Extract  Correspondence Received 04/07/17   EKG Received 04/05/17   Study Attachment for Report   External Report  Admission Information   Attending Provider Admitting Provider Admission Type Admission Date/Time  Filbert SchilderKadolph, Alexandria U, MD Jonah BlueYates, Jennifer, MD Emergency 04/04/17 1455  Discharge Date Hospital Service Auth/Cert Status Service Area   Internal Medicine Incomplete  SERVICE AREA  Unit Room/Bed Admission Status   AP-DEPT 300 A323/A323-01 Admission (Confirmed)   Admission   Complaint  emesis  Hospital Account   Name Acct ID Class Status Primary Coverage  Delphia GratesChildress, Devario E [id number] Inpatient Open MEDICARE - MEDICARE PART A AND B      Guarantor Account (for Hospital Account [id number])   Name Relation to Pt Service Area Active? Acct Type  Delphia Grateshildress, Montgomery E Self CHSA Yes Personal/Family  Address Phone    [address] [place], [place] [address] [phone](H)        Coverage Information (for Hospital Account [id number])   F/O Payor/Plan Precert #  MEDICARE/MEDICARE PART A AND B   Subscriber Subscriber #  Delphia GratesChildress, Skylur E [id number]  Address Phone  [address] [place], [place] [phone]

## 2017-04-09 NOTE — Progress Notes (Signed)
Physical Therapy Treatment Patient Details Name: Aaron [name] MRN: [id number] DOB: 10/12/1923 Today's Date: 04/09/2017    History of Present Illness Aaron [name] is a 83 y.o. male with medical history significant of CAD; afib; HLD; HTN; DM; and dementia presenting after an apparent aspiration episode.  He was at the facility and appeared to get choked at lunchtime and vomited.  He seemed to be okay and was walking to his room and vomited again.  At that time they called 911 to send him in.  He vomited more en route.  No known SOB.  He has not had known trouble swallowing in the past, but they did modify his diet in case. He eats very little.  No fever.  +cough, chronic, takes Mucinex and nasal spray for this.      PT Comments    Patient presents in bed with his daughter in room and she states the patient did not get any sleep last night and very fatigued today.  Patient demonstrated limitation for taking steps and maintaining standing balance due to fatigue, slightly lethargic and generalized weakness.  Patient put back to bed with Mod assist to reposition.  Patient will benefit from continued physical therapy in hospital and recommended venue below to increase strength, balance, endurance for safe ADLs and gait.  Patient suffers from dementia  which impairs his ability to perform daily activities like walking and getting out of bed safely in the home.  A regular bed is unsafe for patient due to potential for injury if he rolls out of bed at night while sleeping or attempt to get up without assistance.  A High-Low bed that can be lowered to floor will prevent patient from serious injury if he rolls out of bed or attempt to get up without assistance.  This patient will benefit greatly from use of High-Low bed for preventing falls to floor.    Follow Up Recommendations  Home health PT;Supervision/Assistance - 24 hour     Equipment Recommendations       Recommendations for Other  Services       Precautions / Restrictions Precautions Precautions: Fall Restrictions Weight Bearing Restrictions: No    Mobility  Bed Mobility Overal bed mobility: Needs Assistance Bed Mobility: Supine to Sit;Sit to Supine     Supine to sit: Mod assist Sit to supine: Mod assist   General bed mobility comments: fatigued today, required more assistance  Transfers Overall transfer level: Needs assistance Equipment used: Rolling walker (2 wheeled) Transfers: Sit to/from Stand Sit to Stand: Mod assist         General transfer comment: slow labored  movement requiring VC's to scoot closer before attempting to stand  Ambulation/Gait Ambulation/Gait assistance: Mod assist Ambulation Distance (Feet): 3 Feet Assistive device: Rolling walker (2 wheeled)       General Gait Details: patient limited to 3-4 side steps at bedside due to fatigue/generalized weakness   Stairs            Wheelchair Mobility    Modified Rankin (Stroke Patients Only)       Balance Overall balance assessment: Needs assistance Sitting-balance support: No upper extremity supported;Feet supported Sitting balance-Leahy Scale: Good     Standing balance support: Bilateral upper extremity supported;During functional activity Standing balance-Leahy Scale: Poor                              Cognition   Behavior During Therapy: [facility]  for tasks assessed/performed Overall Cognitive Status: Within Functional Limits for tasks assessed                                        Exercises General Exercises - Lower Extremity Long Arc Quad: Seated;AROM;Strengthening;Both;5 reps Hip Flexion/Marching: Seated;AROM;Strengthening;Both;5 reps    General Comments        Pertinent Vitals/Pain Pain Assessment: No/denies pain    Home Living                      Prior Function            PT Goals (current goals can now be found in the care plan section) Acute  Rehab PT Goals Patient Stated Goal: return home PT Goal Formulation: With patient/family Time For Goal Achievement: 04/10/17 Potential to Achieve Goals: Good Progress towards PT goals: Progressing toward goals    Frequency    Min 3X/week      PT Plan Current plan remains appropriate    Co-evaluation              AM-PAC PT "6 Clicks" Daily Activity  Outcome Measure    Difficulty moving from lying on back to sitting on the side of the bed? : A Little Difficulty sitting down on and standing up from a chair with arms (e.g., wheelchair, bedside commode, etc,.)?: A Little Help needed moving to and from a bed to chair (including a wheelchair)?: A Little Help needed walking in hospital room?: A Little Help needed climbing 3-5 steps with a railing? : A Lot 6 Click Score: 14    End of Session Equipment Utilized During Treatment: Gait belt Activity Tolerance: Patient limited by fatigue;Patient limited by lethargy Patient left: in bed;with call bell/phone within reach;with bed alarm set;with family/visitor present Nurse Communication: Mobility status PT Visit Diagnosis: Unsteadiness on feet (R26.81);Other abnormalities of gait and mobility (R26.89);Muscle weakness (generalized) (M62.81)     Time: [id number] PT Time Calculation (min) (ACUTE ONLY): 18 min  Charges:  $Therapeutic Activity: 8-22 mins                    G Codes:       12:08 PM, 04/09/17 Ocie BobJames Paz Winsett, MPT Physical Therapist with [facility] 336 [phone] office [phone] mobile phone

## 2017-04-10 LAB — GLUCOSE, CAPILLARY
GLUCOSE-CAPILLARY: 119 mg/dL — AB (ref 65–99)
GLUCOSE-CAPILLARY: 96 mg/dL (ref 65–99)
Glucose-Capillary: 85 mg/dL (ref 65–99)
Glucose-Capillary: 111 mg/dL — ABNORMAL HIGH (ref 65–99)

## 2017-04-10 MED ORDER — FAMOTIDINE 20 MG PO TABS: 20.0000 mg | ORAL_TABLET | Freq: Two times a day (BID) | ORAL | 0 refills | Status: AC

## 2017-04-10 MED ORDER — RESOURCE THICKENUP CLEAR PO POWD: 1.0000 | ORAL | 0 refills | Status: AC | PRN

## 2017-04-10 NOTE — [place] FL2 (Signed)
[place] MEDICAID FL2 LEVEL OF CARE SCREENING TOOL     IDENTIFICATION  Patient Name: Aaron [name] Birthdate: 12/17/1923 Sex: male Admission Date (Current Location): 04/04/2017  [facility] [place]   Facility and Address:  [facility],  [address], [place] [id number]      Provider Number: [id number]  Attending Physician Name and Address:  Filbert SchilderKadolph, [place] U, MD  Relative Name and Phone Number:  Evlyn KannerJanet French [phone]    Current Level of Care: Hospital Recommended Level of Care: Skilled Nursing Facility Prior Approval Number:    Date Approved/Denied:   PASRR Number:    Discharge Plan: SNF    Current Diagnoses: Patient Active Problem List   Diagnosis Date Noted  . Aspiration pneumonia (HCC) 04/04/2017  . Type 2 diabetes mellitus without complication (HCC) 04/04/2017  . Essential hypertension 04/04/2017  . Permanent atrial fibrillation (HCC) 11/21/2012  . Memory loss 11/14/2012  . Mixed hyperlipidemia 09/11/2008  . CAD, NATIVE VESSEL 09/11/2008  . Aortic stenosis 09/11/2008    Orientation RESPIRATION BLADDER Height & Weight     Self  O2(nasal 3 liters) Incontinent Weight: 140 lb (63.5 kg) Height:  5\' 8"  (172.7 cm)  BEHAVIORAL SYMPTOMS/MOOD NEUROLOGICAL BOWEL NUTRITION STATUS      Continent Diet(please see dc summary)  AMBULATORY STATUS COMMUNICATION OF NEEDS Skin   Limited Assist Verbally Normal                       Personal Care Assistance Level of Assistance  Bathing, Feeding, Dressing Bathing Assistance: Maximum assistance Feeding assistance: Maximum assistance Dressing Assistance: Maximum assistance     Functional Limitations Info  Sight, Hearing Sight Info: Impaired Hearing Info: Impaired Speech Info: Adequate    SPECIAL CARE FACTORS FREQUENCY  PT (By licensed PT), Speech therapy     PT Frequency: 5 X weeks       Speech Therapy Frequency: 3 X weeks      Contractures Contractures  Info: Not present    Additional Factors Info  Allergies, Code Status, Psychotropic Code Status Info: DNR Allergies Info: Penicillins, Asa Aspirin Psychotropic Info: (depakote)         Current Medications (04/10/2017):  This is the current hospital active medication list Current Facility-Administered Medications  Medication Dose Route Frequency Provider Last Rate Last Dose  . 0.9 %  sodium chloride infusion   Intravenous Continuous Jonah BlueYates, Jennifer, MD 75 mL/hr at 04/10/17 0400    . allopurinol (ZYLOPRIM) tablet 100 mg  100 mg Oral Daily Jonah BlueYates, Jennifer, MD   100 mg at 04/10/17 1149  . ALPRAZolam Prudy Feeler(XANAX) tablet 0.25 mg  0.25 mg Oral TID PRN Jonah BlueYates, Jennifer, MD   0.25 mg at 04/08/17 1329  . divalproex (DEPAKOTE) DR tablet 125 mg  125 mg Oral Noemi ChapelQHS Yates, Jennifer, MD   125 mg at 04/09/17 2137  . donepezil (ARICEPT) tablet 10 mg  10 mg Oral QHS Jonah BlueYates, Jennifer, MD   10 mg at 04/09/17 2137  . famotidine (PEPCID) tablet 20 mg  20 mg Oral BID Malissa Hippoehman, Najeeb U, MD   20 mg at 04/10/17 1149  . feeding supplement (ENSURE ENLIVE) (ENSURE ENLIVE) liquid 237 mL  237 mL Oral BID Jonah BlueYates, Jennifer, MD   237 mL at 04/08/17 2143  . haloperidol lactate (HALDOL) injection 1 mg  1 mg Intravenous Q6H PRN Zigmund DanielPowell, A Caldwell Jr., MD   1 mg at 04/08/17 2141  . Influenza vac split quadrivalent PF (FLUZONE HIGH-DOSE) injection 0.5  mL  0.5 mL Intramuscular Tomorrow-1000 Zigmund DanielPowell, A Caldwell Jr., MD      . ipratropium (ATROVENT) 0.03 % nasal spray 2 spray  2 spray Each Nare BID PRN Jonah BlueYates, Jennifer, MD      . loratadine (CLARITIN) tablet 10 mg  10 mg Oral Daily Jonah BlueYates, Jennifer, MD   10 mg at 04/10/17 1149  . MEDLINE mouth rinse  15 mL Mouth Rinse BID Zigmund DanielPowell, A Caldwell Jr., MD   15 mL at 04/09/17 2142  . memantine (NAMENDA) tablet 5 mg  5 mg Oral BID Jonah BlueYates, Jennifer, MD   5 mg at 04/10/17 1149  . metoprolol tartrate (LOPRESSOR) tablet 37.5 mg  37.5 mg Oral Daily Jonah BlueYates, Jennifer, MD   37.5 mg at 04/10/17 1148  . RESOURCE  THICKENUP CLEAR   Oral PRN Zigmund DanielPowell, A Caldwell Jr., MD         Discharge Medications: Please see discharge summary for a list of discharge medications.  Relevant Imaging Results:  Relevant Lab Results:   Additional Information SSN: [id number]  Arvin Collardara W Trevia Nop, [place]

## 2017-04-10 NOTE — Progress Notes (Signed)
Nursing notified by patient's daughter that personnel from [facility] had seen patient this afternoon to evaluate for return to facility. Stated they feel he needs higher level of care and would need SNF/rehab instead of [facility]. Notified MD.stated to speak with CSW regarding next steps. Notified on-call CSW. Stated they will start process for seeking another placement. Text-paged Dr. Rinaldo RatelKadolph to notify. Morrie SheldonAshley Keanen Dohse,RN

## 2017-04-10 NOTE — Discharge Summary (Signed)
Physician Discharge Summary  Aaron [name] XBJ:[id number] DOB: 04/12/1924 DOA: 04/04/2017  PCP: System, Provider Not In  Admit date: 04/04/2017 Discharge date: 04/10/2017  Admitted From: Assisted living memory care  Disposition:  SNF  Recommendations for Outpatient Follow-up:  1. Follow up with PCP in 1-2 weeks 2. Please obtain BMP/CBC in one week 3. Please follow up on the following pending results:  Home Health: Yes-PT, RN, aide, speech-language pathology Equipment/Devices: Hospital bed (high low bed)  Discharge Condition: Stable but guarded CODE STATUS: DNR Diet recommendation: Nectar thick liquids, dysphagia 1 diet, ground medications into food  Brief/Interim Summary: Aaron [name] is a 83 y.o. male with medical history significant of CAD; afib; HLD; HTN; DM; and dementia presenting after an apparent aspiration episode.  He was at the facility and appeared to get choked at lunchtime and vomited.  He seemed to be okay and was walking to his room and vomited again.  At that time they called 911 to send him in.  He vomited more en route.  No known SOB.  He has not had known trouble swallowing in the past, but they did modify his diet in case. He eats very little.  No fever.  +cough, chronic, takes Mucinex and nasal spray for this.    Patient was admitted to the hospital and treated for aspiration pneumonia.  Speech-language pathology evaluated patient and he underwent a barium swallow test showing the barium tablet not passing through the GE junction despite ongoing presentations of liquids and pure.  Additional bolus presented post barium tablet remained in thoracic esophagus and backflow to cervical esophagus.  GI was consulted.  Patient underwent EGD with esophageal dilatation on 04/09/2017.  His Eliquis has been held 2 days prior to this procedure.  Case management assisted in setting patient up with home health PT and speech-language pathology at time of discharge.   Patient's family voices understanding that he may continue to aspirate even though his esophagus was dilated.  They voice that they will try to use verbal cues, sit him up after meals, and continue nectar Rich liquids as well as a D1/pured diet.  Famotidine prescription was given at time of discharge.  Dr. Karilyn Cotaehman to follow-up with patient for H. pylori serologies taken at time of EGD. Patient found to no longer be eligible for assisted living and so SNF placement was sought by CSW.  Discharge Diagnoses:  Principal Problem:   Aspiration pneumonia (HCC) Active Problems:   Aortic stenosis   Memory loss   Permanent atrial fibrillation (HCC)   Type 2 diabetes mellitus without complication [facility](HCC)   Essential hypertension    Discharge Instructions  Discharge Instructions    Call MD for:  difficulty breathing, headache or visual disturbances   Complete by:  As directed    Call MD for:  hives   Complete by:  As directed    Call MD for:  persistant dizziness or light-headedness   Complete by:  As directed    Call MD for:  persistant nausea and vomiting   Complete by:  As directed    Call MD for:  severe uncontrolled pain   Complete by:  As directed    Call MD for:  temperature >100.4   Complete by:  As directed    Discharge instructions   Complete by:  As directed    Nectar thick liquids and dysphagia 1 diet; please ground all medications into food   Increase activity slowly   Complete by:  As directed  Allergies as of 04/10/2017      Reactions   Penicillins Hives   Unknown. Has patient had a PCN reaction causing immediate rash, facial/tongue/throat swelling, SOB or lightheadedness with hypotension: No / unknown Has patient had a PCN reaction causing severe rash involving mucus membranes or skin necrosis: No/ unknown Has patient had a PCN reaction that required hospitalization No/ unknown Has patient had a PCN reaction occurring within the last 10 years: No If all of the above  answers are "NO", then may proceed with Cephalosporin use.   Asa [aspirin] Hives      Medication List    TAKE these medications   allopurinol 100 MG tablet Commonly known as:  ZYLOPRIM Take 100 mg by mouth daily.   ALPRAZolam 0.5 MG tablet Commonly known as:  XANAX Take 0.25 mg by mouth 3 (three) times daily as needed for anxiety.   apixaban 2.5 MG Tabs tablet Commonly known as:  ELIQUIS Take 2.5 mg by mouth 2 (two) times daily. For afib   ASPERCREME W/LIDOCAINE 4 % cream Generic drug:  lidocaine Apply 1 application topically 3 (three) times daily as needed (back pain).   cetirizine 10 MG tablet Commonly known as:  ZYRTEC Take 10 mg by mouth daily.   divalproex 125 MG DR tablet Commonly known as:  DEPAKOTE Take 125 mg by mouth at bedtime.   donepezil 10 MG tablet Commonly known as:  ARICEPT Take 10 mg by mouth at bedtime.   ENSURE HIGH PROTEIN Liqd Take 1 Can by mouth 2 (two) times daily.   famotidine 20 MG tablet Commonly known as:  PEPCID Take 1 tablet (20 mg total) by mouth 2 (two) times daily.   guaifenesin 100 MG/5ML syrup Commonly known as:  ROBITUSSIN Take 200 mg by mouth every 6 (six) hours as needed for cough.   ipratropium 0.06 % nasal spray Commonly known as:  ATROVENT Place 2 sprays into both nostrils 2 (two) times daily as needed for rhinitis.   Melatonin 3 MG Tabs Take 1 tablet by mouth at bedtime.   memantine 5 MG tablet Commonly known as:  NAMENDA Take 5 mg by mouth 2 (two) times daily.   metoprolol tartrate 25 MG tablet Commonly known as:  LOPRESSOR Take 37.5 mg by mouth daily.   mineral oil liquid Take by mouth once a week. *Instill one drop into each ear once weekly   MINTOX 200-200-20 MG/5ML suspension Generic drug:  alum & mag hydroxide-simeth Take 10 mLs by mouth 3 (three) times daily as needed for indigestion or heartburn.   nitroGLYCERIN 0.4 MG SL tablet Commonly known as:  NITROSTAT Place 1 tablet (0.4 mg total) under the  tongue every 5 (five) minutes as needed for chest pain.   REFRESH OP Place 1 drop into the left eye 2 (two) times daily as needed (dry or itching eyes.).   RESOURCE THICKENUP CLEAR Powd Take 120 g by mouth as needed (thicken all liquids to Nectar thick).            Durable Medical Equipment  (From admission, onward)        Start     Ordered   04/10/17 1337  For home use only DME Suction  Once    Question:  Suction  Answer:  Oral   04/10/17 1338   04/10/17 1036  For home use only DME Nebulizer machine  Once    Question:  Patient needs a nebulizer to treat with the following condition  Answer:  Wheezing  04/10/17 1035   04/09/17 1100  For home use only DME Hospital bed  Once    Comments:  High-low, fully electric bed  Question Answer Comment  Patient has (list medical condition): esophageal motility disorder  at high risk for continued aspiration   The above medical condition requires: Patient requires the ability to reposition frequently   Head must be elevated greater than: 45 degrees   Bed type Semi-electric      04/09/17 1106      Contact information for follow-up providers    Rehman, Joline Maxcy, MD. Schedule an appointment as soon as possible for a visit.   Specialty:  Gastroenterology Contact information: [address], [address] [place] [address] [phone]            Contact information for after-discharge care    Destination    [facility] Follow up.   Service:  Assisted Living Contact information: [address] [place] [address] [phone]                 Allergies  Allergen Reactions  . Penicillins Hives    Unknown. Has patient had a PCN reaction causing immediate rash, facial/tongue/throat swelling, SOB or lightheadedness with hypotension: No / unknown  Has patient had a PCN reaction causing severe rash involving mucus membranes or skin necrosis: No/ unknown Has patient had a PCN reaction that  required hospitalization No/ unknown Has patient had a PCN reaction occurring within the last 10 years: No If all of the above answers are "NO", then may proceed with Cephalosporin use.    Jonne Ply [Aspirin] Hives    Consultations:  Gastroenterology  Speech language pathology  Physical therapy   Procedures/Studies: Dg Chest Port 1 View  Result Date: 04/04/2017 CLINICAL DATA:  Job CT lunch.  Possible aspiration. EXAM: PORTABLE CHEST 1 VIEW COMPARISON:  08/16/2016 FINDINGS: There is opacity at the left lung base silhouetting the left hemidiaphragm. This is consistent with aspiration pneumonitis. Remainder of the lungs is clear. Possible left pleural effusion. No right pleural effusion. No pneumothorax. There stable changes from a previous median sternotomy. Cardiac silhouette is mildly enlarged. No mediastinal or hilar masses. Skeletal structures are grossly intact. IMPRESSION: 1. Left lung base opacity consistent with aspiration pneumonitis given the patient's history. Electronically Signed   By: Amie Portland M.D.   On: 04/04/2017 15:24   Dg Swallowing Func-speech Pathology  Result Date: 04/07/2017 Objective Swallowing Evaluation: Type of Study: MBS-Modified Barium Swallow Study  Patient Details Name: Aaron [name] MRN: [id number] Date of Birth: December 17, 1923 Today's Date: 04/07/2017 Time: SLP Start Time (ACUTE ONLY): 1400 -SLP Stop Time (ACUTE ONLY): 1440 SLP Time Calculation (min) (ACUTE ONLY): 40 min Past Medical History: Past Medical History: Diagnosis Date . Aortic stenosis   Probable moderate with discordant gradient (mild) and area (severe); refused replacement . Coronary atherosclerosis of native coronary artery   a. S/P CABG 1998,  b. PCI to CFX with BMS 2008 => ISR => s/p Cypher DES + POBA;  c. inf STEMI 8/14: LHC 11/12/12:  LAD occluded at the origin, mid CFX stent patent, proximal PDA 60-70%, LIMA-LAD patent. Unclear culprit for his inferior STEMI => med Rx (? embolic from AFib) .  Dementia  . Diabetes mellitus without complication (HCC)  . Essential hypertension, benign  . Gout  . Hyperlipidemia  . Permanent atrial fibrillation (HCC)   on Eliquis . STEMI (ST elevation myocardial infarction) [facility])  Past Surgical History: Past Surgical History: Procedure Laterality  Date . AORTOGRAM  11/12/2012  Procedure: AORTOGRAM;  Surgeon: Runell Gess, MD;  Location: [facility] CATH LAB;  Service: Cardiovascular;; . CARDIAC CATHETERIZATION  11/12/2012  Occluded ostial LAD, patent LIMA-LAD, patent mid LCx stent, 60-70% prox PDA stenosis, mild ascending aortic dilatation w/o dissection or AV insufficiency; unclear culprit lesion- ? cardioembolic vs plaque lysis . CORONARY ARTERY BYPASS GRAFT  09/07/1996  LIMA TO LAD - minimally invasive off-pump procedure . HERNIA REPAIR   . INGUINAL HERNIA REPAIR   HPI: CAMBREN HELM is a 83 y.o. male with medical history significant of CAD; afib; HLD; HTN; DM; and dementia presenting after an apparent aspiration episode.  He was at the facility and appeared to get choked at lunchtime and vomited.  He seemed to be okay and was walking to his room and vomited again.  At that time they called 911 to send him in.  He vomited more en route.  No known SOB.  He has not had known trouble swallowing in the past, but they did modify his diet in case. He eats very little.  No fever.  +cough, chronic, takes Mucinex and nasal spray for this.   Subjective: He was coughing yesterday when he was using a straw. Assessment / Plan / Recommendation CHL IP CLINICAL IMPRESSIONS 04/07/2017 Clinical Impression  MBSS completed with Pt in the lateral position. Pt presents with mild oral phase and mild/mod pharyngeal phase and suspected primary esophageal phase dysphagia. Pt with mildly prolonged oral phase likely due to decreased dentition for mastication of solids. Pt with premature spillage of thin liquids to the level of the pyriforms with delay in swallow trigger, decreased epiglottic deflection,  decreased tongue base retraction, and decreased vocal fold closure resulting in penetration during the swallow with thins and silent aspiration after the swallow with straw sips thin. Pt with pooling in the valleculae after the swallow after sips of NTL. Pharyngeal swallow essentially WNL for puree and HTL. Pt consumed barium tablet with cups sip NTL and barium tablet did not pass through GE junction despite ongoing presentations of liquids and puree. Additional bolus presented post barium tablet remained in thoracic esophagus and backflowed to cervical esophagus. Strongly recommend GI consult due to suspected narrowing near GE junction. Recommend D1/puree (for GI reasons) and NTL (for pharyngeal reasons) and SLP follow up at [facility]. Above to Dr. Lowell Guitar and RN. Pt's daughter was present for the evaluation and imaging and recommendations were reviewed with her. SLP will follow. SLP Visit Diagnosis Dysphagia, oropharyngeal phase (R13.12);Dysphagia, pharyngoesophageal phase (R13.14) Attention and concentration deficit following -- Frontal lobe and executive function deficit following -- Impact on safety and function Mild aspiration risk;Risk for inadequate nutrition/hydration   CHL IP TREATMENT RECOMMENDATION 04/07/2017 Treatment Recommendations Therapy as outlined in treatment plan below   Prognosis 04/07/2017 Prognosis for Safe Diet Advancement Fair Barriers to Reach Goals Cognitive deficits Barriers/Prognosis Comment -- CHL IP DIET RECOMMENDATION 04/07/2017 SLP Diet Recommendations Dysphagia 1 (Puree) solids;Nectar thick liquid Liquid Administration via Cup;No straw Medication Administration Crushed with puree Compensations Minimize environmental distractions;Slow rate;Small sips/bites Postural Changes Remain semi-upright after after feeds/meals (Comment);Seated upright at 90 degrees   CHL IP OTHER RECOMMENDATIONS 04/07/2017 Recommended Consults Consider GI evaluation Oral Care Recommendations Oral care  BID;Staff/trained caregiver to provide oral care Other Recommendations Order thickener from pharmacy;Clarify dietary restrictions;Remove water pitcher;Prohibited food (jello, ice cream, thin soups)   CHL IP FOLLOW UP RECOMMENDATIONS 04/07/2017 Follow up Recommendations Home health SLP   CHL IP FREQUENCY AND DURATION 04/07/2017 Speech Therapy  Frequency (ACUTE ONLY) min 2x/week Treatment Duration 1 week      CHL IP ORAL PHASE 04/07/2017 Oral Phase Impaired Oral - Pudding Teaspoon -- Oral - Pudding Cup -- Oral - Honey Teaspoon -- Oral - Honey Cup -- Oral - Nectar Teaspoon -- Oral - Nectar Cup -- Oral - Nectar Straw -- Oral - Thin Teaspoon -- Oral - Thin Cup -- Oral - Thin Straw -- Oral - Puree -- Oral - Mech Soft -- Oral - Regular Delayed oral transit Oral - Multi-Consistency -- Oral - Pill -- Oral Phase - Comment --  CHL IP PHARYNGEAL PHASE 04/07/2017 Pharyngeal Phase Impaired Pharyngeal- Pudding Teaspoon -- Pharyngeal -- Pharyngeal- Pudding Cup -- Pharyngeal -- Pharyngeal- Honey Teaspoon -- Pharyngeal -- Pharyngeal- Honey Cup WFL;Delayed swallow initiation-vallecula Pharyngeal -- Pharyngeal- Nectar Teaspoon -- Pharyngeal -- Pharyngeal- Nectar Cup Delayed swallow initiation-vallecula;Reduced epiglottic inversion;Reduced tongue base retraction;Pharyngeal residue - valleculae Pharyngeal Material does not enter airway Pharyngeal- Nectar Straw Delayed swallow initiation-vallecula;Reduced epiglottic inversion;Reduced tongue base retraction;Penetration/Apiration after swallow;Pharyngeal residue - valleculae Pharyngeal Material does not enter airway;Material enters airway, remains ABOVE vocal cords then ejected out Pharyngeal- Thin Teaspoon -- Pharyngeal -- Pharyngeal- Thin Cup Delayed swallow initiation-pyriform sinuses;Reduced epiglottic inversion;Reduced airway/laryngeal closure;Reduced tongue base retraction;Penetration/Aspiration during swallow;Pharyngeal residue - valleculae;Pharyngeal residue - pyriform Pharyngeal  Material does not enter airway;Material enters airway, remains ABOVE vocal cords then ejected out;Material enters airway, remains ABOVE vocal cords and not ejected out Pharyngeal- Thin Straw Delayed swallow initiation-pyriform sinuses;Reduced epiglottic inversion;Reduced airway/laryngeal closure;Reduced tongue base retraction;Penetration/Aspiration during swallow;Penetration/Apiration after swallow;Trace aspiration;Pharyngeal residue - valleculae;Pharyngeal residue - pyriform Pharyngeal Material enters airway, passes BELOW cords without attempt by patient to eject out (silent aspiration) Pharyngeal- Puree WFL Pharyngeal -- Pharyngeal- Mechanical Soft -- Pharyngeal -- Pharyngeal- Regular WFL Pharyngeal -- Pharyngeal- Multi-consistency -- Pharyngeal -- Pharyngeal- Pill WFL Pharyngeal -- Pharyngeal Comment --  CHL IP CERVICAL ESOPHAGEAL PHASE 04/07/2017 Cervical Esophageal Phase Impaired Pudding Teaspoon -- Pudding Cup -- Honey Teaspoon -- Honey Cup -- Nectar Teaspoon -- Nectar Cup -- Nectar Straw -- Thin Teaspoon -- Thin Cup -- Thin Straw -- Puree -- Mechanical Soft -- Regular -- Multi-consistency -- Pill Other (Comment) Cervical Esophageal Comment Suspected primary esophageal phase dysphagia Thank you, Havery Moros, CCC-SLP [phone] No flowsheet data found. PORTER,DABNEY 04/07/2017, 5:09 PM              EGD:  The examined esophagus was normal. Findings: One moderate benign-appearing, intrinsic stenosis was found 38 cm from the incisors. This measured 1.3 cm (inner diameter) x less than one cm (in length) and was traversed. A TTS dilator was passed through the scope. Dilation with an 18-19-20 mm balloon dilator was performed to 18 mm and 19 mm. The dilation site was examined and showed moderate improvement in luminal narrowing and no perforation. A 3 cm hiatal hernia was present. The entire examined stomach was normal. A single erosion without bleeding was found in the duodenal  bulb   Subjective: Patient is asleep at time of exam.  His family is all bedside and state that he had a good night.  They are wondering if hospital bed will be delivered to facility today.  They voiced concern about continuing aspiration.  They understand that aside from verbal cues and speech language pathology working with patient for speech therapy there is little else that can be done for his aspiration risk.  They voiced that they would not want to pursue any further invasive procedures such as a feeding tube.  Daughter is asking  if patient can have a nebulizer machine at time of discharge.  This is living facility came and evaluated patient on 04/10/2017 and found him no longer a candidate for assisted living.  Clinical social work was notified in skilled nursing facility placement has been sought.  Discharge Exam: Vitals:   04/10/17 0619 04/10/17 1538  BP: 132/80 131/83  Pulse: (!) 109 75  Resp: 16 18  Temp: 98.5 F (36.9 C) 98.9 F (37.2 C)  SpO2: 100% 94%   Vitals:   04/09/17 2107 04/10/17 0026 04/10/17 0619 04/10/17 1538  BP: 121/76  132/80 131/83  Pulse: (!) 107 (!) 107 (!) 109 75  Resp: 15 18 16 18   Temp: 97.6 F (36.4 C)  98.5 F (36.9 C) 98.9 F (37.2 C)  TempSrc: Oral  Oral   SpO2: 99% 98% 100% 94%  Weight:      Height:        General: Pt is asleep but in no acute distress Cardiovascular: irregularly irregular, S1/S2 +, no rubs, no gallops Respiratory: Upper airway noises heard in the upper lung fields bilaterally, no accessory muscle use, no increased work of breathing Abdominal: Soft, NT, ND, bowel sounds + Extremities: no edema, no cyanosis    The results of significant diagnostics from this hospitalization (including imaging, microbiology, ancillary and laboratory) are listed below for reference.     Microbiology: Recent Results (from the past 240 hour(s))  Culture, blood (routine x 2) Call MD if unable to obtain prior to antibiotics being given      Status: None   Collection Time: 04/04/17  7:51 PM  Result Value Ref Range Status   Specimen Description BLOOD RIGHT ARM  Final   Special Requests   Final    BOTTLES DRAWN AEROBIC ONLY Blood Culture results may not be optimal due to an inadequate volume of blood received in culture bottles   Culture NO GROWTH 5 DAYS  Final   Report Status 04/09/2017 FINAL  Final  Culture, blood (routine x 2) Call MD if unable to obtain prior to antibiotics being given     Status: None   Collection Time: 04/04/17  7:57 PM  Result Value Ref Range Status   Specimen Description LEFT ANTECUBITAL  Final   Special Requests   Final    BOTTLES DRAWN AEROBIC AND ANAEROBIC Blood Culture adequate volume   Culture NO GROWTH 5 DAYS  Final   Report Status 04/09/2017 FINAL  Final  MRSA PCR Screening     Status: None   Collection Time: 04/04/17  9:29 PM  Result Value Ref Range Status   MRSA by PCR NEGATIVE NEGATIVE Final    Comment:        The GeneXpert MRSA Assay (FDA approved for NASAL specimens only), is one component of a comprehensive MRSA colonization surveillance program. It is not intended to diagnose MRSA infection nor to guide or monitor treatment for MRSA infections.      Labs: BNP (last 3 results) No results for input(s): BNP in the last 8760 hours. Basic Metabolic Panel: Recent Labs  Lab 04/05/17 0709 04/06/17 0919 04/07/17 0453 04/08/17 0500 04/09/17 0427  NA 141 143 145 143 142  K 4.5 4.3 4.5 3.6 3.4*  CL 106 108 110 103 102  CO2 23 26 26 29 29   GLUCOSE 148* 147* 96 94 87  BUN 25* 34* 35* 30* 23*  CREATININE 1.31* 1.40* 1.14 1.05 0.92  CALCIUM 8.3* 8.6* 8.5* 8.5* 8.3*   Liver Function Tests: No results  for input(s): AST, ALT, ALKPHOS, BILITOT, PROT, ALBUMIN in the last 168 hours. No results for input(s): LIPASE, AMYLASE in the last 168 hours. No results for input(s): AMMONIA in the last 168 hours. CBC: Recent Labs  Lab 04/04/17 1512 04/05/17 0709 04/06/17 0919 04/07/17 0453  04/08/17 0500 04/09/17 0427  WBC 11.2* 12.5* 16.2* 15.5* 13.9* 10.0  NEUTROABS 5.8 11.8*  --   --   --   --   HGB 13.2 12.2* 11.7* 11.3* 11.6* 13.3  HCT 41.7 36.8* 35.9* 35.0* 36.4* 40.3  MCV 105.8* 102.2* 103.2* 103.6* 103.4* 102.8*  PLT 227 205 204 219 216 221   Cardiac Enzymes: Recent Labs  Lab 04/04/17 1512  TROPONINI <0.03   BNP: Invalid input(s): POCBNP CBG: Recent Labs  Lab 04/09/17 1155 04/09/17 1600 04/09/17 2056 04/10/17 0748 04/10/17 1121  GLUCAP 96 94 169* 85 119*   D-Dimer No results for input(s): DDIMER in the last 72 hours. Hgb A1c No results for input(s): HGBA1C in the last 72 hours. Lipid Profile No results for input(s): CHOL, HDL, LDLCALC, TRIG, CHOLHDL, LDLDIRECT in the last 72 hours. Thyroid function studies No results for input(s): TSH, T4TOTAL, T3FREE, THYROIDAB in the last 72 hours.  Invalid input(s): FREET3 Anemia work up No results for input(s): VITAMINB12, FOLATE, FERRITIN, TIBC, IRON, RETICCTPCT in the last 72 hours. Urinalysis    Component Value Date/Time   COLORURINE YELLOW 11/17/2012 0801   APPEARANCEUR HAZY (A) 11/17/2012 0801   LABSPEC 1.013 11/17/2012 0801   PHURINE 6.5 11/17/2012 0801   GLUCOSEU NEGATIVE 11/17/2012 0801   HGBUR NEGATIVE 11/17/2012 0801   BILIRUBINUR NEGATIVE 11/17/2012 0801   KETONESUR NEGATIVE 11/17/2012 0801   PROTEINUR NEGATIVE 11/17/2012 0801   UROBILINOGEN 1.0 11/17/2012 0801   NITRITE NEGATIVE 11/17/2012 0801   LEUKOCYTESUR NEGATIVE 11/17/2012 0801   Sepsis Labs Invalid input(s): PROCALCITONIN,  WBC,  LACTICIDVEN Microbiology Recent Results (from the past 240 hour(s))  Culture, blood (routine x 2) Call MD if unable to obtain prior to antibiotics being given     Status: None   Collection Time: 04/04/17  7:51 PM  Result Value Ref Range Status   Specimen Description BLOOD RIGHT ARM  Final   Special Requests   Final    BOTTLES DRAWN AEROBIC ONLY Blood Culture results may not be optimal due to an  inadequate volume of blood received in culture bottles   Culture NO GROWTH 5 DAYS  Final   Report Status 04/09/2017 FINAL  Final  Culture, blood (routine x 2) Call MD if unable to obtain prior to antibiotics being given     Status: None   Collection Time: 04/04/17  7:57 PM  Result Value Ref Range Status   Specimen Description LEFT ANTECUBITAL  Final   Special Requests   Final    BOTTLES DRAWN AEROBIC AND ANAEROBIC Blood Culture adequate volume   Culture NO GROWTH 5 DAYS  Final   Report Status 04/09/2017 FINAL  Final  MRSA PCR Screening     Status: None   Collection Time: 04/04/17  9:29 PM  Result Value Ref Range Status   MRSA by PCR NEGATIVE NEGATIVE Final    Comment:        The GeneXpert MRSA Assay (FDA approved for NASAL specimens only), is one component of a comprehensive MRSA colonization surveillance program. It is not intended to diagnose MRSA infection nor to guide or monitor treatment for MRSA infections.      Time coordinating discharge: 40 minutes  SIGNED:   Trinna Post  Christella Noa, MD  [facility] 04/10/2017, 4:05 PM Pager [phone] If 7PM-7AM, please contact night-coverage [email or web address] Password [email or web address]

## 2017-04-10 NOTE — Progress Notes (Signed)
CSW notified that [facility] will not accept pt back.  CSW completing a SNF search. Pt. Pasrr is not matching records in system. Office is closed until Monday. Pt is not able to discharge to SNF without [facility].  Budd Palmerara Vangie Henthorn LCSWA [phone]

## 2017-04-10 NOTE — Care Management (Addendum)
Contacted [facility] provider to f/u on request for insurance clearance for hospital bed.  Provider advised that insurance had not approved and could not follow up until Monday.  He stated they could deliver by lunchtime on Monday if approved Monday morning.  They can also provide DME nebulizer and suction.  Orders in for all DME.  CM Will continue to follow.

## 2017-04-11 LAB — GLUCOSE, CAPILLARY
GLUCOSE-CAPILLARY: 113 mg/dL — AB (ref 65–99)
Glucose-Capillary: 73 mg/dL (ref 65–99)

## 2017-04-11 NOTE — Progress Notes (Signed)
[facility] [place] Anticipated DC Date:04/11/17 Family Notified: Valentino HueYes, Evlyn KannerJanet French daughter [phone] Transport [facility]: [facility]   Per MD [facility] [place] . RN, patient, patient's family, and facility notified of DC. Assessment, Fl2/Pasrr, and Discharge Summary sent to facility. RN given number for report [phone]( [phone]). DC packet on chart. Ambulance transport requested for patient.   CSW signing off.  Budd Palmerara Kyleeann Cremeans LCSWA [phone]

## 2017-04-11 NOTE — Progress Notes (Signed)
CSW contacted pt's daughter Evlyn KannerJanet French [phone], [place].  [facility] [place] [facility].  [facility] [place] [facility].  Bed is available for pt.  Discharge summary and Discharge orders were sent through hub [facility] [place].    Budd Palmerara Pamala Hayman LCSWA [phone]

## 2017-04-11 NOTE — [place] FL2 (Signed)
[place] MEDICAID FL2 LEVEL OF CARE SCREENING TOOL     IDENTIFICATION  Patient Name: Aaron [name] Birthdate: 06/10/1923 Sex: male Admission Date (Current Location): 04/04/2017  [facility] [place]   Facility and Address:  [facility],  [address], [place] [id number]      Provider Number: [id number]  Attending Physician Name and Address:  Filbert SchilderKadolph, [place] U, MD  Relative Name and Phone Number:  Evlyn KannerJanet French [phone]    Current Level of Care: Hospital Recommended Level of Care: Skilled Nursing Facility Prior Approval Number:    Date Approved/Denied:   PASRR Number:    Discharge Plan: SNF    Current Diagnoses: Patient Active Problem List   Diagnosis Date Noted  . Aspiration pneumonia (HCC) 04/04/2017  . Type 2 diabetes mellitus without complication (HCC) 04/04/2017  . Essential hypertension 04/04/2017  . Permanent atrial fibrillation (HCC) 11/21/2012  . Memory loss 11/14/2012  . Mixed hyperlipidemia 09/11/2008  . CAD, NATIVE VESSEL 09/11/2008  . Aortic stenosis 09/11/2008    Orientation RESPIRATION BLADDER Height & Weight     Self  O2(nasal 3 liters) Incontinent Weight: 140 lb (63.5 kg) Height:  5\' 8"  (172.7 cm)  BEHAVIORAL SYMPTOMS/MOOD NEUROLOGICAL BOWEL NUTRITION STATUS      Continent Diet(please see dc summary)  AMBULATORY STATUS COMMUNICATION OF NEEDS Skin   Limited Assist Verbally Normal                       Personal Care Assistance Level of Assistance  Bathing, Feeding, Dressing Bathing Assistance: Maximum assistance Feeding assistance: Maximum assistance Dressing Assistance: Maximum assistance     Functional Limitations Info  Sight, Hearing Sight Info: Impaired Hearing Info: Impaired Speech Info: Adequate    SPECIAL CARE FACTORS FREQUENCY  PT (By licensed PT), Speech therapy     PT Frequency: 5 X weeks       Speech Therapy Frequency: 3 X weeks      Contractures Contractures  Info: Not present    Additional Factors Info  Allergies, Code Status, Psychotropic Code Status Info: DNR Allergies Info: Penicillins, Asa Aspirin Psychotropic Info: (depakote)         Current Medications (04/11/2017):  This is the current hospital active medication list Current Facility-Administered Medications  Medication Dose Route Frequency Provider Last Rate Last Dose  . 0.9 %  sodium chloride infusion   Intravenous Continuous Jonah BlueYates, Jennifer, MD 75 mL/hr at 04/11/17 1000    . allopurinol (ZYLOPRIM) tablet 100 mg  100 mg Oral Daily Jonah BlueYates, Jennifer, MD   100 mg at 04/11/17 0955  . ALPRAZolam Prudy Feeler(XANAX) tablet 0.25 mg  0.25 mg Oral TID PRN Jonah BlueYates, Jennifer, MD   0.25 mg at 04/10/17 2108  . divalproex (DEPAKOTE) DR tablet 125 mg  125 mg Oral Noemi ChapelQHS Yates, Jennifer, MD   125 mg at 04/10/17 2108  . donepezil (ARICEPT) tablet 10 mg  10 mg Oral Noemi ChapelQHS Yates, Jennifer, MD   10 mg at 04/10/17 2108  . famotidine (PEPCID) tablet 20 mg  20 mg Oral BID Malissa Hippoehman, Najeeb U, MD   20 mg at 04/11/17 0955  . feeding supplement (ENSURE ENLIVE) (ENSURE ENLIVE) liquid 237 mL  237 mL Oral BID Jonah BlueYates, Jennifer, MD   237 mL at 04/08/17 2143  . haloperidol lactate (HALDOL) injection 1 mg  1 mg Intravenous Q6H PRN Zigmund DanielPowell, A Caldwell Jr., MD   1 mg at 04/08/17 2141  . Influenza vac split quadrivalent PF (FLUZONE HIGH-DOSE) injection 0.5  mL  0.5 mL Intramuscular Tomorrow-1000 Zigmund DanielPowell, A Caldwell Jr., MD      . ipratropium (ATROVENT) 0.03 % nasal spray 2 spray  2 spray Each Nare BID PRN Jonah BlueYates, Jennifer, MD      . loratadine (CLARITIN) tablet 10 mg  10 mg Oral Daily Jonah BlueYates, Jennifer, MD   10 mg at 04/11/17 0959  . MEDLINE mouth rinse  15 mL Mouth Rinse BID Zigmund DanielPowell, A Caldwell Jr., MD   15 mL at 04/11/17 0955  . memantine (NAMENDA) tablet 5 mg  5 mg Oral BID Jonah BlueYates, Jennifer, MD   5 mg at 04/11/17 0955  . metoprolol tartrate (LOPRESSOR) tablet 37.5 mg  37.5 mg Oral Daily Jonah BlueYates, Jennifer, MD   37.5 mg at 04/11/17 0954  . RESOURCE  THICKENUP CLEAR   Oral PRN Zigmund DanielPowell, A Caldwell Jr., MD         Discharge Medications: Please see discharge summary for a list of discharge medications.  Relevant Imaging Results:  Relevant Lab Results:   Additional Information SSN: [id number]  Arvin Collardara W Sandhya Denherder, [place]

## 2017-04-11 NOTE — Progress Notes (Signed)
[facility] [place].  VSS and family is going to transport pt by private vehicle.

## 2017-04-11 NOTE — Progress Notes (Signed)
Agree with discharge summary from yesterday. No changes from discharge summary- [facility] [place]. Will need SLP and PT at facility.

## 2017-04-12 ENCOUNTER — Encounter (HOSPITAL_COMMUNITY): Payer: Self-pay | Admitting: Internal Medicine

## 2017-04-14 LAB — H. PYLORI ANTIBODY, IGG

## 2017-05-14 DEATH — deceased
